# Patient Record
Sex: Female | Born: 1965 | Race: White | Hispanic: No | Smoking: Current every day smoker
Health system: Southern US, Community
[De-identification: ages and names within clinical notes are randomized; demographics above are authoritative.]

## PROBLEM LIST (undated history)

## (undated) DIAGNOSIS — F1020 Alcohol dependence, uncomplicated: Secondary | ICD-10-CM

## (undated) DIAGNOSIS — F329 Major depressive disorder, single episode, unspecified: Secondary | ICD-10-CM

## (undated) DIAGNOSIS — E079 Disorder of thyroid, unspecified: Secondary | ICD-10-CM

## (undated) DIAGNOSIS — K589 Irritable bowel syndrome without diarrhea: Secondary | ICD-10-CM

## (undated) DIAGNOSIS — F32A Depression, unspecified: Secondary | ICD-10-CM

## (undated) HISTORY — DX: Disorder of thyroid, unspecified: E07.9

---

## 2012-01-14 ENCOUNTER — Ambulatory Visit: Payer: Self-pay | Admitting: Emergency Medicine

## 2012-01-14 VITALS — BP 130/82 | HR 77 | Temp 97.4°F | Resp 16 | Ht 65.75 in | Wt 153.2 lb

## 2012-01-14 DIAGNOSIS — L259 Unspecified contact dermatitis, unspecified cause: Secondary | ICD-10-CM

## 2012-01-14 DIAGNOSIS — L0390 Cellulitis, unspecified: Secondary | ICD-10-CM

## 2012-01-14 DIAGNOSIS — R21 Rash and other nonspecific skin eruption: Secondary | ICD-10-CM

## 2012-01-14 MED ORDER — HYDROCORTISONE 1 % EX LOTN: TOPICAL_LOTION | Freq: Two times a day (BID) | CUTANEOUS | Status: AC

## 2012-01-14 MED ORDER — CEPHALEXIN 500 MG PO TABS: 500.0000 mg | ORAL_TABLET | Freq: Three times a day (TID) | ORAL | Status: AC

## 2012-01-14 MED ORDER — PREDNISONE 10 MG PO TABS: ORAL_TABLET | ORAL | Status: DC

## 2012-01-14 NOTE — Progress Notes (Signed)
  Subjective:    Patient ID: Victoria Travis, female    DOB: 1965/11/22, 46 y.o.   MRN: [id number]  HPI patient enters with a pruritic rash involving the nape of her neck she states it started about 10 days ago. She does not know of any new clothes or truly sure. She is put multiple different medications on this area without improvement. She went to [facility] for a few days and this seems to have made her feel better now her rash has recurred . She has extreme pruritus. She is worsening redness and irritation. She has some isolated areas on her arms    Review of Systems patient is on thyroid medications but not on other drugs     Objective:   Physical Exam  Skin:       Pertinent physical exam as related to the neck. There is redness scaling and irritation of the anterior portion of the neck. This extends up to the underside of the chin. There no areas involving the back, the trunk or other areas          Assessment & Plan:  Patient has a severe contact dermatitis involving her neck. She has made it worse by applying multiple different medications.

## 2012-01-18 LAB — WOUND CULTURE: Gram Stain: NONE SEEN

## 2012-02-28 ENCOUNTER — Ambulatory Visit: Payer: Self-pay | Admitting: Family Medicine

## 2012-02-28 VITALS — BP 114/74 | HR 79 | Temp 98.0°F | Resp 18 | Ht 65.75 in | Wt 153.0 lb

## 2012-02-28 DIAGNOSIS — R350 Frequency of micturition: Secondary | ICD-10-CM

## 2012-02-28 DIAGNOSIS — N390 Urinary tract infection, site not specified: Secondary | ICD-10-CM

## 2012-02-28 LAB — POCT URINALYSIS DIPSTICK
Bilirubin, UA: NEGATIVE
Leukocytes, UA: NEGATIVE
Nitrite, UA: NEGATIVE
Protein, UA: NEGATIVE
Urobilinogen, UA: 0.2
pH, UA: 7

## 2012-02-28 LAB — POCT UA - MICROSCOPIC ONLY: Yeast, UA: NEGATIVE

## 2012-02-28 MED ORDER — NITROFURANTOIN MONOHYD MACRO 100 MG PO CAPS: 100.0000 mg | ORAL_CAPSULE | Freq: Two times a day (BID) | ORAL | Status: AC

## 2012-02-28 MED ORDER — PHENAZOPYRIDINE HCL 200 MG PO TABS: 200.0000 mg | ORAL_TABLET | Freq: Three times a day (TID) | ORAL | Status: AC | PRN

## 2012-02-28 NOTE — Progress Notes (Signed)
  Subjective:    Patient ID: Victoria Travis, female    DOB: 1965-10-27, 46 y.o.   MRN: [id number]  HPI  Patient presents complaining of 3 week history of cloudy urine that has a strong ammonia odor.  Complains of urgency and frequency  No nausea or vomiting No fever or chills New body products No vaginal discharge No new sexual partners  Trying to get pregnant Review of Systems     Objective:   Physical Exam  Constitutional: She appears well-developed.  Neck: Neck supple.  Cardiovascular: Normal rate and regular rhythm.   Pulmonary/Chest: Effort normal and breath sounds normal.  Abdominal: There is Tenderness: suprapubic.Marland Kitchen  Neurological: She is alert.  Skin: Skin is warm.        Assessment & Plan:  Urinary urgency Hypothyroid   Culture urine Macrobid X 3 days Pyridium X 2 days Would like to establish care at 104; information booklet provided

## 2012-03-03 LAB — URINE CULTURE

## 2012-06-11 ENCOUNTER — Ambulatory Visit: Payer: Self-pay | Admitting: Family Medicine

## 2012-06-11 VITALS — BP 110/90 | HR 86 | Temp 98.0°F | Resp 16 | Ht 65.5 in | Wt 161.4 lb

## 2012-06-11 DIAGNOSIS — L03119 Cellulitis of unspecified part of limb: Secondary | ICD-10-CM | POA: Insufficient documentation

## 2012-06-11 DIAGNOSIS — E038 Other specified hypothyroidism: Secondary | ICD-10-CM

## 2012-06-11 DIAGNOSIS — E890 Postprocedural hypothyroidism: Secondary | ICD-10-CM

## 2012-06-11 DIAGNOSIS — E039 Hypothyroidism, unspecified: Secondary | ICD-10-CM

## 2012-06-11 DIAGNOSIS — L02619 Cutaneous abscess of unspecified foot: Secondary | ICD-10-CM

## 2012-06-11 HISTORY — DX: Hypothyroidism, unspecified: E03.9

## 2012-06-11 MED ORDER — DOXYCYCLINE HYCLATE 100 MG PO TABS: 100.0000 mg | ORAL_TABLET | Freq: Two times a day (BID) | ORAL | Status: AC

## 2012-06-11 NOTE — Progress Notes (Signed)
Victoria Travis is a 46 y.o. female who presents to [facility] today for possible infection of the foot. 9 days ago the patient was stung in the foot by a wasp or ant. She noted immediate pain and swelling. Over the past several days she has had redness of the foot that is worsening. She denies any fevers or chills numbness or weakness of the foot.  She feels well otherwise.  No history of prior foot infections.  Additionally she notes that she is overdue for her thyroid panel evaluation. She has hypothyroidism following ablation of hyperthyroidism. She feels well without any hot or cold intolerance.   PMH: Reviewed significant for hypothyroidism status post hyperthyroidism History  Substance Use Topics  . Smoking status: Never Smoker   . Smokeless tobacco: Not on file  . Alcohol Use: Yes     beers nightly   ROS as above  Medications reviewed. Current Outpatient Prescriptions  Medication Sig Dispense Refill  . levothyroxine (SYNTHROID, LEVOTHROID) 200 MCG tablet Take 200 mcg by mouth daily. Pt states she is taking 1 1/2 tablets daily      . doxycycline (VIBRA-TABS) 100 MG tablet Take 1 tablet (100 mg total) by mouth 2 (two) times daily.  20 tablet  0  . hydrocortisone 1 % lotion Apply topically 2 (two) times daily.  118 mL  0  . predniSONE (DELTASONE) 10 MG tablet 3 a day for 3 days 2 a day for 3 days one a day for 2 days  18 tablet  0    Exam:  BP 110/90  Pulse 86  Temp 98 F (36.7 C) (Oral)  Resp 16  Ht 5' 5.5" (1.664 m)  Wt 161 lb 6.4 oz (73.211 kg)  BMI 26.45 kg/m2  SpO2 98% Gen: Well NAD HEENT: EOMI,  MMM Lungs: CTABL Nl WOB Heart: RRR no MRG Abd: NABS, NT, ND Exts: Non edematous BL  LE, warm and well perfused.  Skin: Erythematous area on the medial aspect of the right foot.  Not significantly tender. No induration or fluctuance. Normal foot motion sensation and capillary refill and pulses.    No results found for this or any previous visit (from the past 72  hour(s)).  Assessment and Plan: 46 y.o. female with   1) possible cellulitis the foot secondary to insect bite or standing. Plan to treat with doxycycline for 10 days.  Discussed warning signs or symptoms. Please see discharge instructions. Patient expresses understanding.  2) hypothyroidism. Currently on Synthroid. Plan to check TSH and free T4 today as she is overdue this is convenient for her.

## 2012-06-11 NOTE — Patient Instructions (Addendum)
Thank you for coming in today. 1) foot: Take the doxycycline twice a day for 10 days. Be careful for sensitivity to sun. Come back if not improving or worsening or with fevers or chills.  2) thyroid: Were drawn labs today and will be following her thyroid level.  Come back as needed.

## 2012-06-17 ENCOUNTER — Encounter: Payer: Self-pay | Admitting: *Deleted

## 2012-08-11 ENCOUNTER — Ambulatory Visit: Payer: Self-pay | Admitting: Family Medicine

## 2012-08-11 VITALS — BP 130/83 | HR 71 | Temp 98.7°F | Resp 16 | Ht 66.0 in | Wt 163.2 lb

## 2012-08-11 DIAGNOSIS — E039 Hypothyroidism, unspecified: Secondary | ICD-10-CM

## 2012-08-11 MED ORDER — LEVOTHYROXINE SODIUM 125 MCG PO TABS: 125.0000 ug | ORAL_TABLET | Freq: Every day | ORAL | Status: DC

## 2012-08-11 NOTE — Progress Notes (Signed)
46 yo woman with hypothyroidism who is now taking 150 mcg Synthroid daily (she increased the dose on her own).  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

## 2013-08-26 ENCOUNTER — Other Ambulatory Visit: Payer: Self-pay | Admitting: Family Medicine

## 2013-09-29 ENCOUNTER — Other Ambulatory Visit: Payer: Self-pay | Admitting: Physician Assistant

## 2013-09-30 ENCOUNTER — Other Ambulatory Visit: Payer: Self-pay | Admitting: Physician Assistant

## 2013-09-30 ENCOUNTER — Ambulatory Visit: Payer: Self-pay | Admitting: Family Medicine

## 2013-09-30 VITALS — BP 126/74 | HR 70 | Temp 98.8°F | Resp 18 | Ht 66.0 in | Wt 170.0 lb

## 2013-09-30 DIAGNOSIS — E039 Hypothyroidism, unspecified: Secondary | ICD-10-CM

## 2013-09-30 MED ORDER — LEVOTHYROXINE SODIUM 125 MCG PO TABS: 125.0000 ug | ORAL_TABLET | Freq: Every day | ORAL | Status: DC

## 2013-09-30 NOTE — Progress Notes (Signed)
Chief Complaint:  Chief Complaint  Patient presents with  . rx refills    levothyroxine    HPI: Victoria Travis is a 47 y.o. female who is here for  Thyroid medication refill. She is compliant with meds She is not havng any SEs. Last dose was yesterday. No cp, palpitations, weight gain/loss. Denies emotional labileness.  She does not come in for checkups every 6 months since she does not have any insurance, if there were any changes then she would come in but she cannot afford to come in every 6 months.   Past Medical History  Diagnosis Date  . Thyroid disease    History reviewed. No pertinent past surgical history. History   Social History  . Marital Status: Married    Spouse Name: N/A    Number of Children: N/A  . Years of Education: N/A   Social History Main Topics  . Smoking status: Former Games developer  . Smokeless tobacco: None  . Alcohol Use: Yes     Comment: beers nightly  . Drug Use: No  . Sexual Activity: Yes   Other Topics Concern  . None   Social History Narrative  . None   History reviewed. No pertinent family history. No Known Allergies Prior to Admission medications   Medication Sig Start Date End Date Taking? Authorizing Provider  levothyroxine (SYNTHROID, LEVOTHROID) 125 MCG tablet Take 1 tablet (125 mcg total) by mouth daily before breakfast. PATIENT NEEDS OFFICE VISIT FOR ADDITIONAL REFILLS 08/26/13  Yes Ryan M Dunn, PA-C  predniSONE (DELTASONE) 10 MG tablet 3 a day for 3 days 2 a day for 3 days one a day for 2 days 01/14/12   Collene Gobble, MD     ROS: The patient denies fevers, chills, night sweats, unintentional weight loss, chest pain, palpitations, wheezing, dyspnea on exertion, nausea, vomiting, abdominal pain, dysuria, hematuria, melena, numbness, weakness, or tingling.   All other systems have been reviewed and were otherwise negative with the exception of those mentioned in the HPI and as above.    PHYSICAL EXAM: Filed Vitals:   09/30/13 1704  BP: 126/74  Pulse: 70  Temp: 98.8 F (37.1 C)  Resp: 18   Filed Vitals:   09/30/13 1704  Height: 5\' 6"  (1.676 m)  Weight: 170 lb (77.111 kg)   Body mass index is 27.45 kg/(m^2).  General: Alert, no acute distress HEENT:  Normocephalic, atraumatic, oropharynx patent. EOMI, PERRLA, no thyroidmegaly Cardiovascular:  Regular rate and rhythm, no rubs murmurs or gallops.  No Carotid bruits, radial pulse intact. No pedal edema.  Respiratory: Clear to auscultation bilaterally.  No wheezes, rales, or rhonchi.  No cyanosis, no use of accessory musculature GI: No organomegaly, abdomen is soft and non-tender, positive bowel sounds.  No masses. Skin: No rashes. Neurologic: Facial musculature symmetric. Psychiatric: Patient is appropriate throughout our interaction. Lymphatic: No cervical lymphadenopathy Musculoskeletal: Gait intact.   LABS: Results for orders placed in visit on 06/11/12  T4, FREE      Result Value Range   Free T4 1.66  0.80 - 1.80 ng/dL  TSH      Result Value Range   TSH 0.037 (*) 0.350 - 4.500 uIU/mL     EKG/XRAY:   Primary read interpreted by Dr. Conley Rolls at [facility].   ASSESSMENT/PLAN: Encounter Diagnosis  Name Primary?  Marland Kitchen Unspecified hypothyroidism Yes   Refilled for 1 year TSH pending F/u in 6 months  Gross sideeffects, risk and benefits, and alternatives of medications  d/w patient. Patient is aware that all medications have potential sideeffects and we are unable to predict every sideeffect or drug-drug interaction that may occur.  Hamilton Capri PHUONG, DO 09/30/2013 6:07 PM

## 2013-10-01 LAB — TSH: TSH: 0.201 u[IU]/mL — ABNORMAL LOW (ref 0.350–4.500)

## 2013-10-08 ENCOUNTER — Telehealth: Payer: Self-pay | Admitting: Family Medicine

## 2013-10-08 NOTE — Telephone Encounter (Signed)
LM about TSH results, need to call me. LM with my phone number.

## 2013-10-11 ENCOUNTER — Other Ambulatory Visit: Payer: Self-pay | Admitting: Family Medicine

## 2013-10-11 DIAGNOSIS — E039 Hypothyroidism, unspecified: Secondary | ICD-10-CM

## 2013-10-11 NOTE — Telephone Encounter (Addendum)
Spoke with patient, unable to add free t4 or total t3 since outside of 1 week window when she called me back to add on test from prior blood draw. She does not have money right now to get blood work doen so will come in 1 month to get rechecked. I will not need to see her, she will call me to inform me when she gets it done. This will be a lab visit only--no charge if possible. Currently asymptomatic. She is on 125 mcg of Levothyroxine.

## 2013-12-28 ENCOUNTER — Ambulatory Visit (INDEPENDENT_AMBULATORY_CARE_PROVIDER_SITE_OTHER): Payer: BC Managed Care – PPO | Admitting: Emergency Medicine

## 2013-12-28 VITALS — BP 140/92 | HR 87 | Temp 98.7°F | Resp 16 | Ht 66.0 in | Wt 170.0 lb

## 2013-12-28 DIAGNOSIS — J309 Allergic rhinitis, unspecified: Secondary | ICD-10-CM

## 2013-12-28 DIAGNOSIS — E039 Hypothyroidism, unspecified: Secondary | ICD-10-CM

## 2013-12-28 LAB — TSH: TSH: 0.198 u[IU]/mL — AB (ref 0.350–4.500)

## 2013-12-28 LAB — T4: T4 TOTAL: 8.4 ug/dL (ref 5.0–12.5)

## 2013-12-28 LAB — T3 UPTAKE: T3 UPTAKE: 35.7 % (ref 22.5–37.0)

## 2013-12-28 LAB — T4, FREE: Free T4: 1.34 ng/dL (ref 0.80–1.80)

## 2013-12-28 MED ORDER — PSEUDOEPHEDRINE-GUAIFENESIN ER 60-600 MG PO TB12: 1.0000 | ORAL_TABLET | Freq: Two times a day (BID) | ORAL | Status: DC

## 2013-12-28 MED ORDER — TRIAMCINOLONE ACETONIDE 55 MCG/ACT NA AERO: 2.0000 | INHALATION_SPRAY | Freq: Every day | NASAL | Status: DC

## 2013-12-28 NOTE — Progress Notes (Signed)
[facility] [address], [place] [id number] [phone]  Date:  12/28/2013   Name:  Victoria DecampKaren Olazabal   DOB:  January 31, 1966   MRN:  [id number]  PCP:  No primary provider on file.    Chief Complaint: Otalgia, Sore Throat, Cough and Lab work   History of Present Illness:  Victoria DecampKaren Diaz is a 48 y.o. very pleasant female patient who presents with the following:  Has week long duration of nasal congestion with a clear watery nasal drainage, sore throat, non productive cough and pressure in her ears.  No fever or chills.  No wheezing or shortness of breath.  No nausea or vomiting.  No history of seasonal allergic rhinitis.  No improvement with over the counter medications or other home remedies. Denies other complaint or health concern today.   Patient Active Problem List   Diagnosis Date Noted  . 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 Cellulitis of foot 06/11/2012    Past Medical History  Diagnosis Date  . Thyroid disease     History reviewed. No pertinent past surgical history.  History  Substance Use Topics  . Smoking status: Former Games developermoker  . Smokeless tobacco: Not on file  . Alcohol Use: Yes     Comment: beers nightly    History reviewed. No pertinent family history.  No Known Allergies  Medication list has been reviewed and updated.  Current Outpatient Prescriptions on File Prior to Visit  Medication Sig Dispense Refill  . levothyroxine (SYNTHROID, LEVOTHROID) 125 MCG tablet Take 1 tablet (125 mcg total) by mouth daily before breakfast.  90 tablet  3   No current facility-administered medications on file prior to visit.    Review of Systems:  As per HPI, otherwise negative.    Physical Examination: Filed Vitals:   12/28/13 1252  BP: 140/92  Pulse: 87  Temp: 98.7 F (37.1 C)  Resp: 16   Filed Vitals:   12/28/13 1252  Height: 5\' 6"  (1.676 m)  Weight: 170 lb (77.111 kg)   Body mass index is 27.45 kg/(m^2). Ideal Body Weight:  Weight in (lb) to have BMI = 25: 154.6  GEN: WDWN, NAD, Non-toxic, A & O x 3 HEENT: Atraumatic, Normocephalic. Neck supple. No masses, No LAD. Ears and Nose: No external deformity. CV: RRR, No M/G/R. No JVD. No thrill. No extra heart sounds. PULM: CTA B, no wheezes, crackles, rhonchi. No retractions. No resp. distress. No accessory muscle use. ABD: S, NT, ND, +BS. No rebound. No HSM. EXTR: No c/c/e NEURO Normal gait.  PSYCH: Normally interactive. Conversant. Not depressed or anxious appearing.  Calm demeanor.    Assessment and Plan: Seasonal allergic rhinitis Eustachian tube dysfunction mucinex d nasacort   Signed,  Phillips OdorJeffery Aalaysia Liggins, MD

## 2013-12-28 NOTE — Patient Instructions (Signed)

## 2014-04-04 ENCOUNTER — Ambulatory Visit (INDEPENDENT_AMBULATORY_CARE_PROVIDER_SITE_OTHER): Payer: BC Managed Care – PPO

## 2014-04-04 ENCOUNTER — Ambulatory Visit (INDEPENDENT_AMBULATORY_CARE_PROVIDER_SITE_OTHER): Payer: BC Managed Care – PPO | Admitting: Family Medicine

## 2014-04-04 VITALS — BP 120/90 | HR 86 | Temp 98.5°F | Resp 16 | Ht 65.63 in | Wt 173.0 lb

## 2014-04-04 DIAGNOSIS — G4700 Insomnia, unspecified: Secondary | ICD-10-CM

## 2014-04-04 DIAGNOSIS — M25579 Pain in unspecified ankle and joints of unspecified foot: Secondary | ICD-10-CM

## 2014-04-04 DIAGNOSIS — L03116 Cellulitis of left lower limb: Secondary | ICD-10-CM

## 2014-04-04 DIAGNOSIS — M25572 Pain in left ankle and joints of left foot: Secondary | ICD-10-CM

## 2014-04-04 DIAGNOSIS — L02419 Cutaneous abscess of limb, unspecified: Secondary | ICD-10-CM

## 2014-04-04 DIAGNOSIS — L03119 Cellulitis of unspecified part of limb: Secondary | ICD-10-CM

## 2014-04-04 DIAGNOSIS — Z23 Encounter for immunization: Secondary | ICD-10-CM

## 2014-04-04 MED ORDER — HYDROCODONE-ACETAMINOPHEN 5-325 MG PO TABS: 1.0000 | ORAL_TABLET | Freq: Four times a day (QID) | ORAL | Status: DC | PRN

## 2014-04-04 MED ORDER — AMOXICILLIN-POT CLAVULANATE 875-125 MG PO TABS: 1.0000 | ORAL_TABLET | Freq: Two times a day (BID) | ORAL | Status: DC

## 2014-04-04 MED ORDER — TRAZODONE HCL 50 MG PO TABS: 25.0000 mg | ORAL_TABLET | Freq: Every evening | ORAL | Status: DC | PRN

## 2014-04-04 NOTE — Progress Notes (Signed)
The chart was scribed for Elvina SidleKurt Lauenstein, MD, by Yevette EdwardsAngela Bracken, ED Scribe. This patient's care was started at 3:43 PM.    Patient ID: Victoria DecampKaren Travis MRN: [id number], DOB: January 18, 1966, 48 y.o. Date of Encounter: 04/04/2014, 3:43 PM  Primary Physician: No primary provider on file.  Chief Complaint:  Chief Complaint  Patient presents with   Laceration    left foot-hit by tree limb last Saturday afternoon     HPI: 48 y.o. year old female with history below presents with pain to the dorsum of her left foot which began last week after a tree limb fell upon it. She characterizes the pain as "burning," and she reports the pain is increased with ambulation and pressure.  She denies pain to the lateral aspect of her foot or to her calfThe pt has also experienced swelling, redness, and bruising to the site.. She cannot recall what shoes, if any, she was wearing. She has used ice and elevation without resolution. She cannot recall her last tetanus.  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    Past Medical History  Diagnosis Date   Thyroid disease      Home Meds: Prior to Admission medications   Medication Sig Start Date End Date Taking? Authorizing Provider  levothyroxine (SYNTHROID, LEVOTHROID) 125 MCG tablet Take 1 tablet (125 mcg total) by mouth daily before breakfast. 09/30/13  Yes Thao P Le, DO    Allergies: No Known Allergies  History   Social History   Marital Status: Married    Spouse Name: N/A    Number of Children: N/A   Years of Education: N/A   Occupational History   Not on file.   Social History Main Topics   Smoking status: Former Smoker   Smokeless tobacco: Never Used   Alcohol Use: Yes     Comment: beers nightly   Drug Use: No   Sexual Activity: Yes   Other Topics Concern   Not on file   Social History Narrative   No narrative on file     Review of Systems: Constitutional: negative for chills, fever, night sweats, weight  changes, or fatigue  HEENT: negative for vision changes, hearing loss, congestion, rhinorrhea, ST, epistaxis, or sinus pressure Cardiovascular: negative for chest pain or palpitations Respiratory: negative for hemoptysis, wheezing, shortness of breath, or cough Abdominal: negative for abdominal pain, nausea, vomiting, diarrhea, or constipation Msk: positive left foot pain Dermatological: negative for rash Neurologic: negative for headache, dizziness, or syncope All other systems reviewed and are otherwise negative with the exception to those above and in the HPI.   Physical Exam: Blood pressure 120/90, pulse 86, temperature 98.5 F (36.9 C), temperature source Oral, resp. rate 16, height 5' 5.63" (1.667 m), weight 173 lb (78.472 kg), last menstrual period 03/07/2014, SpO2 97.00%., Body mass index is 28.24 kg/(m^2). General: Well developed, well nourished, in no acute distress. Head: Normocephalic, atraumatic, eyes without discharge, sclera non-icteric, nares are without discharge. Bilateral auditory canals clear, TM's are without perforation, pearly grey and translucent with reflective cone of light bilaterally. Oral cavity moist, posterior pharynx without exudate, erythema, peritonsillar abscess, or post nasal drip.  Neck: Supple. No thyromegaly. Full ROM. No lymphadenopathy. Lungs: Clear bilaterally to auscultation without wheezes, rales, or rhonchi. Breathing is unlabored. Heart: RRR with S1 S2. No murmurs, rubs, or gallops appreciated. Abdomen: Soft, non-tender, non-distended with normoactive bowel sounds. No hepatomegaly. No rebound/guarding. No obvious abdominal masses. Msk:  Strength and tone normal for age. Extremities/Skin:  Warm and dry. No clubbing or cyanosis. No edema. No rashes or suspicious lesions. Neuro: Alert and oriented X 3. Moves all extremities spontaneously. Gait is normal. CNII-XII grossly in tact. Psych:  Responds to questions appropriately with a normal affect.    [facility] reading (PRIMARY) by Dr. Milus GlazierLauenstein: left foot; soft-tissue swelling only     ASSESSMENT AND PLAN:  48 y.o. year old female with Pain in joint, ankle and foot, left - Plan: DG Foot Complete Left, Tdap vaccine greater than or equal to 7yo IM, amoxicillin-clavulanate (AUGMENTIN) 875-125 MG per tablet, HYDROcodone-acetaminophen (NORCO) 5-325 MG per tablet  Cellulitis of left lower extremity - Plan: amoxicillin-clavulanate (AUGMENTIN) 875-125 MG per tablet, HYDROcodone-acetaminophen (NORCO) 5-325 MG per tablet  Insomnia - Plan: traZODone (DESYREL) 50 MG tablet     Signed, Elvina SidleKurt Lauenstein, MD 04/04/2014 3:43 PM

## 2014-04-04 NOTE — Patient Instructions (Addendum)
Results for orders placed in visit on 12/28/13  TSH      Result Value Ref Range   TSH 0.198 (*) 0.350 - 4.500 uIU/mL  T3 UPTAKE      Result Value Ref Range   T3 Uptake 35.7  22.5 - 37.0 %  T4      Result Value Ref Range   T4, Total 8.4  5.0 - 12.5 ug/dL  T4, FREE      Result Value Ref Range   Free T4 1.34  0.80 - 1.80 ng/dL       Insomnia Insomnia is frequent trouble falling and/or staying asleep. Insomnia can be a long term problem or a short term problem. Both are common. Insomnia can be a short term problem when the wakefulness is related to a certain stress or worry. Long term insomnia is often related to ongoing stress during waking hours and/or poor sleeping habits. Overtime, sleep deprivation itself can make the problem worse. Every little thing feels more severe because you are overtired and your ability to cope is decreased. CAUSES   Stress, anxiety, and depression.  Poor sleeping habits.  Distractions such as TV in the bedroom.  Naps close to bedtime.  Engaging in emotionally charged conversations before bed.  Technical reading before sleep.  Alcohol and other sedatives. They may make the problem worse. They can hurt normal sleep patterns and normal dream activity.  Stimulants such as caffeine for several hours prior to bedtime.  Pain syndromes and shortness of breath can cause insomnia.  Exercise late at night.  Changing time zones may cause sleeping problems (jet lag). It is sometimes helpful to have someone observe your sleeping patterns. They should look for periods of not breathing during the night (sleep apnea). They should also look to see how long those periods last. If you live alone or observers are uncertain, you can also be observed at a sleep clinic where your sleep patterns will be professionally monitored. Sleep apnea requires a checkup and treatment. Give your caregivers your medical history. Give your caregivers observations your family has made  about your sleep.  SYMPTOMS   Not feeling rested in the morning.  Anxiety and restlessness at bedtime.  Difficulty falling and staying asleep. TREATMENT   Your caregiver may prescribe treatment for an underlying medical disorders. Your caregiver can give advice or help if you are using alcohol or other drugs for self-medication. Treatment of underlying problems will usually eliminate insomnia problems.  Medications can be prescribed for short time use. They are generally not recommended for lengthy use.  Over-the-counter sleep medicines are not recommended for lengthy use. They can be habit forming.  You can promote easier sleeping by making lifestyle changes such as:  Using relaxation techniques that help with breathing and reduce muscle tension.  Exercising earlier in the day.  Changing your diet and the time of your last meal. No night time snacks.  Establish a regular time to go to bed.  Counseling can help with stressful problems and worry.  Soothing music and white noise may be helpful if there are background noises you cannot remove.  Stop tedious detailed work at least one hour before bedtime. HOME CARE INSTRUCTIONS   Keep a diary. Inform your caregiver about your progress. This includes any medication side effects. See your caregiver regularly. Take note of:  Times when you are asleep.  Times when you are awake during the night.  The quality of your sleep.  How you feel the next  day. This information will help your caregiver care for you.  Get out of bed if you are still awake after 15 minutes. Read or do some quiet activity. Keep the lights down. Wait until you feel sleepy and go back to bed.  Keep regular sleeping and waking hours. Avoid naps.  Exercise regularly.  Avoid distractions at bedtime. Distractions include watching television or engaging in any intense or detailed activity like attempting to balance the household checkbook.  Develop a bedtime  ritual. Keep a familiar routine of bathing, brushing your teeth, climbing into bed at the same time each night, listening to soothing music. Routines increase the success of falling to sleep faster.  Use relaxation techniques. This can be using breathing and muscle tension release routines. It can also include visualizing peaceful scenes. You can also help control troubling or intruding thoughts by keeping your mind occupied with boring or repetitive thoughts like the old concept of counting sheep. You can make it more creative like imagining planting one beautiful flower after another in your backyard garden.  During your day, work to eliminate stress. When this is not possible use some of the previous suggestions to help reduce the anxiety that accompanies stressful situations. MAKE SURE YOU:   Understand these instructions.  Will watch your condition.  Will get help right away if you are not doing well or get worse. Document Released: 09/22/2000 Document Revised: 12/18/2011 Document Reviewed: 10/23/2007 [facility] Patient Information 2015 [place], [place]. This information is not intended to replace advice given to you by your health care provider. Make sure you discuss any questions you have with your health care provider. Cellulitis Cellulitis is an infection of the skin and the tissue beneath it. The infected area is usually red and tender. Cellulitis occurs most often in the arms and lower legs.  CAUSES  Cellulitis is caused by bacteria that enter the skin through cracks or cuts in the skin. The most common types of bacteria that cause cellulitis are Staphylococcus and Streptococcus. SYMPTOMS   Redness and warmth.  Swelling.  Tenderness or pain.  Fever. DIAGNOSIS  Your caregiver can usually determine what is wrong based on a physical exam. Blood tests may also be done. TREATMENT  Treatment usually involves taking an antibiotic medicine. HOME CARE INSTRUCTIONS   Take your  antibiotics as directed. Finish them even if you start to feel better.  Keep the infected arm or leg elevated to reduce swelling.  Apply a warm cloth to the affected area up to 4 times per day to relieve pain.  Only take over-the-counter or prescription medicines for pain, discomfort, or fever as directed by your caregiver.  Keep all follow-up appointments as directed by your caregiver. SEEK MEDICAL CARE IF:   You notice red streaks coming from the infected area.  Your red area gets larger or turns dark in color.  Your bone or joint underneath the infected area becomes painful after the skin has healed.  Your infection returns in the same area or another area.  You notice a swollen bump in the infected area.  You develop new symptoms. SEEK IMMEDIATE MEDICAL CARE IF:   You have a fever.  You feel very sleepy.  You develop vomiting or diarrhea.  You have a general ill feeling (malaise) with muscle aches and pains. MAKE SURE YOU:   Understand these instructions.  Will watch your condition.  Will get help right away if you are not doing well or get worse. Document Released: 07/05/2005 Document Revised:  03/26/2012 Document Reviewed: 12/11/2011 ExitCare Patient Information 2015 [place], [place]. This information is not intended to replace advice given to you by your health care provider. Make sure you discuss any questions you have with your health care provider.

## 2014-11-24 ENCOUNTER — Other Ambulatory Visit: Payer: Self-pay | Admitting: Family Medicine

## 2014-11-26 ENCOUNTER — Other Ambulatory Visit: Payer: Self-pay | Admitting: Family Medicine

## 2014-12-23 ENCOUNTER — Ambulatory Visit (INDEPENDENT_AMBULATORY_CARE_PROVIDER_SITE_OTHER): Payer: BLUE CROSS/BLUE SHIELD | Admitting: Emergency Medicine

## 2014-12-23 VITALS — BP 136/78 | HR 74 | Temp 98.4°F | Resp 17 | Ht 60.75 in | Wt 171.0 lb

## 2014-12-23 DIAGNOSIS — B351 Tinea unguium: Secondary | ICD-10-CM | POA: Diagnosis not present

## 2014-12-23 DIAGNOSIS — E039 Hypothyroidism, unspecified: Secondary | ICD-10-CM

## 2014-12-23 DIAGNOSIS — G4733 Obstructive sleep apnea (adult) (pediatric): Secondary | ICD-10-CM | POA: Diagnosis not present

## 2014-12-23 MED ORDER — TERBINAFINE HCL 250 MG PO TABS: 250.0000 mg | ORAL_TABLET | Freq: Every day | ORAL | Status: DC

## 2014-12-23 NOTE — Patient Instructions (Signed)
Sleep Apnea  Sleep apnea is a sleep disorder characterized by abnormal pauses in breathing while you sleep. When your breathing pauses, the level of oxygen in your blood decreases. This causes you to move out of deep sleep and into light sleep. As a result, your quality of sleep is poor, and the system that carries your blood throughout your body (cardiovascular system) experiences stress. If sleep apnea remains untreated, the following conditions can develop:  High blood pressure (hypertension).  Coronary artery disease.  Inability to achieve or maintain an erection (impotence).  Impairment of your thought process (cognitive dysfunction). There are three types of sleep apnea: 1. Obstructive sleep apnea--Pauses in breathing during sleep because of a blocked airway. 2. Central sleep apnea--Pauses in breathing during sleep because the area of the brain that controls your breathing does not send the correct signals to the muscles that control breathing. 3. Mixed sleep apnea--A combination of both obstructive and central sleep apnea. RISK FACTORS The following risk factors can increase your risk of developing sleep apnea:  Being overweight.  Smoking.  Having narrow passages in your nose and throat.  Being of older age.  Being female.  Alcohol use.  Sedative and tranquilizer use.  Ethnicity. Among individuals younger than 35 years, African Americans are at increased risk of sleep apnea. SYMPTOMS   Difficulty staying asleep.  Daytime sleepiness and fatigue.  Loss of energy.  Irritability.  Loud, heavy snoring.  Morning headaches.  Trouble concentrating.  Forgetfulness.  Decreased interest in sex. DIAGNOSIS  In order to diagnose sleep apnea, your caregiver will perform a physical examination. Your caregiver may suggest that you take a home sleep test. Your caregiver may also recommend that you spend the night in a sleep lab. In the sleep lab, several monitors record  information about your heart, lungs, and brain while you sleep. Your leg and arm movements and blood oxygen level are also recorded. TREATMENT The following actions may help to resolve mild sleep apnea:  Sleeping on your side.   Using a decongestant if you have nasal congestion.   Avoiding the use of depressants, including alcohol, sedatives, and narcotics.   Losing weight and modifying your diet if you are overweight. There also are devices and treatments to help open your airway:  Oral appliances. These are custom-made mouthpieces that shift your lower jaw forward and slightly open your bite. This opens your airway.  Devices that create positive airway pressure. This positive pressure "splints" your airway open to help you breathe better during sleep. The following devices create positive airway pressure:  Continuous positive airway pressure (CPAP) device. The CPAP device creates a continuous level of air pressure with an air pump. The air is delivered to your airway through a mask while you sleep. This continuous pressure keeps your airway open.  Nasal expiratory positive airway pressure (EPAP) device. The EPAP device creates positive air pressure as you exhale. The device consists of single-use valves, which are inserted into each nostril and held in place by adhesive. The valves create very little resistance when you inhale but create much more resistance when you exhale. That increased resistance creates the positive airway pressure. This positive pressure while you exhale keeps your airway open, making it easier to breath when you inhale again.  Bilevel positive airway pressure (BPAP) device. The BPAP device is used mainly in patients with central sleep apnea. This device is similar to the CPAP device because it also uses an air pump to deliver continuous air pressure   through a mask. However, with the BPAP machine, the pressure is set at two different levels. The pressure when you  exhale is lower than the pressure when you inhale.  Surgery. Typically, surgery is only done if you cannot comply with less invasive treatments or if the less invasive treatments do not improve your condition. Surgery involves removing excess tissue in your airway to create a wider passage way. Document Released: 09/15/2002 Document Revised: 01/20/2013 Document Reviewed: 02/01/2012 [employer] Patient Information 2015 [employer]. This information is not intended to replace advice given to you by your health care provider. Make sure you discuss any questions you have with your health care provider.  

## 2014-12-23 NOTE — Progress Notes (Signed)
[facility] [address], [place] [id number] [phone]  Date:  12/23/2014   Name:  Victoria DecampKaren Pizzolato   DOB:  1966/04/12   MRN:  [id number]  PCP:  No primary care provider on file.    Chief Complaint: Medication Refill; Mass; Pruritis; and Nails   History of Present Illness:  Victoria Travis is a 49 y.o. very pleasant female patient who presents with the following:  Multiple complaints.  Has hypothyroidism (acquired) and is nearly out of her medications.  Tolerating current dose with stable weight and energy Has nail disease in feet.  Now nails are delaminating and shedding. Lump (painless) on left abdominal wall over past several months Snores and has daytime sleepiness No improvement with over the counter medications or other home remedies.  Denies other complaint or health concern today.   Patient Active Problem List   Diagnosis Date Noted  . 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 Cellulitis of foot 06/11/2012    Past Medical History  Diagnosis Date  . Thyroid disease     History reviewed. No pertinent past surgical history.  History  Substance Use Topics  . Smoking status: Former Games developermoker  . Smokeless tobacco: Never Used  . Alcohol Use: Yes     Comment: beers nightly    History reviewed. No pertinent family history.  No Known Allergies  Medication list has been reviewed and updated.  Current Outpatient Prescriptions on File Prior to Visit  Medication Sig Dispense Refill  . levothyroxine (SYNTHROID, LEVOTHROID) 125 MCG tablet TAKE ONE TABLET BY MOUTH ONCE DAILY BEFORE BREAKFAST "OV NEEDED FOR ADDITIONAL REFILLS" 30 tablet 0  . amoxicillin-clavulanate (AUGMENTIN) 875-125 MG per tablet Take 1 tablet by mouth 2 (two) times daily. (Patient not taking: Reported on 12/23/2014) 20 tablet 0  . HYDROcodone-acetaminophen (NORCO) 5-325 MG per tablet Take 1 tablet by mouth every 6 (six) hours as needed for moderate pain. (Patient not taking:  Reported on 12/23/2014) 15 tablet 0  . traZODone (DESYREL) 50 MG tablet Take 0.5-1 tablets (25-50 mg total) by mouth at bedtime as needed for sleep. (Patient not taking: Reported on 12/23/2014) 30 tablet 5   No current facility-administered medications on file prior to visit.    Review of Systems:  As per HPI, otherwise negative.    Physical Examination: Filed Vitals:   12/23/14 1605  BP: 136/78  Pulse: 74  Temp: 98.4 F (36.9 C)  Resp: 17   Filed Vitals:   12/23/14 1605  Height: 5' 0.75" (1.543 m)  Weight: 171 lb (77.565 kg)   Body mass index is 32.58 kg/(m^2). Ideal Body Weight: Weight in (lb) to have BMI = 25: 131   GEN: WDWN, NAD, Non-toxic, Alert & Oriented x 3 HEENT: Atraumatic, Normocephalic.  Ears and Nose: No external deformity. EXTR: No clubbing/cyanosis/edema NEURO: Normal gait.  PSYCH: Normally interactive. Conversant. Not depressed or anxious appearing.  Calm demeanor.  SKIN:  1 cm diameter mobile mass at costal margin left abdomen NAILS:  Onychomycosis   Assessment and Plan: Onychomycosis Sebaceous cyst Acquired hypothyroidism Sleep apnea   Signed,  Phillips OdorJeffery Talea Manges, MD

## 2014-12-23 NOTE — Addendum Note (Signed)
Addended by: Maryann AlarBURNS, Garron Eline M on: 12/23/2014 05:31 PM   Modules accepted: Orders

## 2014-12-24 ENCOUNTER — Other Ambulatory Visit: Payer: Self-pay | Admitting: Emergency Medicine

## 2014-12-24 LAB — HEPATIC FUNCTION PANEL
ALBUMIN: 4.3 g/dL (ref 3.5–5.2)
ALK PHOS: 57 U/L (ref 39–117)
ALT: 50 U/L — ABNORMAL HIGH (ref 0–35)
AST: 52 U/L — ABNORMAL HIGH (ref 0–37)
Bilirubin, Direct: 0.1 mg/dL (ref 0.0–0.3)
Indirect Bilirubin: 0.2 mg/dL (ref 0.2–1.2)
TOTAL PROTEIN: 7 g/dL (ref 6.0–8.3)
Total Bilirubin: 0.3 mg/dL (ref 0.2–1.2)

## 2014-12-24 LAB — TSH: TSH: 0.91 u[IU]/mL (ref 0.350–4.500)

## 2014-12-24 MED ORDER — LEVOTHYROXINE SODIUM 125 MCG PO TABS: ORAL_TABLET | ORAL | Status: DC

## 2015-01-23 ENCOUNTER — Other Ambulatory Visit (INDEPENDENT_AMBULATORY_CARE_PROVIDER_SITE_OTHER): Payer: BLUE CROSS/BLUE SHIELD | Admitting: *Deleted

## 2015-01-23 DIAGNOSIS — B351 Tinea unguium: Secondary | ICD-10-CM

## 2015-01-23 DIAGNOSIS — G4733 Obstructive sleep apnea (adult) (pediatric): Secondary | ICD-10-CM

## 2015-01-23 DIAGNOSIS — E039 Hypothyroidism, unspecified: Secondary | ICD-10-CM

## 2015-01-23 LAB — HEPATIC FUNCTION PANEL
ALK PHOS: 54 U/L (ref 39–117)
ALT: 37 U/L — ABNORMAL HIGH (ref 0–35)
AST: 41 U/L — ABNORMAL HIGH (ref 0–37)
Albumin: 3.8 g/dL (ref 3.5–5.2)
BILIRUBIN DIRECT: 0.1 mg/dL (ref 0.0–0.3)
BILIRUBIN INDIRECT: 0.2 mg/dL (ref 0.2–1.2)
TOTAL PROTEIN: 6.7 g/dL (ref 6.0–8.3)
Total Bilirubin: 0.3 mg/dL (ref 0.2–1.2)

## 2015-01-23 NOTE — Progress Notes (Signed)
Pt here for lab draw only  

## 2015-02-23 ENCOUNTER — Ambulatory Visit (INDEPENDENT_AMBULATORY_CARE_PROVIDER_SITE_OTHER): Payer: BLUE CROSS/BLUE SHIELD | Admitting: Emergency Medicine

## 2015-02-23 VITALS — BP 122/90 | HR 76 | Temp 98.4°F | Resp 14 | Ht 66.0 in | Wt 173.4 lb

## 2015-02-23 DIAGNOSIS — G5681 Other specified mononeuropathies of right upper limb: Secondary | ICD-10-CM | POA: Diagnosis not present

## 2015-02-23 DIAGNOSIS — Z79899 Other long term (current) drug therapy: Secondary | ICD-10-CM

## 2015-02-23 LAB — HEPATIC FUNCTION PANEL
ALK PHOS: 56 U/L (ref 39–117)
ALT: 35 U/L (ref 0–35)
AST: 38 U/L — ABNORMAL HIGH (ref 0–37)
Albumin: 3.8 g/dL (ref 3.5–5.2)
BILIRUBIN TOTAL: 0.3 mg/dL (ref 0.2–1.2)
Bilirubin, Direct: 0.1 mg/dL (ref 0.0–0.3)
Indirect Bilirubin: 0.2 mg/dL (ref 0.2–1.2)
Total Protein: 6.8 g/dL (ref 6.0–8.3)

## 2015-02-23 MED ORDER — TRIAMCINOLONE ACETONIDE 40 MG/ML IJ SUSP: 40.0000 mg | Freq: Once | INTRAMUSCULAR | Status: DC

## 2015-02-23 MED ORDER — CYCLOBENZAPRINE HCL 10 MG PO TABS: 10.0000 mg | ORAL_TABLET | Freq: Three times a day (TID) | ORAL | Status: DC | PRN

## 2015-02-23 MED ORDER — NAPROXEN SODIUM 550 MG PO TABS: 550.0000 mg | ORAL_TABLET | Freq: Two times a day (BID) | ORAL | Status: DC

## 2015-02-23 MED ORDER — HYDROCODONE-ACETAMINOPHEN 5-325 MG PO TABS: 1.0000 | ORAL_TABLET | ORAL | Status: DC | PRN

## 2015-02-23 NOTE — Progress Notes (Signed)
Subjective:  Patient ID: Victoria DecampKaren Travis, female    DOB: 1966-04-27  Age: 49 y.o. MRN: [id number]  CC: Shoulder Pain and Labs Only   HPI Victoria Travis presents for the patient has pain in her right posterior shoulder. She has experienced pain for the last several days. With no history of injury or overuse. The pain is nonradiating and is not associated with any neurologic symptoms. Aggravated by lifting or carrying objects or turning her head. She wants experienced similar pain in her right shoulder associated with sleeping on different mattress and pillow.  She has attempted to relieve her pain with use of over-the-counter ibuprofen with no success. She has no other complaint of injury or illness  Outpatient Prescriptions Prior to Visit  Medication Sig Dispense Refill  . levothyroxine (SYNTHROID, LEVOTHROID) 125 MCG tablet TAKE ONE TABLET BY MOUTH ONCE DAILY BEFORE BREAKFAST 30 tablet 12  . terbinafine (LAMISIL) 250 MG tablet Take 1 tablet (250 mg total) by mouth daily. 30 tablet 5  . amoxicillin-clavulanate (AUGMENTIN) 875-125 MG per tablet Take 1 tablet by mouth 2 (two) times daily. (Patient not taking: Reported on 12/23/2014) 20 tablet 0  . HYDROcodone-acetaminophen (NORCO) 5-325 MG per tablet Take 1 tablet by mouth every 6 (six) hours as needed for moderate pain. (Patient not taking: Reported on 12/23/2014) 15 tablet 0  . traZODone (DESYREL) 50 MG tablet Take 0.5-1 tablets (25-50 mg total) by mouth at bedtime as needed for sleep. (Patient not taking: Reported on 12/23/2014) 30 tablet 5   No facility-administered medications prior to visit.    ROS Review of Systems   Constitutional: Negative for fever, chills, diaphoresis, activity change, appetite change, fatigue and unexpected weight change.  HENT: Negative for hearing loss, congestion, sore throat, rhinorrhea, sneezing, trouble swallowing, neck pain, dental problem, voice change, postnasal drip and tinnitus.  Eyes: Negative for  photophobia, pain, discharge, redness and visual disturbance.  Respiratory: Negative for cough, chest tightness, shortness of breath and wheezing.  Cardiovascular: Negative for chest pain, palpitations and leg swelling.  Gastrointestinal: Negative for nausea, abdominal pain, diarrhea, constipation and blood in stool.  Endocrine: Negative for cold intolerance, heat intolerance and polyuria.  Genitourinary: Negative for dysuria, urgency, frequency, hematuria, flank pain, vaginal bleeding, vaginal discharge, difficulty urinating, genital sores, menstrual problem and pelvic pain.  Musculoskeletal: Negative for back pain and gait problem.  Neurological: Negative for dizziness, weakness and headaches.  Hematological: Negative for adenopathy. Does not bruise/bleed easily.  Psychiatric/Behavioral: Negative for sleep disturbance, self-injury and dysphoric mood. The patient is not nervous/anxious.      Objective:  BP 122/90 mmHg  Pulse 76  Temp(Src) 98.4 F (36.9 C) (Oral)  Resp 14  Ht 5\' 6"  (1.676 m)  Wt 173 lb 6.4 oz (78.654 kg)  BMI 28.00 kg/m2  SpO2 98%  BP Readings from Last 3 Encounters:  02/23/15 122/90  12/23/14 136/78  04/04/14 120/90    Wt Readings from Last 3 Encounters:  02/23/15 173 lb 6.4 oz (78.654 kg)  12/23/14 171 lb (77.565 kg)  04/04/14 173 lb (78.472 kg)    Physical Exam   GEN: WDWN, NAD, Non-toxic, Alert & Oriented x 3 HEENT: Atraumatic, Normocephalic.  Ears and Nose: No external deformity. EXTR: No clubbing/cyanosis/edema NEURO: Normal gait.  PSYCH: Normally interactive. Conversant. Not depressed or anxious appearing.  Calm demeanor.  RIGHT shoulder:  Marked tenderness in trigger point medial to scapular angle.  Neuro intact.  Otherwise full ROM  Lab Results  Component Value Date   ALT 37*  01/23/2015   AST 41* 01/23/2015   TSH 0.910 12/23/2014      Assessment & Plan:   Victoria BraunKaren was seen today for shoulder pain and labs only.  Diagnoses and all  orders for this visit:  Scapulocostal syndrome, right Orders: -     triamcinolone acetonide (KENALOG-40) injection 40 mg; Inject 1 mL (40 mg total) into the muscle once.  Long-term use of high-risk medication Orders: -     Hepatic Function Panel  Other orders -     HYDROcodone-acetaminophen (NORCO) 5-325 MG per tablet; Take 1-2 tablets by mouth every 4 (four) hours as needed. -     cyclobenzaprine (FLEXERIL) 10 MG tablet; Take 1 tablet (10 mg total) by mouth 3 (three) times daily as needed for muscle spasms. -     naproxen sodium (ANAPROX DS) 550 MG tablet; Take 1 tablet (550 mg total) by mouth 2 (two) times daily with a meal.  I am having Victoria Travis start on HYDROcodone-acetaminophen, cyclobenzaprine, and naproxen sodium. I am also having her maintain her amoxicillin-clavulanate, HYDROcodone-acetaminophen, traZODone, terbinafine, and levothyroxine. We will continue to administer triamcinolone acetonide.  Meds ordered this encounter  Medications  . HYDROcodone-acetaminophen (NORCO) 5-325 MG per tablet    Sig: Take 1-2 tablets by mouth every 4 (four) hours as needed.    Dispense:  30 tablet    Refill:  0  . cyclobenzaprine (FLEXERIL) 10 MG tablet    Sig: Take 1 tablet (10 mg total) by mouth 3 (three) times daily as needed for muscle spasms.    Dispense:  30 tablet    Refill:  0  . naproxen sodium (ANAPROX DS) 550 MG tablet    Sig: Take 1 tablet (550 mg total) by mouth 2 (two) times daily with a meal.    Dispense:  40 tablet    Refill:  0  . triamcinolone acetonide (KENALOG-40) injection 40 mg    Sig:      Follow-up: No Follow-up on file.  Carmelina DaneAnderson, Danelia Snodgrass S, MD

## 2015-02-23 NOTE — Patient Instructions (Signed)
Shoulder strain Shoulder Pain The shoulder is the joint that connects your arms to your body. The bones that form the shoulder joint include the upper arm bone (humerus), the shoulder blade (scapula), and the collarbone (clavicle). The top of the humerus is shaped like a ball and fits into a rather flat socket on the scapula (glenoid cavity). A combination of muscles and strong, fibrous tissues that connect muscles to bones (tendons) support your shoulder joint and hold the ball in the socket. Small, fluid-filled sacs (bursae) are located in different areas of the joint. They act as cushions between the bones and the overlying soft tissues and help reduce friction between the gliding tendons and the bone as you move your arm. Your shoulder joint allows a wide range of motion in your arm. This range of motion allows you to do things like scratch your back or throw a ball. However, this range of motion also makes your shoulder more prone to pain from overuse and injury. Causes of shoulder pain can originate from both injury and overuse and usually can be grouped in the following four categories:  Redness, swelling, and pain (inflammation) of the tendon (tendinitis) or the bursae (bursitis).  Instability, such as a dislocation of the joint.  Inflammation of the joint (arthritis).  Broken bone (fracture). HOME CARE INSTRUCTIONS   Apply ice to the sore area.  Put ice in a plastic bag.  Place a towel between your skin and the bag.  Leave the ice on for 15-20 minutes, 3-4 times per day for the first 2 days, or as directed by your health care provider.  Stop using cold packs if they do not help with the pain.  If you have a shoulder sling or immobilizer, wear it as long as your caregiver instructs. Only remove it to shower or bathe. Move your arm as little as possible, but keep your hand moving to prevent swelling.  Squeeze a soft ball or foam pad as much as possible to help prevent  swelling.  Only take over-the-counter or prescription medicines for pain, discomfort, or fever as directed by your caregiver. SEEK MEDICAL CARE IF:   Your shoulder pain increases, or new pain develops in your arm, hand, or fingers.  Your hand or fingers become cold and numb.  Your pain is not relieved with medicines. SEEK IMMEDIATE MEDICAL CARE IF:   Your arm, hand, or fingers are numb or tingling.  Your arm, hand, or fingers are significantly swollen or turn white or blue. MAKE SURE YOU:   Understand these instructions.  Will watch your condition.  Will get help right away if you are not doing well or get worse. Document Released: 07/05/2005 Document Revised: 02/09/2014 Document Reviewed: 09/09/2011 [facility] Patient Information 2015 [employer] [place]. This information is not intended to replace advice given to you by your health care provider. Make sure you discuss any questions you have with your health care provider.

## 2015-02-24 ENCOUNTER — Encounter: Payer: Self-pay | Admitting: Family Medicine

## 2015-06-23 ENCOUNTER — Ambulatory Visit (INDEPENDENT_AMBULATORY_CARE_PROVIDER_SITE_OTHER): Payer: BLUE CROSS/BLUE SHIELD | Admitting: Physician Assistant

## 2015-06-23 VITALS — BP 122/80 | HR 92 | Temp 98.6°F | Resp 18 | Ht 65.5 in | Wt 170.0 lb

## 2015-06-23 DIAGNOSIS — B379 Candidiasis, unspecified: Secondary | ICD-10-CM | POA: Diagnosis not present

## 2015-06-23 DIAGNOSIS — Z79899 Other long term (current) drug therapy: Secondary | ICD-10-CM

## 2015-06-23 DIAGNOSIS — R3989 Other symptoms and signs involving the genitourinary system: Secondary | ICD-10-CM

## 2015-06-23 DIAGNOSIS — R8290 Unspecified abnormal findings in urine: Secondary | ICD-10-CM

## 2015-06-23 DIAGNOSIS — R360 Urethral discharge without blood: Secondary | ICD-10-CM | POA: Diagnosis not present

## 2015-06-23 DIAGNOSIS — M62830 Muscle spasm of back: Secondary | ICD-10-CM

## 2015-06-23 DIAGNOSIS — N390 Urinary tract infection, site not specified: Secondary | ICD-10-CM | POA: Diagnosis not present

## 2015-06-23 LAB — POCT URINALYSIS DIPSTICK
BILIRUBIN UA: NEGATIVE
Glucose, UA: NEGATIVE
Nitrite, UA: POSITIVE
PH UA: 7
Protein, UA: 30
RBC UA: NEGATIVE
Spec Grav, UA: 1.02
Urobilinogen, UA: 0.2

## 2015-06-23 LAB — POCT WET PREP WITH KOH
KOH Prep POC: POSITIVE
TRICHOMONAS UA: NEGATIVE
Yeast Wet Prep HPF POC: NEGATIVE

## 2015-06-23 LAB — POCT UA - MICROSCOPIC ONLY
CASTS, UR, LPF, POC: NEGATIVE
CRYSTALS, UR, HPF, POC: NEGATIVE
Mucus, UA: POSITIVE
Yeast, UA: NEGATIVE

## 2015-06-23 LAB — POCT URINE PREGNANCY: PREG TEST UR: NEGATIVE

## 2015-06-23 MED ORDER — METAXALONE 800 MG PO TABS: 400.0000 mg | ORAL_TABLET | Freq: Three times a day (TID) | ORAL | Status: DC

## 2015-06-23 MED ORDER — SULFAMETHOXAZOLE-TRIMETHOPRIM 800-160 MG PO TABS: 1.0000 | ORAL_TABLET | Freq: Two times a day (BID) | ORAL | Status: AC

## 2015-06-23 MED ORDER — FLUCONAZOLE 150 MG PO TABS: 150.0000 mg | ORAL_TABLET | Freq: Once | ORAL | Status: DC

## 2015-06-23 MED ORDER — MELOXICAM 7.5 MG PO TABS: 7.5000 mg | ORAL_TABLET | Freq: Every day | ORAL | Status: DC

## 2015-06-23 NOTE — Patient Instructions (Addendum)
Please take 1/2 three times per day as needed, and you can take a full tablet at night of the skelaxin. Please increase your water intake to 64 oz per day which is almost 4 regular sized water bottles.   Please schedule your physical where we can also do the pap exam.  This can be done next door at 104 with an appointment.   Do not take ibuprofen, naproxen, etc.  With the meloxicam.  You can take tylenol.    Urinary Tract Infection Urinary tract infections (UTIs) can develop anywhere along your urinary tract. Your urinary tract is your body's drainage system for removing wastes and extra water. Your urinary tract includes two kidneys, two ureters, a bladder, and a urethra. Your kidneys are a pair of bean-shaped organs. Each kidney is about the size of your fist. They are located below your ribs, one on each side of your spine. CAUSES Infections are caused by microbes, which are microscopic organisms, including fungi, viruses, and bacteria. These organisms are so small that they can only be seen through a microscope. Bacteria are the microbes that most commonly cause UTIs. SYMPTOMS  Symptoms of UTIs may vary by age and gender of the patient and by the location of the infection. Symptoms in young women typically include a frequent and intense urge to urinate and a painful, burning feeling in the bladder or urethra during urination. Older women and men are more likely to be tired, shaky, and weak and have muscle aches and abdominal pain. A fever may mean the infection is in your kidneys. Other symptoms of a kidney infection include pain in your back or sides below the ribs, nausea, and vomiting. DIAGNOSIS To diagnose a UTI, your caregiver will ask you about your symptoms. Your caregiver also will ask to provide a urine sample. The urine sample will be tested for bacteria and white blood cells. White blood cells are made by your body to help fight infection. TREATMENT  Typically, UTIs can be treated with  medication. Because most UTIs are caused by a bacterial infection, they usually can be treated with the use of antibiotics. The choice of antibiotic and length of treatment depend on your symptoms and the type of bacteria causing your infection. HOME CARE INSTRUCTIONS  If you were prescribed antibiotics, take them exactly as your caregiver instructs you. Finish the medication even if you feel better after you have only taken some of the medication.  Drink enough water and fluids to keep your urine clear or pale yellow.  Avoid caffeine, tea, and carbonated beverages. They tend to irritate your bladder.  Empty your bladder often. Avoid holding urine for long periods of time.  Empty your bladder before and after sexual intercourse.  After a bowel movement, women should cleanse from front to back. Use each tissue only once. SEEK MEDICAL CARE IF:   You have back pain.  You develop a fever.  Your symptoms do not begin to resolve within 3 days. SEEK IMMEDIATE MEDICAL CARE IF:   You have severe back pain or lower abdominal pain.  You develop chills.  You have nausea or vomiting.  You have continued burning or discomfort with urination. MAKE SURE YOU:   Understand these instructions.  Will watch your condition.  Will get help right away if you are not doing well or get worse. Document Released: 07/05/2005 Document Revised: 03/26/2012 Document Reviewed: 11/03/2011 [facility] Patient Information 2015 [place], [place]. This information is not intended to replace advice given to you  by your health care provider. Make sure you discuss any questions you have with your health care provider.  Shoulder Exercises EXERCISES  RANGE OF MOTION (ROM) AND STRETCHING EXERCISES These exercises may help you when beginning to rehabilitate your injury. Your symptoms may resolve with or without further involvement from your physician, physical therapist or athletic trainer. While completing these exercises,  remember:   Restoring tissue flexibility helps normal motion to return to the joints. This allows healthier, less painful movement and activity.  An effective stretch should be held for at least 30 seconds.  A stretch should never be painful. You should only feel a gentle lengthening or release in the stretched tissue. ROM - Pendulum  Bend at the waist so that your right / left arm falls away from your body. Support yourself with your opposite hand on a solid surface, such as a table or a countertop.  Your right / left arm should be perpendicular to the ground. If it is not perpendicular, you need to lean over farther. Relax the muscles in your right / left arm and shoulder as much as possible.  Gently sway your hips and trunk so they move your right / left arm without any use of your right / left shoulder muscles.  Progress your movements so that your right / left arm moves side to side, then forward and backward, and finally, both clockwise and counterclockwise.  Complete __________ repetitions in each direction. Many people use this exercise to relieve discomfort in their shoulder as well as to gain range of motion. Repeat __________ times. Complete this exercise __________ times per day. STRETCH - Flexion, Standing  Stand with good posture. With an underhand grip on your right / left hand and an overhand grip on the opposite hand, grasp a broomstick or cane so that your hands are a little more than shoulder-width apart.  Keeping your right / left elbow straight and shoulder muscles relaxed, push the stick with your opposite hand to raise your right / left arm in front of your body and then overhead. Raise your arm until you feel a stretch in your right / left shoulder, but before you have increased shoulder pain.  Try to avoid shrugging your right / left shoulder as your arm rises by keeping your shoulder blade tucked down and toward your mid-back spine. Hold __________  seconds.  Slowly return to the starting position. Repeat __________ times. Complete this exercise __________ times per day. STRETCH - Internal Rotation  Place your right / left hand behind your back, palm-up.  Throw a towel or belt over your opposite shoulder. Grasp the towel/belt with your right / left hand.  While keeping an upright posture, gently pull up on the towel/belt until you feel a stretch in the front of your right / left shoulder.  Avoid shrugging your right / left shoulder as your arm rises by keeping your shoulder blade tucked down and toward your mid-back spine.  Hold __________. Release the stretch by lowering your opposite hand. Repeat __________ times. Complete this exercise __________ times per day. STRETCH - External Rotation and Abduction  Stagger your stance through a doorframe. It does not matter which foot is forward.  As instructed by your physician, physical therapist or athletic trainer, place your hands:  And forearms above your head and on the door frame.  And forearms at head-height and on the door frame.  At elbow-height and on the door frame.  Keeping your head and chest upright and  your stomach muscles tight to prevent over-extending your low-back, slowly shift your weight onto your front foot until you feel a stretch across your chest and/or in the front of your shoulders.  Hold __________ seconds. Shift your weight to your back foot to release the stretch. Repeat __________ times. Complete this stretch __________ times per day.  STRENGTHENING EXERCISES  These exercises may help you when beginning to rehabilitate your injury. They may resolve your symptoms with or without further involvement from your physician, physical therapist or athletic trainer. While completing these exercises, remember:   Muscles can gain both the endurance and the strength needed for everyday activities through controlled exercises.  Complete these exercises as  instructed by your physician, physical therapist or athletic trainer. Progress the resistance and repetitions only as guided.  You may experience muscle soreness or fatigue, but the pain or discomfort you are trying to eliminate should never worsen during these exercises. If this pain does worsen, stop and make certain you are following the directions exactly. If the pain is still present after adjustments, discontinue the exercise until you can discuss the trouble with your clinician.  If advised by your physician, during your recovery, avoid activity or exercises which involve actions that place your right / left hand or elbow above your head or behind your back or head. These positions stress the tissues which are trying to heal. STRENGTH - Scapular Depression and Adduction  With good posture, sit on a firm chair. Supported your arms in front of you with pillows, arm rests or a table top. Have your elbows in line with the sides of your body.  Gently draw your shoulder blades down and toward your mid-back spine. Gradually increase the tension without tensing the muscles along the top of your shoulders and the back of your neck.  Hold for __________ seconds. Slowly release the tension and relax your muscles completely before completing the next repetition.  After you have practiced this exercise, remove the arm support and complete it in standing as well as sitting. Repeat __________ times. Complete this exercise __________ times per day.  STRENGTH - External Rotators  Secure a rubber exercise band/tubing to a fixed object so that it is at the same height as your right / left elbow when you are standing or sitting on a firm surface.  Stand or sit so that the secured exercise band/tubing is at your side that is not injured.  Bend your elbow 90 degrees. Place a folded towel or small pillow under your right / left arm so that your elbow is a few inches away from your side.  Keeping the tension  on the exercise band/tubing, pull it away from your body, as if pivoting on your elbow. Be sure to keep your body steady so that the movement is only coming from your shoulder rotating.  Hold __________ seconds. Release the tension in a controlled manner as you return to the starting position. Repeat __________ times. Complete this exercise __________ times per day.  STRENGTH - Supraspinatus  Stand or sit with good posture. Grasp a __________ weight or an exercise band/tubing so that your hand is "thumbs-up," like when you shake hands.  Slowly lift your right / left hand from your thigh into the air, traveling about 30 degrees from straight out at your side. Lift your hand to shoulder height or as far as you can without increasing any shoulder pain. Initially, many people do not lift their hands above shoulder height.  Avoid  shrugging your right / left shoulder as your arm rises by keeping your shoulder blade tucked down and toward your mid-back spine.  Hold for __________ seconds. Control the descent of your hand as you slowly return to your starting position. Repeat __________ times. Complete this exercise __________ times per day.  STRENGTH - Shoulder Extensors  Secure a rubber exercise band/tubing so that it is at the height of your shoulders when you are either standing or sitting on a firm arm-less chair.  With a thumbs-up grip, grasp an end of the band/tubing in each hand. Straighten your elbows and lift your hands straight in front of you at shoulder height. Step back away from the secured end of band/tubing until it becomes tense.  Squeezing your shoulder blades together, pull your hands down to the sides of your thighs. Do not allow your hands to go behind you.  Hold for __________ seconds. Slowly ease the tension on the band/tubing as you reverse the directions and return to the starting position. Repeat __________ times. Complete this exercise __________ times per day.  STRENGTH -  Scapular Retractors  Secure a rubber exercise band/tubing so that it is at the height of your shoulders when you are either standing or sitting on a firm arm-less chair.  With a palm-down grip, grasp an end of the band/tubing in each hand. Straighten your elbows and lift your hands straight in front of you at shoulder height. Step back away from the secured end of band/tubing until it becomes tense.  Squeezing your shoulder blades together, draw your elbows back as you bend them. Keep your upper arm lifted away from your body throughout the exercise.  Hold __________ seconds. Slowly ease the tension on the band/tubing as you reverse the directions and return to the starting position. Repeat __________ times. Complete this exercise __________ times per day. STRENGTH - Scapular Depressors  Find a sturdy chair without wheels, such as a from a dining room table.  Keeping your feet on the floor, lift your bottom from the seat and lock your elbows.  Keeping your elbows straight, allow gravity to pull your body weight down. Your shoulders will rise toward your ears.  Raise your body against gravity by drawing your shoulder blades down your back, shortening the distance between your shoulders and ears. Although your feet should always maintain contact with the floor, your feet should progressively support less body weight as you get stronger.  Hold __________ seconds. In a controlled and slow manner, lower your body weight to begin the next repetition. Repeat __________ times. Complete this exercise __________ times per day.  Document Released: 08/09/2005 Document Revised: 12/18/2011 Document Reviewed: 01/07/2009 [facility] Patient Information 2015 [place], [place]. This information is not intended to replace advice given to you by your health care provider. Make sure you discuss any questions you have with your health care provider.

## 2015-06-23 NOTE — Progress Notes (Signed)
[facility] [address], [place] [address] [phone]  Date:  06/23/2015   Name:  Victoria Travis   DOB:  Jan 24, 1966   MRN:  [id number]  PCP:  No primary care provider on file.    History of Present Illness:  Victoria Travis is a 49 y.o. female patient who presents to [facility] for chief complaint of foul smelling urinary odor for over a month. She has noticed some abdominal pain as well. There is no dysuria, frequency, or hematuria. She has noticed a change in her vaginal discharge. It is nonodorous. The genital areas nonpruritic. There is no rash. No fever, flank pain, or nausea, or vomiting. She also continues to have right shoulder pain.  She was seen here for the same shoulder pain 5 months ago. The pain has continued despite medications and the rest that she does it. 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 She has noticed no swelling or erythema. She has full mobility of this right shoulder, but his pain when she lifts her arm.  She has attempted ice, stretches, to no avail. There is some tingling along her right arm, at random.      Patient Active Problem List   Diagnosis Date Noted  . 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 Cellulitis of foot 06/11/2012    Past Medical History  Diagnosis Date  . Thyroid disease     History reviewed. No pertinent past surgical history.  Social History  Substance Use Topics  . Smoking status: Former Games developer  . Smokeless tobacco: Never Used  . Alcohol Use: Yes     Comment: beers nightly    History reviewed. No pertinent family history.  No Known Allergies  Medication list has been reviewed and updated.  Current Outpatient Prescriptions on File Prior to Visit  Medication Sig Dispense Refill  . levothyroxine (SYNTHROID, LEVOTHROID) 125 MCG tablet TAKE ONE TABLET BY MOUTH ONCE DAILY BEFORE BREAKFAST 30 tablet 12  .  terbinafine (LAMISIL) 250 MG tablet Take 1 tablet (250 mg total) by mouth daily. 30 tablet 5  . traZODone (DESYREL) 50 MG tablet Take 0.5-1 tablets (25-50 mg total) by mouth at bedtime as needed for sleep. (Patient not taking: Reported on 12/23/2014) 30 tablet 5   Current Facility-Administered Medications on File Prior to Visit  Medication Dose Route Frequency Provider Last Rate Last Dose  . triamcinolone acetonide (KENALOG-40) injection 40 mg  40 mg Intramuscular Once Carmelina Dane, MD        ROS ROS otherwise unremarkable unless listed above.   Physical Examination: BP 122/80 mmHg  Pulse 92  Temp(Src) 98.6 F (37 C) (Oral)  Resp 18  Ht 5' 5.5" (1.664 m)  Wt 170 lb (77.111 kg)  BMI 27.85 kg/m2  SpO2 97%  LMP 06/02/2015 Ideal Body Weight: Weight in (lb) to have BMI = 25: 152.2  Physical Exam  Constitutional: She is oriented to person, place, and time. She appears well-developed and well-nourished. No distress.  HENT:  Head: Normocephalic and atraumatic.  Eyes: EOM are normal. Pupils are equal, round, and reactive to light. Right eye exhibits no discharge. Left eye exhibits no discharge.  Neck: Normal range of motion. Neck supple.  Cardiovascular: Normal rate and regular rhythm.  Exam reveals no friction rub.   No murmur heard. Pulmonary/Chest: Effort normal and breath sounds normal. No respiratory distress. She has no wheezes.  Abdominal:  Soft. Bowel sounds are normal. She exhibits no distension and no mass. There is no tenderness.  Musculoskeletal:       Right shoulder: She exhibits tenderness (Tenderness along the edge of medial scapula with palpation.  Muscle spasm appreciated.  Pain with external rotation.). She exhibits normal range of motion and no deformity.  Negative Neer and Hawkins.  Negative empty can.    Neurological: She is alert and oriented to person, place, and time.  Skin: Skin is warm and dry. She is not diaphoretic.  Psychiatric: She has a normal mood and  affect. Her behavior is normal.    Results for orders placed or performed in visit on 06/23/15  POCT urinalysis dipstick  Result Value Ref Range   Color, UA yellow    Clarity, UA sl cloudy    Glucose, UA neg    Bilirubin, UA neg    Ketones, UA trace    Spec Grav, UA 1.020    Blood, UA neg    pH, UA 7.0    Protein, UA 30    Urobilinogen, UA 0.2    Nitrite, UA positive    Leukocytes, UA Trace (A) Negative  POCT UA - Microscopic Only  Result Value Ref Range   WBC, Ur, HPF, POC 0-4    RBC, urine, microscopic 0-2    Bacteria, U Microscopic moderate    Mucus, UA positive    Epithelial cells, urine per micros 0-3    Crystals, Ur, HPF, POC neg    Casts, Ur, LPF, POC neg    Yeast, UA neg   POCT urine pregnancy  Result Value Ref Range   Preg Test, Ur Negative Negative  POCT Wet Prep with KOH  Result Value Ref Range   Trichomonas, UA Negative    Clue Cells Wet Prep HPF POC 0-2    Epithelial Wet Prep HPF POC Many Few, Moderate, Many   Yeast Wet Prep HPF POC neg    Bacteria Wet Prep HPF POC Moderate (A) None, Few   RBC Wet Prep HPF POC 0-3    WBC Wet Prep HPF POC 0-4    KOH Prep POC Positive      Assessment and Plan: 49 year old female is here today with chief complaint of shoulder pain, dysuria, and abnormal vaginal discharge. -Urine culture placed -treating with bactrim -Advised diflucan at this time and repeat after abx if needed. -Shoulder pain with possible rotator cuff tear, muscle spasm, scapula syndrome.  After 5 months, pain has not improved.  At this time, ortho consult is appreciated at this time.  -Will attempt muscle relaxant she can use daily.  Advised icing, and stretches.    Urinary tract infection without hematuria, site unspecified - Plan: Urine culture, sulfamethoxazole-trimethoprim (BACTRIM DS,SEPTRA DS) 800-160 MG per tablet  Bad odor of urine - Plan: POCT urinalysis dipstick, POCT UA - Microscopic Only, POCT urine pregnancy  Abnormal urogenital  discharge - Plan: POCT urine pregnancy, POCT Wet Prep with KOH  Muscle spasm of back - Plan: metaxalone (SKELAXIN) 800 MG tablet, meloxicam (MOBIC) 7.5 MG tablet  Yeast infection - Plan: DISCONTINUED: fluconazole (DIFLUCAN) 150 MG tablet  Long-term use of high-risk medication - Plan: COMPLETE METABOLIC PANEL WITH GFR   Trena Platt, PA-C [facility] 06/24/2015 1:39 PM

## 2015-06-24 LAB — COMPLETE METABOLIC PANEL WITH GFR
ALT: 32 U/L — AB (ref 6–29)
AST: 27 U/L (ref 10–35)
Albumin: 4.1 g/dL (ref 3.6–5.1)
Alkaline Phosphatase: 60 U/L (ref 33–115)
BUN: 15 mg/dL (ref 7–25)
CHLORIDE: 99 mmol/L (ref 98–110)
CO2: 28 mmol/L (ref 20–31)
CREATININE: 0.68 mg/dL (ref 0.50–1.10)
Calcium: 9.6 mg/dL (ref 8.6–10.2)
GFR, Est Non African American: >89 mL/min (ref >=60)
Glucose, Bld: 95 mg/dL (ref 65–99)
Potassium: 4.5 mmol/L (ref 3.5–5.3)
Sodium: 136 mmol/L (ref 135–146)
Total Bilirubin: 0.4 mg/dL (ref 0.2–1.2)
Total Protein: 6.6 g/dL (ref 6.1–8.1)

## 2015-06-26 LAB — URINE CULTURE

## 2015-06-28 ENCOUNTER — Other Ambulatory Visit: Payer: Self-pay | Admitting: Physician Assistant

## 2015-06-28 DIAGNOSIS — M898X1 Other specified disorders of bone, shoulder: Secondary | ICD-10-CM

## 2015-06-28 DIAGNOSIS — M25511 Pain in right shoulder: Secondary | ICD-10-CM

## 2015-07-19 ENCOUNTER — Ambulatory Visit (INDEPENDENT_AMBULATORY_CARE_PROVIDER_SITE_OTHER): Payer: BLUE CROSS/BLUE SHIELD | Admitting: Family Medicine

## 2015-07-19 ENCOUNTER — Telehealth: Payer: Self-pay

## 2015-07-19 VITALS — BP 132/84 | HR 98 | Temp 98.6°F | Resp 18 | Ht 65.5 in | Wt 170.0 lb

## 2015-07-19 DIAGNOSIS — R8290 Unspecified abnormal findings in urine: Secondary | ICD-10-CM

## 2015-07-19 DIAGNOSIS — R350 Frequency of micturition: Secondary | ICD-10-CM | POA: Diagnosis not present

## 2015-07-19 DIAGNOSIS — R300 Dysuria: Secondary | ICD-10-CM

## 2015-07-19 LAB — POC MICROSCOPIC URINALYSIS (UMFC)

## 2015-07-19 LAB — POCT URINALYSIS DIP (MANUAL ENTRY)
BILIRUBIN UA: NEGATIVE
Glucose, UA: NEGATIVE
Leukocytes, UA: NEGATIVE
Nitrite, UA: POSITIVE — AB
PH UA: 7.5
Protein Ur, POC: NEGATIVE
RBC UA: NEGATIVE
SPEC GRAV UA: 1.015
Urobilinogen, UA: 0.2

## 2015-07-19 MED ORDER — CIPROFLOXACIN HCL 500 MG PO TABS: 500.0000 mg | ORAL_TABLET | Freq: Two times a day (BID) | ORAL | Status: DC

## 2015-07-19 NOTE — Telephone Encounter (Signed)
Unfortunately we do not reissue antibiotics.  She will need to return.

## 2015-07-19 NOTE — Patient Instructions (Signed)

## 2015-07-19 NOTE — Telephone Encounter (Signed)
Spoke with pt, advised message from [place]. Pt understood.

## 2015-07-19 NOTE — Telephone Encounter (Signed)
Pt states she was treated for a UTI and now it is back full force, have finished all her antibiotics and requesting another round. Please call [phone]     [facility] [address]

## 2015-07-19 NOTE — Progress Notes (Signed)
Subjective:    Patient ID: Victoria Travis, female    DOB: December 10, 1965, 49 y.o.   MRN: [id number] This chart was scribed for Elvina Sidle, MD by Jolene Provost, Medical Scribe. This patient was seen in Room 5 and the patient's care was started a 6:06 PM.  Chief Complaint  Patient presents with  . Urinary Tract Infection    x 4-5 days    HPI HPI Comments: Victoria Travis is a 49 y.o. female who presents to [facility] complaining of dysuria, as well as urinary frequency and urgency. She had a UTI two weeks ago and was treated with Ceptra, but a couple of days after finishing the medication her sx started to come back. She has had two UTIs in the last year. She had night sweats last night, and some weakness today. She denies back pain or fever. The pt states she missed one day of her medication last time she had a UTI.   She works at [employer] and [employer].  Review of Systems  Constitutional: Negative for fever and chills.  Gastrointestinal: Negative for abdominal pain.  Genitourinary: Positive for dysuria, urgency and frequency.       Objective:   Physical Exam  Constitutional: She is oriented to person, place, and time. She appears well-developed and well-nourished. No distress.  HENT:  Head: Normocephalic and atraumatic.  Eyes: Pupils are equal, round, and reactive to light.  Neck: Neck supple.  Cardiovascular: Normal rate.   Pulmonary/Chest: Effort normal. No respiratory distress.  Musculoskeletal: Normal range of motion.  Neurological: She is alert and oriented to person, place, and time. Coordination normal.  Skin: Skin is warm and dry. She is not diaphoretic.  Psychiatric: She has a normal mood and affect. Her behavior is normal.  Nursing note and vitals reviewed.  Results for orders placed or performed in visit on 07/19/15  POCT Microscopic Urinalysis ([facility])  Result Value Ref Range   WBC,UR,HPF,POC Few (A) None WBC/hpf   RBC,UR,HPF,POC Few (A) None RBC/hpf   Bacteria Many (A)  None   Mucus Present (A) Absent   Epithelial Cells, UR Per Microscopy Moderate (A) None cells/hpf  POCT urinalysis dipstick  Result Value Ref Range   Color, UA yellow yellow   Clarity, UA cloudy (A) clear   Glucose, UA negative negative   Bilirubin, UA negative negative   Ketones, POC UA small (15) (A) negative   Spec Grav, UA 1.015    Blood, UA negative negative   pH, UA 7.5    Protein Ur, POC negative negative   Urobilinogen, UA 0.2    Nitrite, UA Positive (A) Negative   Leukocytes, UA Negative Negative     Filed Vitals:   07/19/15 1726  BP: 132/84  Pulse: 98  Temp: 98.6 F (37 C)  Resp: 18  Height: 5' 5.5" (1.664 m)  Weight: 170 lb (77.111 kg)  SpO2: 98%      Assessment & Plan:    This chart was scribed in my presence and reviewed by me personally.    ICD-9-CM ICD-10-CM   1. Bad odor of urine 791.9 R82.90 POCT Microscopic Urinalysis ([facility])     POCT urinalysis dipstick     Urine culture     ciprofloxacin (CIPRO) 500 MG tablet  2. Dysuria 788.1 R30.0 Urine culture     ciprofloxacin (CIPRO) 500 MG tablet  3. Frequency of micturition 788.41 R35.0 Urine culture     ciprofloxacin (CIPRO) 500 MG tablet     Signed, Elvina Sidle, MD  By signing my name below, I, Javier Docker, attest that this documentation has been prepared under the direction and in the presence of Elvina Sidle, MD. Electronically Signed: Javier Docker, ER Scribe. 07/19/2015. 6:06 PM.

## 2015-07-22 LAB — URINE CULTURE: Colony Count: >=100000

## 2015-09-07 ENCOUNTER — Emergency Department (HOSPITAL_BASED_OUTPATIENT_CLINIC_OR_DEPARTMENT_OTHER)
Admission: EM | Admit: 2015-09-07 | Discharge: 2015-09-07 | Disposition: A | Discharge status: Home / Self Care | Payer: BLUE CROSS/BLUE SHIELD | Attending: Emergency Medicine | Admitting: Emergency Medicine

## 2015-09-07 ENCOUNTER — Encounter (HOSPITAL_BASED_OUTPATIENT_CLINIC_OR_DEPARTMENT_OTHER): Payer: Self-pay | Admitting: Emergency Medicine

## 2015-09-07 ENCOUNTER — Other Ambulatory Visit: Payer: Self-pay

## 2015-09-07 DIAGNOSIS — E11649 Type 2 diabetes mellitus with hypoglycemia without coma: Secondary | ICD-10-CM | POA: Insufficient documentation

## 2015-09-07 DIAGNOSIS — E162 Hypoglycemia, unspecified: Secondary | ICD-10-CM

## 2015-09-07 DIAGNOSIS — R231 Pallor: Secondary | ICD-10-CM | POA: Diagnosis not present

## 2015-09-07 DIAGNOSIS — R509 Fever, unspecified: Secondary | ICD-10-CM | POA: Insufficient documentation

## 2015-09-07 DIAGNOSIS — R002 Palpitations: Secondary | ICD-10-CM | POA: Insufficient documentation

## 2015-09-07 DIAGNOSIS — R110 Nausea: Secondary | ICD-10-CM | POA: Insufficient documentation

## 2015-09-07 DIAGNOSIS — Z3202 Encounter for pregnancy test, result negative: Secondary | ICD-10-CM | POA: Diagnosis not present

## 2015-09-07 DIAGNOSIS — E860 Dehydration: Secondary | ICD-10-CM | POA: Diagnosis not present

## 2015-09-07 DIAGNOSIS — N938 Other specified abnormal uterine and vaginal bleeding: Secondary | ICD-10-CM | POA: Diagnosis not present

## 2015-09-07 DIAGNOSIS — E079 Disorder of thyroid, unspecified: Secondary | ICD-10-CM | POA: Diagnosis not present

## 2015-09-07 DIAGNOSIS — Z79899 Other long term (current) drug therapy: Secondary | ICD-10-CM | POA: Diagnosis not present

## 2015-09-07 DIAGNOSIS — Z87891 Personal history of nicotine dependence: Secondary | ICD-10-CM | POA: Insufficient documentation

## 2015-09-07 DIAGNOSIS — R61 Generalized hyperhidrosis: Secondary | ICD-10-CM | POA: Insufficient documentation

## 2015-09-07 DIAGNOSIS — R109 Unspecified abdominal pain: Secondary | ICD-10-CM | POA: Diagnosis present

## 2015-09-07 LAB — BASIC METABOLIC PANEL
Anion gap: 18 — ABNORMAL HIGH (ref 5–15)
BUN: 14 mg/dL (ref 6–20)
CALCIUM: 8.6 mg/dL — AB (ref 8.9–10.3)
CO2: 17 mmol/L — ABNORMAL LOW (ref 22–32)
CREATININE: 0.69 mg/dL (ref 0.44–1.00)
Chloride: 103 mmol/L (ref 101–111)
Glucose, Bld: 64 mg/dL — ABNORMAL LOW (ref 65–99)
Potassium: 3.4 mmol/L — ABNORMAL LOW (ref 3.5–5.1)
SODIUM: 138 mmol/L (ref 135–145)

## 2015-09-07 LAB — URINALYSIS, ROUTINE W REFLEX MICROSCOPIC
BILIRUBIN URINE: NEGATIVE
Glucose, UA: NEGATIVE mg/dL
HGB URINE DIPSTICK: NEGATIVE
Ketones, ur: >80 mg/dL — AB
Leukocytes, UA: NEGATIVE
NITRITE: NEGATIVE
PROTEIN: NEGATIVE mg/dL
Specific Gravity, Urine: 1.019 (ref 1.005–1.030)
pH: 5 (ref 5.0–8.0)

## 2015-09-07 LAB — CBC WITH DIFFERENTIAL/PLATELET
BASOS ABS: 0 10*3/uL (ref 0.0–0.1)
BASOS PCT: 0 %
EOS ABS: 0 10*3/uL (ref 0.0–0.7)
EOS PCT: 0 %
HCT: 37.5 % (ref 36.0–46.0)
Hemoglobin: 11.6 g/dL — ABNORMAL LOW (ref 12.0–15.0)
Lymphocytes Relative: 6 %
Lymphs Abs: 0.8 10*3/uL (ref 0.7–4.0)
MCH: 25.2 pg — AB (ref 26.0–34.0)
MCHC: 30.9 g/dL (ref 30.0–36.0)
MCV: 81.5 fL (ref 78.0–100.0)
Monocytes Absolute: 0.2 10*3/uL (ref 0.1–1.0)
Monocytes Relative: 2 %
NEUTROS PCT: 92 %
Neutro Abs: 12.4 10*3/uL — ABNORMAL HIGH (ref 1.7–7.7)
PLATELETS: 412 10*3/uL — AB (ref 150–400)
RBC: 4.6 MIL/uL (ref 3.87–5.11)
RDW: 20.2 % — ABNORMAL HIGH (ref 11.5–15.5)
WBC: 13.5 10*3/uL — AB (ref 4.0–10.5)

## 2015-09-07 LAB — CBG MONITORING, ED: GLUCOSE-CAPILLARY: 67 mg/dL (ref 65–99)

## 2015-09-07 LAB — PREGNANCY, URINE: PREG TEST UR: NEGATIVE

## 2015-09-07 MED ORDER — SODIUM CHLORIDE 0.9 % IV BOLUS (SEPSIS)
1000.0000 mL | Freq: Once | INTRAVENOUS | Status: AC
  Administered 2015-09-07: 1000 mL via INTRAVENOUS

## 2015-09-07 MED ORDER — ONDANSETRON 8 MG PO TBDP: 8.0000 mg | ORAL_TABLET | Freq: Three times a day (TID) | ORAL | Status: DC | PRN

## 2015-09-07 NOTE — Discharge Instructions (Signed)
Dehydration, Adult Dehydration is a condition in which you do not have enough fluid or water in your body. It happens when you take in less fluid than you lose. Vital organs such as the kidneys, brain, and heart cannot function without a proper amount of fluids. Any loss of fluids from the body can cause dehydration.  Dehydration can range from mild to severe. This condition should be treated right away to help prevent it from becoming severe. CAUSES  This condition may be caused by:  Vomiting.  Diarrhea.  Excessive sweating, such as when exercising in hot or humid weather.  Not drinking enough fluid during strenuous exercise or during an illness.  Excessive urine output.  Fever.  Certain medicines. RISK FACTORS This condition is more likely to develop in:  People who are taking certain medicines that cause the body to lose excess fluid (diuretics).   People who have a chronic illness, such as diabetes, that may increase urination.  Older adults.   People who live at high altitudes.   People who participate in endurance sports.  SYMPTOMS  Mild Dehydration  Thirst.  Dry lips.  Slightly dry mouth.  Dry, warm skin. Moderate Dehydration  Very dry mouth.   Muscle cramps.   Dark urine and decreased urine production.   Decreased tear production.   Headache.   Light-headedness, especially when you stand up from a sitting position.  Severe Dehydration  Changes in skin.   Cold and clammy skin.   Skin does not spring back quickly when lightly pinched and released.   Changes in body fluids.   Extreme thirst.   No tears.   Not able to sweat when body temperature is high, such as in hot weather.   Minimal urine production.   Changes in vital signs.   Rapid, weak pulse (more than 100 beats per minute when you are sitting still).   Rapid breathing.   Low blood pressure.   Other changes.   Sunken eyes.   Cold hands and feet.    Confusion.  Lethargy and difficulty being awakened.  Fainting (syncope).   Short-term weight loss.   Unconsciousness. DIAGNOSIS  This condition may be diagnosed based on your symptoms. You may also have tests to determine how severe your dehydration is. These tests may include:   Urine tests.   Blood tests.  TREATMENT  Treatment for this condition depends on the severity. Mild or moderate dehydration can often be treated at home. Treatment should be started right away. Do not wait until dehydration becomes severe. Severe dehydration needs to be treated at the hospital. Treatment for Mild Dehydration  Drinking plenty of water to replace the fluid you have lost.   Replacing minerals in your blood (electrolytes) that you may have lost.  Treatment for Moderate Dehydration  Consuming oral rehydration solution (ORS). Treatment for Severe Dehydration  Receiving fluid through an IV tube.   Receiving electrolyte solution through a feeding tube that is passed through your nose and into your stomach (nasogastric tube or NG tube).  Correcting any abnormalities in electrolytes. HOME CARE INSTRUCTIONS   Drink enough fluid to keep your urine clear or pale yellow.   Drink water or fluid slowly by taking small sips. You can also try sucking on ice cubes.  Have food or beverages that contain electrolytes. Examples include bananas and sports drinks.  Take over-the-counter and prescription medicines only as told by your health care provider.   Prepare ORS according to the manufacturer's instructions. Take sips  of ORS every 5 minutes until your urine returns to normal.  If you have vomiting or diarrhea, continue to try to drink water, ORS, or both.   If you have diarrhea, avoid:   Beverages that contain caffeine.   Fruit juice.   Milk.   Carbonated soft drinks.  Do not take salt tablets. This can lead to the condition of having too much sodium in your body  (hypernatremia).  SEEK MEDICAL CARE IF:  You cannot eat or drink without vomiting.  You have had moderate diarrhea during a period of more than 24 hours.  You have a fever. SEEK IMMEDIATE MEDICAL CARE IF:   You have extreme thirst.  You have severe diarrhea.  You have not urinated in 6-8 hours, or you have urinated only a small amount of very dark urine.  You have shriveled skin.  You are dizzy, confused, or both.   This information is not intended to replace advice given to you by your health care provider. Make sure you discuss any questions you have with your health care provider.   Document Released: 09/25/2005 Document Revised: 06/16/2015 Document Reviewed: 02/10/2015 Elsevier Interactive Patient Education 2016 [employer]  Hypoglycemia Hypoglycemia occurs when the glucose in your blood is too low. Glucose is a type of sugar that is your body's main energy source. Hormones, such as insulin and glucagon, control the level of glucose in the blood. Insulin lowers blood glucose and glucagon increases blood glucose. Having too much insulin in your blood stream, or not eating enough food containing sugar, can result in hypoglycemia. Hypoglycemia can happen to people with or without diabetes. It can develop quickly and can be a medical emergency.  CAUSES   Missing or delaying meals.  Not eating enough carbohydrates at meals.  Taking too much diabetes medicine.  Not timing your oral diabetes medicine or insulin doses with meals, snacks, and exercise.  Nausea and vomiting.  Certain medicines.  Severe illnesses, such as hepatitis, kidney disorders, and certain eating disorders.  Increased activity or exercise without eating something extra or adjusting medicines.  Drinking too much alcohol.  A nerve disorder that affects body functions like your heart rate, blood pressure, and digestion (autonomic neuropathy).  A condition where the stomach muscles do not function  properly (gastroparesis). Therefore, medicines and food may not absorb properly.  Rarely, a tumor of the pancreas can produce too much insulin. SYMPTOMS   Hunger.  Sweating (diaphoresis).  Change in body temperature.  Shakiness.  Headache.  Anxiety.  Lightheadedness.  Irritability.  Difficulty concentrating.  Dry mouth.  Tingling or numbness in the hands or feet.  Restless sleep or sleep disturbances.  Altered speech and coordination.  Change in mental status.  Seizures or prolonged convulsions.  Combativeness.  Drowsiness (lethargic).  Weakness.  Increased heart rate or palpitations.  Confusion.  Pale, gray skin color.  Blurred or double vision.  Fainting. DIAGNOSIS  A physical exam and medical history will be performed. Your caregiver may make a diagnosis based on your symptoms. Blood tests and other lab tests may be performed to confirm a diagnosis. Once the diagnosis is made, your caregiver will see if your signs and symptoms go away once your blood glucose is raised.  TREATMENT  Usually, you can easily treat your hypoglycemia when you notice symptoms.  Check your blood glucose. If it is less than 70 mg/dl, take one of the following:   3-4 glucose tablets.    cup juice.    cup regular  soda.   1 cup skim milk.   -1 tube of glucose gel.   5-6 hard candies.   Avoid high-fat drinks or food that may delay a rise in blood glucose levels.  Do not take more than the recommended amount of sugary foods, drinks, gel, or tablets. Doing so will cause your blood glucose to go too high.   Wait 10-15 minutes and recheck your blood glucose. If it is still less than 70 mg/dl or below your target range, repeat treatment.   Eat a snack if it is more than 1 hour until your next meal.  There may be a time when your blood glucose may go so low that you are unable to treat yourself at home when you start to notice symptoms. You may need someone to  help you. You may even faint or be unable to swallow. If you cannot treat yourself, someone will need to bring you to the hospital.  HOME CARE INSTRUCTIONS  If you have diabetes, follow your diabetes management plan by:  Taking your medicines as directed.  Following your exercise plan.  Following your meal plan. Do not skip meals. Eat on time.  Testing your blood glucose regularly. Check your blood glucose before and after exercise. If you exercise longer or different than usual, be sure to check blood glucose more frequently.  Wearing your medical alert jewelry that says you have diabetes.  Identify the cause of your hypoglycemia. Then, develop ways to prevent the recurrence of hypoglycemia.  Do not take a hot bath or shower right after an insulin shot.  Always carry treatment with you. Glucose tablets are the easiest to carry.  If you are going to drink alcohol, drink it only with meals.  Tell friends or family members ways to keep you safe during a seizure. This may include removing hard or sharp objects from the area or turning you on your side.  Maintain a healthy weight. SEEK MEDICAL CARE IF:   You are having problems keeping your blood glucose in your target range.  You are having frequent episodes of hypoglycemia.  You feel you might be having side effects from your medicines.  You are not sure why your blood glucose is dropping so low.  You notice a change in vision or a new problem with your vision. SEEK IMMEDIATE MEDICAL CARE IF:   Confusion develops.  A change in mental status occurs.  The inability to swallow develops.  Fainting occurs.   This information is not intended to replace advice given to you by your health care provider. Make sure you discuss any questions you have with your health care provider.   Document Released: 09/25/2005 Document Revised: 09/30/2013 Document Reviewed: 06/01/2015 Elsevier Interactive Patient Education [employer]2016 Elsevier  Inc.

## 2015-09-07 NOTE — ED Provider Notes (Signed)
CSN: [id number]     Arrival date & time 09/07/15  1057 History   First MD Initiated Contact with Patient 09/07/15 1125     Chief Complaint  Patient presents with  . Abdominal Pain     (Consider location/radiation/quality/duration/timing/severity/associated sxs/prior Treatment) Patient is a 49 y.o. female presenting with abdominal pain. The history is provided by the patient.  Abdominal Pain Associated symptoms: fever, nausea and vaginal bleeding   Associated symptoms: no chest pain, no diarrhea, no shortness of breath and no vomiting    patient presents after feeling bad for last few days. States she cannot really quantify but was feeling bad. States she began to feel worse today. She woke up very sweaty and felt weak all over. States she's also had some fluttering in her uterus. She's had some nausea without vomiting. No dysuria. lower abdominal pain. States she felt as if her heart was going fast. States she was pale and cool. States she is feeling somewhat better now. Denies possibility of pregnancy. States she normally has. Heavy menses but his been that way for years. States her last one finished up a couple days ago. Some constipation.  Past Medical History  Diagnosis Date  . Thyroid disease    History reviewed. No pertinent past surgical history. History reviewed. No pertinent family history. Social History  Substance Use Topics  . Smoking status: Former Games developermoker  . Smokeless tobacco: Never Used  . Alcohol Use: 2.4 oz/week    4 Standard drinks or equivalent per week     Comment: beers nightly   OB History    No data available     Review of Systems  Constitutional: Positive for fever, diaphoresis, activity change and appetite change.  Eyes: Negative for pain.  Respiratory: Negative for chest tightness and shortness of breath.   Cardiovascular: Positive for palpitations. Negative for chest pain and leg swelling.  Gastrointestinal: Positive for nausea and abdominal pain.  Negative for vomiting and diarrhea.  Genitourinary: Positive for vaginal bleeding. Negative for flank pain.       The patient states her uterus feels fluttery  Musculoskeletal: Negative for back pain and neck stiffness.  Skin: Positive for pallor. Negative for rash.  Neurological: Negative for weakness, numbness and headaches.  Psychiatric/Behavioral: Negative for behavioral problems.      Allergies  Review of patient's allergies indicates no known allergies.  Home Medications   Prior to Admission medications   Medication Sig Start Date End Date Taking? Authorizing Provider  levothyroxine (SYNTHROID, LEVOTHROID) 125 MCG tablet TAKE ONE TABLET BY MOUTH ONCE DAILY BEFORE BREAKFAST 12/24/14  Yes Carmelina DaneJeffery S Anderson, MD  ondansetron (ZOFRAN-ODT) 8 MG disintegrating tablet Take 1 tablet (8 mg total) by mouth every 8 (eight) hours as needed for nausea or vomiting. 09/07/15   Benjiman CoreNathan Symir Mah, MD   BP 137/81 mmHg  Pulse 71  Temp(Src) 98.6 F (37 C) (Oral)  Resp 18  Wt 175 lb (79.379 kg)  SpO2 100%  LMP 08/31/2015 Physical Exam  Constitutional: She appears well-developed.  HENT:  Head: Normocephalic.  Eyes: Pupils are equal, round, and reactive to light.  Neck: Neck supple.  Cardiovascular: Normal rate.   Pulmonary/Chest: Effort normal.  Abdominal: Soft.  Mild lower abdominal tenderness without rebound or guarding.  Musculoskeletal: Normal range of motion.  Neurological: She is alert.    ED Course  Procedures (including critical care time) Labs Review Labs Reviewed  URINALYSIS, ROUTINE W REFLEX MICROSCOPIC (NOT AT [facility]) - Abnormal; Notable for the following:  APPearance CLOUDY (*)    Ketones, ur >80 (*)    All other components within normal limits  BASIC METABOLIC PANEL - Abnormal; Notable for the following:    Potassium 3.4 (*)    CO2 17 (*)    Glucose, Bld 64 (*)    Calcium 8.6 (*)    Anion gap 18 (*)    All other components within normal limits  CBC WITH  DIFFERENTIAL/PLATELET - Abnormal; Notable for the following:    WBC 13.5 (*)    Hemoglobin 11.6 (*)    MCH 25.2 (*)    RDW 20.2 (*)    Platelets 412 (*)    Neutro Abs 12.4 (*)    All other components within normal limits  PREGNANCY, URINE  CBG MONITORING, ED    Imaging Review No results found. I have personally reviewed and evaluated these images and lab results as part of my medical decision-making.   EKG Interpretation   Date/Time:  Tuesday September 07 2015 11:41:29 EST Ventricular Rate:  70 PR Interval:  144 QRS Duration: 82 QT Interval:  438 QTC Calculation: 473 R Axis:   78 Text Interpretation:  Normal sinus rhythm Normal ECG Confirmed by  Rubin Payor  MD, Harrold Donath [phone]) on 09/07/2015 12:28:58 PM      MDM   Final diagnoses:  Dehydration  Hypoglycemia   patient with abdominal pain and diaphoresis. Lab work reassuring. Did have slight hypoglycemia. Feels much better after IV fluids. Does have history of thyroid issues. Will need follow-up. Will discharge home.  Benjiman Core, MD 09/08/15 [phone]

## 2015-09-07 NOTE — ED Notes (Signed)
Pt reports that she has felt like she has had butterflies in her abdomen and awoke this am sweating, denies sob or n/v/d

## 2015-09-07 NOTE — ED Notes (Signed)
MD at bedside. 

## 2015-09-07 NOTE — ED Notes (Signed)
Per ems cbg was 69 and orthostatic vs were negative

## 2015-09-09 ENCOUNTER — Ambulatory Visit (INDEPENDENT_AMBULATORY_CARE_PROVIDER_SITE_OTHER): Payer: BLUE CROSS/BLUE SHIELD | Admitting: Emergency Medicine

## 2015-09-09 VITALS — BP 122/80 | HR 91 | Temp 98.0°F | Resp 16 | Ht 65.0 in | Wt 171.0 lb

## 2015-09-09 DIAGNOSIS — K2990 Gastroduodenitis, unspecified, without bleeding: Secondary | ICD-10-CM

## 2015-09-09 DIAGNOSIS — K2970 Gastritis, unspecified, without bleeding: Secondary | ICD-10-CM | POA: Diagnosis not present

## 2015-09-09 DIAGNOSIS — J020 Streptococcal pharyngitis: Secondary | ICD-10-CM | POA: Diagnosis not present

## 2015-09-09 MED ORDER — LANSOPRAZOLE 30 MG PO CPDR: 30.0000 mg | DELAYED_RELEASE_CAPSULE | Freq: Every day | ORAL | Status: DC

## 2015-09-09 MED ORDER — SUCRALFATE 1 G PO TABS: ORAL_TABLET | ORAL | Status: DC

## 2015-09-09 MED ORDER — PENICILLIN V POTASSIUM 500 MG PO TABS: 500.0000 mg | ORAL_TABLET | Freq: Four times a day (QID) | ORAL | Status: DC

## 2015-09-09 NOTE — Patient Instructions (Signed)

## 2015-09-09 NOTE — Progress Notes (Signed)
Subjective:  Patient ID: Victoria Travis, female    DOB: Jun 17, 1966  Age: 49 y.o. MRN: [id number]  CC: Sore Throat; Referral; and inside mouth swollen   HPI Victoria Travis presents   Patient has 2 complaints. She has sore throat and dysphagia. She has a low-grade fever. She has no nausea vomiting or stool change. She has no rash. No nasal congestion or postnasal drainage. No known cough wheezing or shortness of breath. She has a history of recurrent  Heartburn with radiation of pain into her chest. She has no nausea or vomiting or blood from her GI tract. She's been unable to control her symptoms with over-the-counter medication. She doesn't abuse alcohol or caffeine. She doesn't use excessive  Aspirin or nonsteroidal anti-inflammatories. She has food intolerance of greasy and fried food.  History Victoria Travis has a past medical history of Thyroid disease.   She has no past surgical history on file.   Her  family history is not on file.  She   reports that she has quit smoking. She has never used smokeless tobacco. She reports that she drinks about 2.4 oz of alcohol per week. She reports that she does not use illicit drugs.  Outpatient Prescriptions Prior to Visit  Medication Sig Dispense Refill  . levothyroxine (SYNTHROID, LEVOTHROID) 125 MCG tablet TAKE ONE TABLET BY MOUTH ONCE DAILY BEFORE BREAKFAST 30 tablet 12  . ondansetron (ZOFRAN-ODT) 8 MG disintegrating tablet Take 1 tablet (8 mg total) by mouth every 8 (eight) hours as needed for nausea or vomiting. 8 tablet 0   Facility-Administered Medications Prior to Visit  Medication Dose Route Frequency Provider Last Rate Last Dose  . triamcinolone acetonide (KENALOG-40) injection 40 mg  40 mg Intramuscular Once Carmelina Dane, MD        Social History   Social History  . Marital Status: Married    Spouse Name: N/A  . Number of Children: N/A  . Years of Education: N/A   Social History Main Topics  . Smoking status: Former Games developer    . Smokeless tobacco: Never Used  . Alcohol Use: 2.4 oz/week    4 Standard drinks or equivalent per week     Comment: beers nightly  . Drug Use: No  . Sexual Activity: Yes   Other Topics Concern  . None   Social History Narrative     Review of Systems  Constitutional: Negative for fever, chills and appetite change.  HENT: Positive for sore throat. Negative for congestion, ear pain, postnasal drip and sinus pressure.   Eyes: Negative for pain and redness.  Respiratory: Negative for cough, shortness of breath and wheezing.   Cardiovascular: Negative for leg swelling.  Gastrointestinal: Positive for nausea and abdominal pain. Negative for vomiting, diarrhea, constipation and blood in stool.  Endocrine: Negative for polyuria.  Genitourinary: Negative for dysuria, urgency, frequency and flank pain.  Musculoskeletal: Negative for gait problem.  Skin: Negative for rash.  Neurological: Negative for weakness and headaches.  Psychiatric/Behavioral: Negative for confusion and decreased concentration. The patient is not nervous/anxious.     Objective:  BP 122/80 mmHg  Pulse 91  Temp(Src) 98 F (36.7 C) (Oral)  Resp 16  Ht  (1.651 m)  Wt 171 lb (77.565 kg)  BMI 28.46 kg/m2  SpO2 97%  LMP 08/31/2015  Physical Exam  Constitutional: She is oriented to person, place, and time. She appears well-developed and well-nourished. No distress.  HENT:  Head: Normocephalic and atraumatic.  Right Ear: External ear normal.  Left Ear: External ear normal.  Nose: Nose normal.  Mouth/Throat: Posterior oropharyngeal edema and posterior oropharyngeal erythema present.  Eyes: Conjunctivae and EOM are normal. Pupils are equal, round, and reactive to light. No scleral icterus.  Neck: Normal range of motion. Neck supple. No tracheal deviation present.  Cardiovascular: Normal rate, regular rhythm and normal heart sounds.   Pulmonary/Chest: Effort normal. No respiratory distress. She has no  wheezes. She has no rales.  Abdominal: She exhibits no mass. There is no tenderness. There is no rebound and no guarding.  Musculoskeletal: She exhibits no edema.  Lymphadenopathy:    She has no cervical adenopathy.  Neurological: She is alert and oriented to person, place, and time. Coordination normal.  Skin: Skin is warm and dry. No rash noted.  Psychiatric: She has a normal mood and affect. Her behavior is normal.      Assessment & Plan:   Victoria Travis was seen today for sore throat, referral and inside mouth swollen.  Diagnoses and all orders for this visit:  Acute streptococcal pharyngitis -     Comprehensive metabolic panel -     CBC -     H. pylori breath test  Gastritis and gastroduodenitis -     Comprehensive metabolic panel -     CBC -     H. pylori breath test  Other orders -     penicillin v potassium (VEETID) 500 MG tablet; Take 1 tablet (500 mg total) by mouth 4 (four) times daily. -     lansoprazole (PREVACID) 30 MG capsule; Take 1 capsule (30 mg total) by mouth daily at 12 noon. -     sucralfate (CARAFATE) 1 G tablet; 1 tablet 1 hr ac and hs  I am having Victoria Travis start on penicillin v potassium, lansoprazole, and sucralfate. I am also having her maintain her levothyroxine and ondansetron. We will continue to administer triamcinolone acetonide.  Meds ordered this encounter  Medications  . penicillin v potassium (VEETID) 500 MG tablet    Sig: Take 1 tablet (500 mg total) by mouth 4 (four) times daily.    Dispense:  40 tablet    Refill:  0  . lansoprazole (PREVACID) 30 MG capsule    Sig: Take 1 capsule (30 mg total) by mouth daily at 12 noon.    Dispense:  30 capsule    Refill:  5  . sucralfate (CARAFATE) 1 G tablet    Sig: 1 tablet 1 hr ac and hs    Dispense:  120 tablet    Refill:  0    Appropriate red flag conditions were discussed with the patient as well as actions that should be taken.  Patient expressed his understanding.  Follow-up: Return if  symptoms worsen or fail to improve.  Carmelina DaneAnderson, Tria Noguera S, MD

## 2015-09-10 LAB — COMPREHENSIVE METABOLIC PANEL
ALBUMIN: 4.3 g/dL (ref 3.6–5.1)
ALT: 114 U/L — ABNORMAL HIGH (ref 6–29)
AST: 130 U/L — AB (ref 10–35)
Alkaline Phosphatase: 69 U/L (ref 33–115)
BILIRUBIN TOTAL: 0.5 mg/dL (ref 0.2–1.2)
BUN: 11 mg/dL (ref 7–25)
CHLORIDE: 98 mmol/L (ref 98–110)
CO2: 27 mmol/L (ref 20–31)
CREATININE: 0.72 mg/dL (ref 0.50–1.10)
Calcium: 9.2 mg/dL (ref 8.6–10.2)
Glucose, Bld: 99 mg/dL (ref 65–99)
Potassium: 4.3 mmol/L (ref 3.5–5.3)
SODIUM: 136 mmol/L (ref 135–146)
TOTAL PROTEIN: 7.6 g/dL (ref 6.1–8.1)

## 2015-09-10 LAB — CBC
HEMATOCRIT: 35.5 % — AB (ref 36.0–46.0)
Hemoglobin: 11.5 g/dL — ABNORMAL LOW (ref 12.0–15.0)
MCH: 25.9 pg — ABNORMAL LOW (ref 26.0–34.0)
MCHC: 32.4 g/dL (ref 30.0–36.0)
MCV: 80 fL (ref 78.0–100.0)
MPV: 9.5 fL (ref 8.6–12.4)
PLATELETS: 392 10*3/uL (ref 150–400)
RBC: 4.44 MIL/uL (ref 3.87–5.11)
RDW: 19.4 % — AB (ref 11.5–15.5)
WBC: 6.7 10*3/uL (ref 4.0–10.5)

## 2015-09-10 LAB — H. PYLORI BREATH TEST: H. pylori Breath Test: NOT DETECTED

## 2015-09-11 NOTE — Addendum Note (Signed)
Addended by: Carmelina DaneANDERSON, Yocelin Vanlue S on: 09/11/2015 08:35 AM   Modules accepted: Orders, SmartSet

## 2015-09-13 ENCOUNTER — Encounter: Payer: Self-pay | Admitting: Physician Assistant

## 2015-09-13 ENCOUNTER — Ambulatory Visit (INDEPENDENT_AMBULATORY_CARE_PROVIDER_SITE_OTHER): Payer: BLUE CROSS/BLUE SHIELD | Admitting: Physician Assistant

## 2015-09-13 VITALS — BP 143/85 | HR 90 | Temp 99.4°F | Resp 16 | Ht 65.75 in | Wt 171.0 lb

## 2015-09-13 DIAGNOSIS — Z0000 Encounter for general adult medical examination without abnormal findings: Secondary | ICD-10-CM | POA: Diagnosis not present

## 2015-09-13 DIAGNOSIS — Z113 Encounter for screening for infections with a predominantly sexual mode of transmission: Secondary | ICD-10-CM | POA: Diagnosis not present

## 2015-09-13 DIAGNOSIS — Z130 Encounter for screening for diseases of the blood and blood-forming organs and certain disorders involving the immune mechanism: Secondary | ICD-10-CM

## 2015-09-13 DIAGNOSIS — Z13220 Encounter for screening for lipoid disorders: Secondary | ICD-10-CM

## 2015-09-13 DIAGNOSIS — Z124 Encounter for screening for malignant neoplasm of cervix: Secondary | ICD-10-CM

## 2015-09-13 DIAGNOSIS — E039 Hypothyroidism, unspecified: Secondary | ICD-10-CM

## 2015-09-13 DIAGNOSIS — Z1231 Encounter for screening mammogram for malignant neoplasm of breast: Secondary | ICD-10-CM

## 2015-09-13 DIAGNOSIS — Z13228 Encounter for screening for other metabolic disorders: Secondary | ICD-10-CM | POA: Diagnosis not present

## 2015-09-13 LAB — COMPLETE METABOLIC PANEL WITH GFR
ALT: 71 U/L — ABNORMAL HIGH (ref 6–29)
AST: 56 U/L — ABNORMAL HIGH (ref 10–35)
Albumin: 4.2 g/dL (ref 3.6–5.1)
Alkaline Phosphatase: 66 U/L (ref 33–115)
BUN: 7 mg/dL (ref 7–25)
CO2: 26 mmol/L (ref 20–31)
Calcium: 8.4 mg/dL — ABNORMAL LOW (ref 8.6–10.2)
Chloride: 101 mmol/L (ref 98–110)
Creat: 0.65 mg/dL (ref 0.50–1.10)
GLUCOSE: 63 mg/dL — AB (ref 65–99)
POTASSIUM: 4.3 mmol/L (ref 3.5–5.3)
SODIUM: 138 mmol/L (ref 135–146)
Total Bilirubin: 0.4 mg/dL (ref 0.2–1.2)
Total Protein: 6.7 g/dL (ref 6.1–8.1)

## 2015-09-13 LAB — LIPID PANEL
CHOL/HDL RATIO: 2 ratio (ref <=5.0)
CHOLESTEROL: 217 mg/dL — AB (ref 125–200)
HDL: 108 mg/dL (ref >=46)
LDL Cholesterol: 94 mg/dL (ref <130)
Triglycerides: 73 mg/dL (ref <150)
VLDL: 15 mg/dL (ref <30)

## 2015-09-13 LAB — TSH: TSH: 4.408 u[IU]/mL (ref 0.350–4.500)

## 2015-09-13 LAB — CBC
HCT: 34.7 % — ABNORMAL LOW (ref 36.0–46.0)
Hemoglobin: 10.6 g/dL — ABNORMAL LOW (ref 12.0–15.0)
MCH: 25 pg — AB (ref 26.0–34.0)
MCHC: 30.5 g/dL (ref 30.0–36.0)
MCV: 81.8 fL (ref 78.0–100.0)
MPV: 10.1 fL (ref 8.6–12.4)
Platelets: 406 10*3/uL — ABNORMAL HIGH (ref 150–400)
RBC: 4.24 MIL/uL (ref 3.87–5.11)
RDW: 20.1 % — ABNORMAL HIGH (ref 11.5–15.5)
WBC: 4.6 10*3/uL (ref 4.0–10.5)

## 2015-09-13 NOTE — Progress Notes (Signed)
[facility] [address], [place] [id number] [phone]  Date:  09/13/2015   Name:  Victoria DecampKaren Travis   DOB:  [birth year]   MRN:  [id number]  PCP:  No PCP Per Patient    History of Present Illness:  Victoria Travis is a 49 y.o. female patient who [facility]la [facility].   She has no concerns or complaints at this time.    Diet: soups, vegetables, chicken.  Low on pastas and sugary stuff.    Bm: regular, no blood in the stool, constipation, diarrhea.    Urination: normal, no dysuria, hematuria, or frequency.  Sleep: goes to sleep fine, wakes at 2:30am and can not sleep, sleep exhaustion, but is up at 5:30am.  She will take sleeping pills every now and then.     --hospitality [facility].   --exercise: once every other week.    Etoh: 3 cocktails in the evening at home Illicit drug use: none Tobacco use: none    Menses has worsened over the last 6 years.  Described as "horrific", she has 5 days of heavy bleeding, and 3 days of full cramping.  Bright red clots.  Uses tampons only.  She has not used ocp since 2008.  Sexual activity with husband with family planning.  She was not able to get pregant.  She was followed by a fertility clinic, who found cyst on ovaries.  She does not recall ever having a  Pelvic pain: not painful, low cramping, mild little pings of pain.  Every day.  Walking     Patient Active Problem List   Diagnosis Date Noted  . 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 Cellulitis of foot 06/11/2012    Past Medical History  Diagnosis Date  . Thyroid disease     History reviewed. No pertinent past surgical history.  Social History  Substance Use Topics  . Smoking status: Former Games developermoker  . Smokeless tobacco: Never Used  . Alcohol Use: 2.4 oz/week    4 Standard drinks or equivalent per week     Comment: beers nightly    History reviewed. No pertinent family history.  No Known Allergies  Medication  list has been reviewed and updated.  Current Outpatient Prescriptions on File Prior to Visit  Medication Sig Dispense Refill  . lansoprazole (PREVACID) 30 MG capsule Take 1 capsule (30 mg total) by mouth daily at 12 noon. 30 capsule 5  . levothyroxine (SYNTHROID, LEVOTHROID) 125 MCG tablet TAKE ONE TABLET BY MOUTH ONCE DAILY BEFORE BREAKFAST 30 tablet 12  . penicillin v potassium (VEETID) 500 MG tablet Take 1 tablet (500 mg total) by mouth 4 (four) times daily. 40 tablet 0  . sucralfate (CARAFATE) 1 G tablet 1 tablet 1 hr ac and hs 120 tablet 0  . ondansetron (ZOFRAN-ODT) 8 MG disintegrating tablet Take 1 tablet (8 mg total) by mouth every 8 (eight) hours as needed for nausea or vomiting. (Patient not taking: Reported on 09/13/2015) 8 tablet 0   Current Facility-Administered Medications on File Prior to Visit  Medication Dose Route Frequency Provider Last Rate Last Dose  . triamcinolone acetonide (KENALOG-40) injection 40 mg  40 mg Intramuscular Once Carmelina DaneJeffery S Anderson, MD        Review of Systems  Constitutional: Negative for fever and chills.  HENT: Negative for ear discharge, ear pain and sore throat.   Eyes: Negative for blurred vision and double vision.  Respiratory: Negative for cough, shortness  of breath and wheezing.   Cardiovascular: Negative for chest pain, palpitations and leg swelling.  Gastrointestinal: Negative for nausea, vomiting and diarrhea.  Genitourinary: Negative for dysuria, frequency and hematuria.  Skin: Negative for itching and rash.  Neurological: Negative for dizziness and headaches.     Physical Examination: BP 143/85 mmHg  Pulse 90  Temp(Src) 99.4 F (37.4 C) (Oral)  Resp 16  Ht 5' 5.75" (1.67 m)  Wt 171 lb (77.565 kg)  BMI 27.81 kg/m2  SpO2 97%  LMP 08/31/2015 Ideal Body Weight: Weight in (lb) to have BMI = 25: 153.4  Physical Exam  Constitutional: She is oriented to person, place, and time. She appears well-developed and well-nourished. No  distress.  HENT:  Head: Normocephalic and atraumatic.  Right Ear: External ear normal.  Left Ear: External ear normal.  Eyes: Conjunctivae and EOM are normal. Pupils are equal, round, and reactive to light.  Cardiovascular: Normal rate and regular rhythm.  Exam reveals no gallop and no friction rub.   No murmur heard. Pulmonary/Chest: Effort normal and breath sounds normal. No respiratory distress. She has no wheezes. She has no rales.  Abdominal: Soft. Bowel sounds are normal. She exhibits no distension and no mass. There is no tenderness.  Genitourinary: Vagina normal. Pelvic exam was performed with patient supine. There is no rash on the right labia. There is no rash on the left labia. Cervix exhibits no motion tenderness, no discharge and no friability. Right adnexum displays no mass, no tenderness and no fullness. Left adnexum displays no mass, no tenderness and no fullness. No vaginal discharge found.  Musculoskeletal: Normal range of motion. She exhibits no edema or tenderness.  Neurological: She is alert and oriented to person, place, and time. She has normal reflexes. No cranial nerve deficit.  Skin: Skin is warm and dry. She is not diaphoretic. No erythema.  Psychiatric: She has a normal mood and affect. Her behavior is normal.     Assessment and Plan: Victoria Travis is a 49 y.o. female who is here today for annual physical exam. I am referring her to gynecology at this times.  Diff dx includes endometriosis, adenomyosis, uterine fibroid, malignancy (endometrium, ovarian).  -advised wean down of alcohol intake as this may be factor to insomnia.  Annual physical exam - Plan: COMPLETE METABOLIC PANEL WITH GFR, CBC, Lipid panel, HIV antibody, TSH, Pap IG w/ reflex to HPV when ASC-U, MM Digital Screening  Screening for metabolic disorder - Plan: COMPLETE METABOLIC PANEL WITH GFR  Hypothyroidism, unspecified hypothyroidism type - Plan: TSH  Screening for STD (sexually transmitted  disease) - Plan: HIV antibody  Screening for lipid disorders - Plan: Lipid panel  Screening for deficiency anemia - Plan: CBC  Screening for cervical cancer - Plan: Pap IG w/ reflex to HPV when ASC-U  Visit for screening mammogram - Plan: MM Digital Screening    Trena Platt, PA-C [facility] 09/13/2015 3:50 PM

## 2015-09-13 NOTE — Patient Instructions (Signed)
I will have your lab results within the next 10 days. I would like you decrease your alcohol intake at night.  This may be the source of your inability to stay asleep.  I would like you to wean down to 1 drink at night over the next 2 weeks.  Then try without for a few nights.  If you need assistance in this process, let us know. Please await contact for your mammogram as well as appointment with gynecologist. Keeping You Healthy  Get These Tests 1. Blood Pressure- Have your blood pressure checked once a year by your health care provider.  Normal blood pressure is 120/80. 2. Weight- Have your body mass index (BMI) calculated to screen for obesity.  BMI is measure of body fat based on height and weight.  You can also calculate your own BMI at https://www.west-esparza.com/. 3. Cholesterol- Have your cholesterol checked every 5 years starting at age 73 then yearly starting at age 32. 4. Chlamydia, HIV, and other sexually transmitted diseases- Get screened every year until age 66, then within three months of each new sexual provider. 5. Pap Test - Every 1-5 years; discuss with your health care provider. 6. Mammogram- Every 1-2 years starting at age 39--50  Take these medicines  Calcium with Vitamin D-Your body needs 1200 mg of Calcium each day and [phone] IU of Vitamin D daily.  Your body can only absorb 500 mg of Calcium at a time so Calcium must be taken in 2 or 3 divided doses throughout the day.  Multivitamin with folic acid- Once daily if it is possible for you to become pregnant.  Get these Immunizations  Gardasil-Series of three doses; prevents HPV related illness such as genital warts and cervical cancer.  Menactra-Single dose; prevents meningitis.  Tetanus shot- Every 10 years.  Flu shot-Every year.  Take these steps 1. Do not smoke-Your healthcare provider can help you quit.  For tips on how to quit go to www.smokefree.gov or call 1-800 QUITNOW. 2. Be physically active- Exercise 5  days a week for at least 30 minutes.  If you are not already physically active, start slow and gradually work up to 30 minutes of moderate physical activity.  Examples of moderate activity include walking briskly, dancing, swimming, bicycling, etc. 3. Breast Cancer- A self breast exam every month is important for early detection of breast cancer.  For more information and instruction on self breast exams, ask your healthcare provider or [email or web address]. 4. Eat a healthy diet- Eat a variety of healthy foods such as fruits, vegetables, whole grains, low fat milk, low fat cheeses, yogurt, lean meats, poultry and fish, beans, nuts, tofu, etc.  For more information go to www. Thenutritionsource.org 5. Drink alcohol in moderation- Limit alcohol intake to one drink or less per day. Never drink and drive. 6. Depression- Your emotional health is as important as your physical health.  If you're feeling down or losing interest in things you normally enjoy please talk to your healthcare provider about being screened for depression. 7. Dental visit- Brush and floss your teeth twice daily; visit your dentist twice a year. 8. Eye doctor- Get an eye exam at least every 2 years. 9. Helmet use- Always wear a helmet when riding a bicycle, motorcycle, rollerblading or skateboarding. 10. Safe sex- If you may be exposed to sexually transmitted infections, use a condom. 11. Seat belts- Seat belts can save your live; always wear one. 12. Smoke/Carbon Monoxide detectors- These detectors need to be installed on the  appropriate level of your home. Replace batteries at least once a year. 13. Skin cancer- When out in the sun please cover up and use sunscreen 15 SPF or higher. 14. Violence- If anyone is threatening or hurting you, please tell your healthcare provider.

## 2015-09-14 ENCOUNTER — Other Ambulatory Visit: Payer: Self-pay

## 2015-09-14 DIAGNOSIS — Z1231 Encounter for screening mammogram for malignant neoplasm of breast: Secondary | ICD-10-CM

## 2015-09-14 LAB — HIV ANTIBODY (ROUTINE TESTING W REFLEX): HIV: NONREACTIVE

## 2015-09-14 LAB — PAP IG W/ RFLX HPV ASCU

## 2015-10-05 ENCOUNTER — Ambulatory Visit: Payer: BLUE CROSS/BLUE SHIELD

## 2015-10-20 ENCOUNTER — Ambulatory Visit: Payer: BLUE CROSS/BLUE SHIELD

## 2015-11-01 ENCOUNTER — Ambulatory Visit
Admission: RE | Admit: 2015-11-01 | Discharge: 2015-11-01 | Disposition: A | Discharge status: Home / Self Care | Payer: BLUE CROSS/BLUE SHIELD | Source: Ambulatory Visit | Attending: Physician Assistant | Admitting: Physician Assistant

## 2015-11-01 DIAGNOSIS — Z1231 Encounter for screening mammogram for malignant neoplasm of breast: Secondary | ICD-10-CM

## 2016-01-20 ENCOUNTER — Telehealth: Payer: Self-pay

## 2016-01-20 ENCOUNTER — Other Ambulatory Visit: Payer: Self-pay

## 2016-01-20 MED ORDER — LEVOTHYROXINE SODIUM 125 MCG PO TABS: ORAL_TABLET | ORAL | Status: DC

## 2016-01-20 NOTE — Telephone Encounter (Signed)
Rx sent in already

## 2016-01-20 NOTE — Telephone Encounter (Signed)
Victoria Travis  Refill of levothyroxine (SYNTHROID, LEVOTHROID) 125 MCG tablet   [phone]

## 2016-02-13 ENCOUNTER — Emergency Department (HOSPITAL_BASED_OUTPATIENT_CLINIC_OR_DEPARTMENT_OTHER)
Admission: EM | Admit: 2016-02-13 | Discharge: 2016-02-13 | Disposition: A | Discharge status: Home / Self Care | Payer: BLUE CROSS/BLUE SHIELD | Attending: Emergency Medicine | Admitting: Emergency Medicine

## 2016-02-13 ENCOUNTER — Encounter (HOSPITAL_BASED_OUTPATIENT_CLINIC_OR_DEPARTMENT_OTHER): Payer: Self-pay

## 2016-02-13 DIAGNOSIS — S61512A Laceration without foreign body of left wrist, initial encounter: Secondary | ICD-10-CM | POA: Insufficient documentation

## 2016-02-13 DIAGNOSIS — Z87891 Personal history of nicotine dependence: Secondary | ICD-10-CM | POA: Insufficient documentation

## 2016-02-13 DIAGNOSIS — Y929 Unspecified place or not applicable: Secondary | ICD-10-CM | POA: Insufficient documentation

## 2016-02-13 DIAGNOSIS — W268XXA Contact with other sharp object(s), not elsewhere classified, initial encounter: Secondary | ICD-10-CM | POA: Insufficient documentation

## 2016-02-13 DIAGNOSIS — Y999 Unspecified external cause status: Secondary | ICD-10-CM | POA: Insufficient documentation

## 2016-02-13 DIAGNOSIS — IMO0002 Reserved for concepts with insufficient information to code with codable children: Secondary | ICD-10-CM

## 2016-02-13 DIAGNOSIS — S66922A Laceration of unspecified muscle, fascia and tendon at wrist and hand level, left hand, initial encounter: Secondary | ICD-10-CM

## 2016-02-13 DIAGNOSIS — Y9389 Activity, other specified: Secondary | ICD-10-CM | POA: Insufficient documentation

## 2016-02-13 DIAGNOSIS — S61502A Unspecified open wound of left wrist, initial encounter: Secondary | ICD-10-CM

## 2016-02-13 MED ORDER — LIDOCAINE HCL 2 % IJ SOLN
20.0000 mL | Freq: Once | INTRAMUSCULAR | Status: AC
  Administered 2016-02-13: 400 mg via INTRADERMAL
  Filled 2016-02-13: qty 20

## 2016-02-13 MED ORDER — CEPHALEXIN 500 MG PO CAPS: 500.0000 mg | ORAL_CAPSULE | Freq: Four times a day (QID) | ORAL | Status: DC

## 2016-02-13 MED ORDER — IBUPROFEN 800 MG PO TABS: 800.0000 mg | ORAL_TABLET | Freq: Three times a day (TID) | ORAL | Status: DC | PRN

## 2016-02-13 NOTE — ED Notes (Signed)
Received laceration approx 1 hour ago, cut on glass bowl, arrived with bandage and "butterfly" strips x 2 on laceration at left wrist, bleeding controlled at this time.

## 2016-02-13 NOTE — ED Notes (Signed)
Comfort measures provided, pt reassured

## 2016-02-13 NOTE — Discharge Instructions (Signed)
Return here as needed.  Go to Dr. Carlos LeveringGramig's office tomorrow at 1145 for a 12 PM appointment

## 2016-02-13 NOTE — ED Notes (Signed)
Good capillary refill noted in all of the left digits, denies any tingling or numbness of any digit, able to touch each finger to thumb w/o difficulty, able to grip nurses fingers w/o difficulty as well.

## 2016-02-13 NOTE — ED Notes (Signed)
PA at bedside.

## 2016-02-13 NOTE — ED Notes (Signed)
2 cm laceration to left wrist. Bleeding controlled. Unknown last tetanus

## 2016-02-13 NOTE — ED Provider Notes (Signed)
CSN: [id number]     Arrival date & time 02/13/16  1148 History   First MD Initiated Contact with Patient 02/13/16 1221     Chief Complaint  Patient presents with  . Extremity Laceration     (Consider location/radiation/quality/duration/timing/severity/associated sxs/prior Treatment) HPI Patient presents to the emergency department with a laceration to the left wrist.  The patient states she was attempting to get a bowl off of a shelf when it slipped and broke cutting her left wrist.  Patient states that she applied pressure to control the bleeding.  Patient does not have any numbness or weakness in her hand Past Medical History  Diagnosis Date  . Thyroid disease    History reviewed. No pertinent past surgical history. No family history on file. Social History  Substance Use Topics  . Smoking status: Former Games developer  . Smokeless tobacco: Never Used  . Alcohol Use: 2.4 oz/week    4 Standard drinks or equivalent per week     Comment: beers nightly   OB History    No data available     Review of Systems  All other systems negative except as documented in the HPI. All pertinent positives and negatives as reviewed in the HPI.  Allergies  Review of patient's allergies indicates no known allergies.  Home Medications   Prior to Admission medications   Medication Sig Start Date End Date Taking? Authorizing Provider  lansoprazole (PREVACID) 30 MG capsule Take 1 capsule (30 mg total) by mouth daily at 12 noon. 09/09/15   Carmelina Dane, MD  levothyroxine (SYNTHROID, LEVOTHROID) 125 MCG tablet TAKE ONE TABLET BY MOUTH ONCE DAILY BEFORE BREAKFAST 01/20/16   Collie Siad English, PA  ondansetron (ZOFRAN-ODT) 8 MG disintegrating tablet Take 1 tablet (8 mg total) by mouth every 8 (eight) hours as needed for nausea or vomiting. Patient not taking: Reported on 09/13/2015 09/07/15   Benjiman Core, MD  penicillin v potassium (VEETID) 500 MG tablet Take 1 tablet (500 mg total) by mouth 4  (four) times daily. 09/09/15   Carmelina Dane, MD  sucralfate (CARAFATE) 1 G tablet 1 tablet 1 hr ac and hs 09/09/15   Carmelina Dane, MD   BP 142/92 mmHg  Pulse 68  Temp(Src) 98.7 F (37.1 C) (Oral)  Resp 18  Ht  (1.676 m)  Wt 72.576 kg  BMI 25.84 kg/m2  SpO2 98%  LMP 01/28/2016 Physical Exam  Constitutional: She appears well-developed and well-nourished. No distress.  HENT:  Head: Normocephalic and atraumatic.  Musculoskeletal:       Left hand: She exhibits tenderness and laceration. She exhibits normal range of motion, normal two-point discrimination, normal capillary refill, no deformity and no swelling. Normal sensation noted. Normal strength noted.       Hands: Nursing note and vitals reviewed.   ED Course  Procedures (including critical care time) Labs Review Labs Reviewed - No data to display   LACERATION REPAIR Performed by: Carlyle Dolly Authorized by: Carlyle Dolly Consent: Verbal consent obtained. Risks and benefits: risks, benefits and alternatives were discussed Consent given by: patient Patient identity confirmed: provided demographic data Prepped and Draped in normal sterile fashion Wound explored  Laceration Location: Dorsum of left medial wrist  Laceration Length: 3 cm  No Foreign Bodies seen or palpated  Anesthesia: local infiltration  Local anesthetic: lidocaine 2 % without epinephrine  Anesthetic total: 5 ml  Irrigation method: syringe Amount of cleaning: standard  Skin closure: 4-0 Prolene   Number of sutures: 3  Technique: Simple interrupted   Patient tolerance: Patient tolerated the procedure well with no immediate complications.  I spoke with Dr. Amanda PeaGramig, who is on for hand surgery.  He advised to loosely approximate the wound place her in a Velcro wrist splint.  Place her on Keflex and have her follow-up tomorrow at 12 noon in his office  Appears that she lacerated the flexor carpi radialis  tendon  Charlestine Nighthristopher Dejon Jungman, PA-C 02/13/16 1431  Charlestine Nighthristopher Ecko Beasley, PA-C 02/13/16 1434  Leta BaptistEmily Roe Nguyen, MD 02/17/16 [phone]

## 2016-02-13 NOTE — ED Notes (Addendum)
States unknown about Tetanus Status

## 2016-03-07 ENCOUNTER — Ambulatory Visit (INDEPENDENT_AMBULATORY_CARE_PROVIDER_SITE_OTHER): Payer: BLUE CROSS/BLUE SHIELD | Admitting: Physician Assistant

## 2016-03-07 VITALS — BP 134/76 | HR 93 | Temp 98.0°F | Resp 16 | Ht 66.0 in | Wt 165.0 lb

## 2016-03-07 DIAGNOSIS — R109 Unspecified abdominal pain: Secondary | ICD-10-CM

## 2016-03-07 LAB — POCT URINALYSIS DIP (MANUAL ENTRY)
BILIRUBIN UA: NEGATIVE
GLUCOSE UA: NEGATIVE
Ketones, POC UA: NEGATIVE
Leukocytes, UA: NEGATIVE
Nitrite, UA: NEGATIVE
PH UA: 5.5
Protein Ur, POC: NEGATIVE
UROBILINOGEN UA: 0.2

## 2016-03-07 LAB — POCT URINE PREGNANCY: Preg Test, Ur: NEGATIVE

## 2016-03-07 LAB — POCT WET + KOH PREP
Trich by wet prep: ABSENT
YEAST BY KOH: ABSENT
YEAST BY WET PREP: ABSENT

## 2016-03-07 LAB — CBC
HEMATOCRIT: 36.3 % (ref 35.0–45.0)
Hemoglobin: 11.4 g/dL — ABNORMAL LOW (ref 11.7–15.5)
MCH: 25.1 pg — ABNORMAL LOW (ref 27.0–33.0)
MCHC: 31.4 g/dL — AB (ref 32.0–36.0)
MCV: 79.8 fL — AB (ref 80.0–100.0)
MPV: 9.5 fL (ref 7.5–12.5)
Platelets: 460 10*3/uL — ABNORMAL HIGH (ref 140–400)
RBC: 4.55 MIL/uL (ref 3.80–5.10)
RDW: 19.8 % — AB (ref 11.0–15.0)
WBC: 6.7 10*3/uL (ref 3.8–10.8)

## 2016-03-07 LAB — POC MICROSCOPIC URINALYSIS (UMFC): MUCUS RE: ABSENT

## 2016-03-07 LAB — POCT CBC
Granulocyte percent: 64.2 %G (ref 37–80)
HEMATOCRIT: 35.1 % — AB (ref 37.7–47.9)
HEMOGLOBIN: 11.5 g/dL — AB (ref 12.2–16.2)
LYMPH, POC: 2.3 (ref 0.6–3.4)
MCH: 25.9 pg — AB (ref 27–31.2)
MCHC: 32.6 g/dL (ref 31.8–35.4)
MCV: 79.4 fL — AB (ref 80–97)
MID (cbc): 0.2 (ref 0–0.9)
MPV: 7.7 fL (ref 0–99.8)
POC GRANULOCYTE: 4.5 (ref 2–6.9)
POC LYMPH PERCENT: 32.7 %L (ref 10–50)
POC MID %: 3.1 % (ref 0–12)
Platelet Count, POC: 401 10*3/uL (ref 142–424)
RBC: 4.43 M/uL (ref 4.04–5.48)
RDW, POC: 21.5 %
WBC: 7 10*3/uL (ref 4.6–10.2)

## 2016-03-07 LAB — COMPLETE METABOLIC PANEL WITH GFR
ALT: 124 U/L — ABNORMAL HIGH (ref 6–29)
AST: 144 U/L — ABNORMAL HIGH (ref 10–35)
Albumin: 4.1 g/dL (ref 3.6–5.1)
Alkaline Phosphatase: 67 U/L (ref 33–115)
BUN: 8 mg/dL (ref 7–25)
CALCIUM: 9.4 mg/dL (ref 8.6–10.2)
CHLORIDE: 96 mmol/L — AB (ref 98–110)
CO2: 24 mmol/L (ref 20–31)
Creat: 0.64 mg/dL (ref 0.50–1.10)
Glucose, Bld: 75 mg/dL (ref 65–99)
POTASSIUM: 4.2 mmol/L (ref 3.5–5.3)
Sodium: 137 mmol/L (ref 135–146)
Total Bilirubin: 0.6 mg/dL (ref 0.2–1.2)
Total Protein: 6.8 g/dL (ref 6.1–8.1)

## 2016-03-07 LAB — HCG, QUANTITATIVE, PREGNANCY: hCG, Beta Chain, Quant, S: <2 m[IU]/mL

## 2016-03-07 LAB — LIPASE: LIPASE: 25 U/L (ref 7–60)

## 2016-03-07 MED ORDER — HYDROCODONE-ACETAMINOPHEN 7.5-325 MG PO TABS: 1.0000 | ORAL_TABLET | Freq: Four times a day (QID) | ORAL | Status: DC | PRN

## 2016-03-07 MED ORDER — KETOROLAC TROMETHAMINE 60 MG/2ML IM SOLN
60.0000 mg | Freq: Once | INTRAMUSCULAR | Status: AC
  Administered 2016-03-07: 60 mg via INTRAMUSCULAR

## 2016-03-07 NOTE — Patient Instructions (Signed)
     IF you received an x-ray today, you will receive an invoice from [facility]. Please contact [facility] at [phone] with questions or concerns regarding your invoice.   IF you received labwork today, you will receive an invoice from [facility]. Please contact [facility] at [phone] with questions or concerns regarding your invoice.   Our billing staff will not be able to assist you with questions regarding bills from these companies.  You will be contacted with the lab results as soon as they are available. The fastest way to get your results is to activate your My Chart account. Instructions are located on the last page of this paperwork. If you have not heard from us regarding the results in 2 weeks, please contact this office.      

## 2016-03-07 NOTE — Progress Notes (Signed)
03/07/2016 3:28 PM   DOB: 07/24/1966 / MRN: [id number]  SUBJECTIVE:  Victoria Travis is a 50 y.o. female presenting for abdominal pain. This began 6 days ago and started with her perioid, which she reports to me was somewhat late this time, and she has historically been very regular. She says the pain is similar to her periods however more severe and the duration is longer.  She complains of cramp like pain, nausea, emesis, poor appetite, and feeling emotionally charged.  Denies constipation, SOB, chest pain.      She has No Known Allergies.   She  has a past medical history of Thyroid disease.    She  reports that she has quit smoking. She has never used smokeless tobacco. She reports that she drinks about 2.4 oz of alcohol per week. She reports that she does not use illicit drugs. She  reports that she currently engages in sexual activity. The patient  has no past surgical history on file.  Her family history is not on file.  Review of Systems  Constitutional: Negative for fever and chills.  Respiratory: Negative for cough.   Gastrointestinal: Negative for nausea.  Genitourinary: Negative for urgency.  Musculoskeletal: Negative for myalgias and neck pain.  Skin: Negative for rash.  Neurological: Negative for dizziness and headaches.    Problem list and medications reviewed and updated by myself where necessary, and exist elsewhere in the encounter.   OBJECTIVE:  BP 134/76 mmHg  Pulse 93  Temp(Src) 98 F (36.7 C)  Resp 16  Ht 5\' 6"  (1.676 m)  Wt 165 lb (74.844 kg)  BMI 26.64 kg/m2  SpO2 98%  LMP 03/07/2016  Physical Exam  Constitutional: She is oriented to person, place, and time. She appears well-nourished. No distress.  Eyes: EOM are normal. Pupils are equal, round, and reactive to light.  Cardiovascular: Normal rate.   Pulmonary/Chest: Effort normal.  Abdominal: Soft. Bowel sounds are normal. She exhibits no distension and no mass. There is no tenderness. There is no  rebound and no guarding.  Genitourinary:    Neurological: She is alert and oriented to person, place, and time. No cranial nerve deficit. Gait normal.  Skin: Skin is dry. She is not diaphoretic.  Psychiatric: She has a normal mood and affect.  Vitals reviewed.   Results for orders placed or performed in visit on 03/07/16 (from the past 72 hour(s))  POCT urinalysis dipstick     Status: Abnormal   Collection Time: 03/07/16  1:28 PM  Result Value Ref Range   Color, UA yellow yellow   Clarity, UA clear clear   Glucose, UA negative negative   Bilirubin, UA negative negative   Ketones, POC UA negative negative   Spec Grav, UA <=1.005    Blood, UA moderate (A) negative   pH, UA 5.5    Protein Ur, POC negative negative   Urobilinogen, UA 0.2    Nitrite, UA Negative Negative   Leukocytes, UA Negative Negative  POCT Microscopic Urinalysis ([facility])     Status: Abnormal   Collection Time: 03/07/16  1:30 PM  Result Value Ref Range   WBC,UR,HPF,POC None None WBC/hpf   RBC,UR,HPF,POC Few (A) None RBC/hpf   Bacteria Few (A) None, Too numerous to count   Mucus Absent Absent   Epithelial Cells, UR Per Microscopy Few (A) None, Too numerous to count cells/hpf  POCT Wet + KOH Prep     Status: Abnormal   Collection Time: 03/07/16  3:05 PM  Result  Value Ref Range   Yeast by KOH Absent Present, Absent   Yeast by wet prep Absent Present, Absent   WBC by wet prep Few None, Few, Too numerous to count   Clue Cells Wet Prep HPF POC None None, Too numerous to count   Trich by wet prep Absent Present, Absent   Bacteria Wet Prep HPF POC Many (A) None, Few, Too numerous to count   Epithelial Cells By [employer] ([facility]) Many (A) None, Few, Too numerous to count   RBC,UR,HPF,POC Too numerous to count  (A) None RBC/hpf  POCT urine pregnancy     Status: None   Collection Time: 03/07/16  3:06 PM  Result Value Ref Range   Preg Test, Ur Negative Negative  POCT CBC     Status: Abnormal   Collection Time:  03/07/16  3:23 PM  Result Value Ref Range   WBC 7.0 4.6 - 10.2 K/uL   Lymph, poc 2.3 0.6 - 3.4   POC LYMPH PERCENT 32.7 10 - 50 %L   MID (cbc) 0.2 0 - 0.9   POC MID % 3.1 0 - 12 %M   POC Granulocyte 4.5 2 - 6.9   Granulocyte percent 64.2 37 - 80 %G   RBC 4.43 4.04 - 5.48 M/uL   Hemoglobin 11.5 (A) 12.2 - 16.2 g/dL   HCT, POC 16.1 (A) 09.6 - 47.9 %   MCV 79.4 (A) 80 - 97 fL   MCH, POC 25.9 (A) 27 - 31.2 pg   MCHC 32.6 31.8 - 35.4 g/dL   RDW, POC 04.5 %   Platelet Count, POC 401 142 - 424 K/uL   MPV 7.7 0 - 99.8 fL    No results found.  ASSESSMENT AND PLAN  Victoria Travis was seen today for abdominal pain.  Diagnoses and all orders for this visit:  Abdominal pain, unspecified abdominal location: She has a stable H&H. Most likely menorrhagia 2/2 perimenopause.  Treatment options discussed.  Advised that she begins to develop a pattern of menorrhagia then we may want to consider other medication or referral options.  -     POCT Microscopic Urinalysis ([facility]) -     POCT urinalysis dipstick -     CBC -     COMPLETE METABOLIC PANEL WITH GFR -     Lipase -     POCT Wet + KOH Prep -     POCT urine pregnancy -     hCG, quantitative, pregnancy    The patient was advised to call or return to clinic if she does not see an improvement in symptoms or to seek the care of the closest emergency department if she worsens with the above plan.   Deliah Boston, MHS, PA-C [facility] 03/07/2016 3:28 PM

## 2016-03-10 ENCOUNTER — Other Ambulatory Visit: Payer: Self-pay | Admitting: Physician Assistant

## 2016-03-10 DIAGNOSIS — R740 Nonspecific elevation of levels of transaminase and lactic acid dehydrogenase [LDH]: Principal | ICD-10-CM

## 2016-03-10 DIAGNOSIS — R7401 Elevation of levels of liver transaminase levels: Secondary | ICD-10-CM

## 2016-05-12 ENCOUNTER — Other Ambulatory Visit: Payer: Self-pay | Admitting: Physician Assistant

## 2016-07-21 ENCOUNTER — Ambulatory Visit (INDEPENDENT_AMBULATORY_CARE_PROVIDER_SITE_OTHER): Payer: Self-pay | Admitting: Family Medicine

## 2016-07-21 ENCOUNTER — Encounter: Payer: Self-pay | Admitting: Family Medicine

## 2016-07-21 VITALS — BP 124/80 | HR 94 | Temp 98.1°F | Resp 18 | Ht 66.0 in | Wt 165.0 lb

## 2016-07-21 DIAGNOSIS — R300 Dysuria: Secondary | ICD-10-CM

## 2016-07-21 DIAGNOSIS — N390 Urinary tract infection, site not specified: Secondary | ICD-10-CM

## 2016-07-21 LAB — POCT URINALYSIS DIP (MANUAL ENTRY)
Glucose, UA: NEGATIVE
Nitrite, UA: POSITIVE — AB
SPEC GRAV UA: 1.025
Urobilinogen, UA: 0.2
pH, UA: 6

## 2016-07-21 LAB — POC MICROSCOPIC URINALYSIS (UMFC): Mucus: ABSENT

## 2016-07-21 MED ORDER — SULFAMETHOXAZOLE-TRIMETHOPRIM 800-160 MG PO TABS: 1.0000 | ORAL_TABLET | Freq: Two times a day (BID) | ORAL | 0 refills | Status: AC

## 2016-07-21 NOTE — Patient Instructions (Addendum)
   IF you received an x-ray today, you will receive an invoice from [facility]. Please contact [facility] at [phone] with questions or concerns regarding your invoice.   IF you received labwork today, you will receive an invoice from [facility]. Please contact [facility] at [phone] with questions or concerns regarding your invoice.   Our billing staff will not be able to assist you with questions regarding bills from these companies.  You will be contacted with the lab results as soon as they are available. The fastest way to get your results is to activate your My Chart account. Instructions are located on the last page of this paperwork. If you have not heard from us regarding the results in 2 weeks, please contact this office.     Urinary Tract Infection Urinary tract infections (UTIs) can develop anywhere along your urinary tract. Your urinary tract is your body's drainage system for removing wastes and extra water. Your urinary tract includes two kidneys, two ureters, a bladder, and a urethra. Your kidneys are a pair of bean-shaped organs. Each kidney is about the size of your fist. They are located below your ribs, one on each side of your spine. CAUSES Infections are caused by microbes, which are microscopic organisms, including fungi, viruses, and bacteria. These organisms are so small that they can only be seen through a microscope. Bacteria are the microbes that most commonly cause UTIs. SYMPTOMS  Symptoms of UTIs may vary by age and gender of the patient and by the location of the infection. Symptoms in young women typically include a frequent and intense urge to urinate and a painful, burning feeling in the bladder or urethra during urination. Older women and men are more likely to be tired, shaky, and weak and have muscle aches and abdominal pain. A fever may mean the infection is in your kidneys. Other symptoms of a kidney  infection include pain in your back or sides below the ribs, nausea, and vomiting. DIAGNOSIS To diagnose a UTI, your caregiver will ask you about your symptoms. Your caregiver will also ask you to provide a urine sample. The urine sample will be tested for bacteria and white blood cells. White blood cells are made by your body to help fight infection. TREATMENT  Typically, UTIs can be treated with medication. Because most UTIs are caused by a bacterial infection, they usually can be treated with the use of antibiotics. The choice of antibiotic and length of treatment depend on your symptoms and the type of bacteria causing your infection. HOME CARE INSTRUCTIONS  If you were prescribed antibiotics, take them exactly as your caregiver instructs you. Finish the medication even if you feel better after you have only taken some of the medication.  Drink enough water and fluids to keep your urine clear or pale yellow.  Avoid caffeine, tea, and carbonated beverages. They tend to irritate your bladder.  Empty your bladder often. Avoid holding urine for long periods of time.  Empty your bladder before and after sexual intercourse.  After a bowel movement, women should cleanse from front to back. Use each tissue only once. SEEK MEDICAL CARE IF:   You have back pain.  You develop a fever.  Your symptoms do not begin to resolve within 3 days. SEEK IMMEDIATE MEDICAL CARE IF:   You have severe back pain or lower abdominal pain.  You develop chills.  You have nausea or vomiting.  You have continued burning or discomfort with urination.   MAKE SURE YOU:   Understand these instructions.  Will watch your condition.  Will get help right away if you are not doing well or get worse.   This information is not intended to replace advice given to you by your health care provider. Make sure you discuss any questions you have with your health care provider.   Document Released: 07/05/2005 Document  Revised: 06/16/2015 Document Reviewed: 11/03/2011 Elsevier Interactive Patient Education 2016 Elsevier Inc.  

## 2016-07-21 NOTE — Progress Notes (Signed)
  Chief Complaint  Patient presents with  . Urinary Frequency    bladder leaking  . Dysuria    HPI  Urinary Tract Infection: Patient complains of dysuria, erythema of vaginal area, foul smelling urine, frequency and incomplete bladder emptying She has had symptoms for 5 days. Patient has been taking Azo, echinacea and [facility] without [facility]. Patient denies fever and vaginal discharge. Patient does not have a history of recurrent UTI.  Patient does not have a history of pyelonephritis.   Past Medical History:  Diagnosis Date  . Thyroid disease     Current Outpatient Prescriptions  Medication Sig Dispense Refill  . levothyroxine (SYNTHROID, LEVOTHROID) 125 MCG tablet TAKE ONE TABLET BY MOUTH ONCE DAILY BEFORE BREAKFAST 90 tablet 0  . HYDROcodone-acetaminophen (NORCO) 7.5-325 MG tablet Take 1 tablet by mouth every 6 (six) hours as needed for severe pain. No refills (Patient not taking: Reported on 07/21/2016) 12 tablet 0  . sucralfate (CARAFATE) 1 G tablet 1 tablet 1 hr ac and hs (Patient not taking: Reported on 07/21/2016) 120 tablet 0  . sulfamethoxazole-trimethoprim (BACTRIM DS,SEPTRA DS) 800-160 MG tablet Take 1 tablet by mouth 2 (two) times daily. 14 tablet 0   Current Facility-Administered Medications  Medication Dose Route Frequency Provider Last Rate Last Dose  . triamcinolone acetonide (KENALOG-40) injection 40 mg  40 mg Intramuscular Once Carmelina DaneJeffery S Anderson, MD        Allergies: No Known Allergies  History reviewed. No pertinent surgical history.  Social History   Social History  . Marital status: Married    Spouse name: N/A  . Number of children: N/A  . Years of education: N/A   Social History Main Topics  . Smoking status: Former Games developermoker  . Smokeless tobacco: Never Used  . Alcohol use 2.4 oz/week    4 Standard drinks or equivalent per week     Comment: beers nightly  . Drug use: No  . Sexual activity: Yes   Other Topics Concern  . None    Social History Narrative  . None    ROS See hpi  Objective: Vitals:   07/21/16 1349  BP: 124/80  Pulse: 94  Resp: 18  Temp: 98.1 F (36.7 C)  TempSrc: Oral  SpO2: 99%  Weight: 165 lb (74.8 kg)  Height: 5\' 6"  (1.676 m)    Physical Exam  Constitutional: She is oriented to person, place, and time. She appears well-developed and well-nourished.  HENT:  Head: Normocephalic and atraumatic.  Eyes: Conjunctivae and EOM are normal.  Cardiovascular: Normal rate, regular rhythm and normal heart sounds.   Pulmonary/Chest: Effort normal and breath sounds normal. No respiratory distress.  Abdominal: Soft. Bowel sounds are normal. She exhibits no distension.  No flank pain or suprapubic tenderness  Neurological: She is alert and oriented to person, place, and time.    Assessment and Plan Clydie BraunKaren was seen today for urinary frequency and dysuria.  Diagnoses and all orders for this visit:  Dysuria -     POCT Microscopic Urinalysis ([facility]) -     POCT urinalysis dipstick  Acute UTI Will treat with Bactrim for UTI Advised probiotics -     sulfamethoxazole-trimethoprim (BACTRIM DS,SEPTRA DS) 800-160 MG tablet; Take 1 tablet by mouth 2 (two) times daily.     Keenan Trefry A Thresia Ramanathan

## 2016-09-18 ENCOUNTER — Other Ambulatory Visit: Payer: Self-pay | Admitting: Physician Assistant

## 2016-09-22 ENCOUNTER — Other Ambulatory Visit: Payer: Self-pay | Admitting: Physician Assistant

## 2016-11-02 ENCOUNTER — Ambulatory Visit (INDEPENDENT_AMBULATORY_CARE_PROVIDER_SITE_OTHER): Payer: Self-pay | Admitting: Physician Assistant

## 2016-11-02 VITALS — BP 142/102 | HR 80 | Temp 98.2°F | Resp 16 | Ht 66.0 in | Wt 166.0 lb

## 2016-11-02 DIAGNOSIS — R300 Dysuria: Secondary | ICD-10-CM

## 2016-11-02 DIAGNOSIS — E039 Hypothyroidism, unspecified: Secondary | ICD-10-CM

## 2016-11-02 LAB — POCT URINALYSIS DIP (MANUAL ENTRY)
BILIRUBIN UA: NEGATIVE
Glucose, UA: NEGATIVE
Nitrite, UA: POSITIVE — AB
PH UA: 5.5
Protein Ur, POC: 30 — AB
Urobilinogen, UA: 1

## 2016-11-02 LAB — POC MICROSCOPIC URINALYSIS (UMFC): MUCUS RE: ABSENT

## 2016-11-02 MED ORDER — LEVOTHYROXINE SODIUM 125 MCG PO TABS: 125.0000 ug | ORAL_TABLET | Freq: Every day | ORAL | 5 refills | Status: DC

## 2016-11-02 MED ORDER — SULFAMETHOXAZOLE-TRIMETHOPRIM 800-160 MG PO TABS: 1.0000 | ORAL_TABLET | Freq: Two times a day (BID) | ORAL | 0 refills | Status: DC

## 2016-11-02 NOTE — Progress Notes (Signed)
[facility] [address], [place] [address] [phone]  Date:  11/02/2016   Name:  Victoria DecampKaren Jay   DOB:  December 08, 1965   MRN:  [id number]  PCP:  No PCP Per Patient    History of Present Illness:  Victoria Travis is a 51 y.o. female patient who [facility].  7 days of tingling with urination, and odd odor.  No abdominal pain.  No back pain.  No abnormal vaginal discharge.  No nausea.  She has taken echinacea.    Compliant with thyroid medication.  Denies cp, palpitations, diarrhea, constipation, or change in skin.    Patient Active Problem List   Diagnosis Date Noted  . 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 Cellulitis of foot 06/11/2012    Past Medical History:  Diagnosis Date  . Thyroid disease     No past surgical history on file.  Social History  Substance Use Topics  . Smoking status: Former Games developermoker  . Smokeless tobacco: Never Used  . Alcohol use 2.4 oz/week    4 Standard drinks or equivalent per week     Comment: beers nightly    No family history on file.  No Known Allergies  Medication list has been reviewed and updated.  Current Outpatient Prescriptions on File Prior to Visit  Medication Sig Dispense Refill  . levothyroxine (SYNTHROID, LEVOTHROID) 125 MCG tablet TAKE ONE TABLET BY MOUTH ONCE DAILY BEFORE BREAKFAST 30 tablet 0  . HYDROcodone-acetaminophen (NORCO) 7.5-325 MG tablet Take 1 tablet by mouth every 6 (six) hours as needed for severe pain. No refills (Patient not taking: Reported on 07/21/2016) 12 tablet 0   Current Facility-Administered Medications on File Prior to Visit  Medication Dose Route Frequency Provider Last Rate Last Dose  . triamcinolone acetonide (KENALOG-40) injection 40 mg  40 mg Intramuscular Once Carmelina DaneJeffery S Anderson, MD        ROS ROS otherwise unremarkable unless listed above.   Physical Examination: BP (!) 142/102   Pulse 80   Temp 98.2 F (36.8 C) (Oral)   Resp  16   Ht 5\' 6"  (1.676 m)   Wt 166 lb (75.3 kg)   LMP 10/19/2016   SpO2 98%   BMI 26.79 kg/m  Ideal Body Weight: Weight in (lb) to have BMI = 25: 154.6  Physical Exam  Constitutional: She is oriented to person, place, and time. She appears well-developed and well-nourished. No distress.  HENT:  Head: Normocephalic and atraumatic.  Right Ear: External ear normal.  Left Ear: External ear normal.  Eyes: Conjunctivae and EOM are normal. Pupils are equal, round, and reactive to light.  Cardiovascular: Normal rate.   Pulmonary/Chest: Effort normal. No respiratory distress.  Abdominal: Soft. Normal appearance and bowel sounds are normal. There is tenderness in the suprapubic area. There is no CVA tenderness.  Neurological: She is alert and oriented to person, place, and time.  Skin: She is not diaphoretic.  Psychiatric: She has a normal mood and affect. Her behavior is normal.   Results for orders placed or performed in visit on 11/02/16  TSH  Result Value Ref Range   TSH 2.960 0.450 - 4.500 uIU/mL  POCT urinalysis dipstick  Result Value Ref Range   Color, UA yellow yellow   Clarity, UA clear clear   Glucose, UA negative negative   Bilirubin, UA negative negative   Ketones, POC UA small (15) (A) negative   Spec Grav, UA >=1.030    Blood,  UA small (A) negative   pH, UA 5.5    Protein Ur, POC =30 (A) negative   Urobilinogen, UA 1.0    Nitrite, UA Positive (A) Negative   Leukocytes, UA small (1+) (A) Negative  POCT Microscopic Urinalysis ([facility])  Result Value Ref Range   WBC,UR,HPF,POC Moderate (A) None WBC/hpf   RBC,UR,HPF,POC Few (A) None RBC/hpf   Bacteria Many (A) None, Too numerous to count   Mucus Absent Absent   Epithelial Cells, UR Per Microscopy Moderate (A) None, Too numerous to count cells/hpf     Assessment and Plan: Zarriah Starkel is a 51 y.o. female who is here today for cc of thyroid medication refill and dysuria. --declines urine culture.  Given bactrim bid for  5 days. --thyroid medication refilled for 6 months.  Follow up with lab results.  Hypothyroidism, unspecified type - Plan: TSH, levothyroxine (SYNTHROID, LEVOTHROID) 125 MCG tablet  Dysuria - Plan: POCT urinalysis dipstick, POCT Microscopic Urinalysis ([facility]), sulfamethoxazole-trimethoprim (BACTRIM DS,SEPTRA DS) 800-160 MG tablet  Trena Platt, PA-C [facility] 1/28/20186:36 PM

## 2016-11-02 NOTE — Patient Instructions (Addendum)
     IF you received an x-ray today, you will receive an invoice from [facility]. Please contact [facility] at [phone] with questions or concerns regarding your invoice.   IF you received labwork today, you will receive an invoice from [facility]. Please contact [facility] at [phone] with questions or concerns regarding your invoice.   Our billing staff will not be able to assist you with questions regarding bills from these companies.  You will be contacted with the lab results as soon as they are available. The fastest way to get your results is to activate your My Chart account. Instructions are located on the last page of this paperwork. If you have not heard from us regarding the results in 2 weeks, please contact this office.     Urinary Tract Infection, Adult Introduction A urinary tract infection (UTI) is an infection of any part of the urinary tract. The urinary tract includes the:  Kidneys.  Ureters.  Bladder.  Urethra. These organs make, store, and get rid of pee (urine) in the body. Follow these instructions at home:  Take over-the-counter and prescription medicines only as told by your doctor.  If you were prescribed an antibiotic medicine, take it as told by your doctor. Do not stop taking the antibiotic even if you start to feel better.  Avoid the following drinks:  Alcohol.  Caffeine.  Tea.  Carbonated drinks.  Drink enough fluid to keep your pee clear or pale yellow.  Keep all follow-up visits as told by your doctor. This is important.  Make sure to:  Empty your bladder often and completely. Do not to hold pee for long periods of time.  Empty your bladder before and after sex.  Wipe from front to back after a bowel movement if you are female. Use each tissue one time when you wipe. Contact a doctor if:  You have back pain.  You have a fever.  You feel sick to your stomach (nauseous).  You throw up (vomit).  Your  symptoms do not get better after 3 days.  Your symptoms go away and then come back. Get help right away if:  You have very bad back pain.  You have very bad lower belly (abdominal) pain.  You are throwing up and cannot keep down any medicines or water. This information is not intended to replace advice given to you by your health care provider. Make sure you discuss any questions you have with your health care provider. Document Released: 03/13/2008 Document Revised: 03/02/2016 Document Reviewed: 08/16/2015  2017 Elsevier  

## 2016-11-03 LAB — TSH: TSH: 2.96 u[IU]/mL (ref 0.450–4.500)

## 2017-02-08 ENCOUNTER — Encounter (HOSPITAL_COMMUNITY): Payer: Self-pay

## 2017-02-08 ENCOUNTER — Emergency Department (HOSPITAL_COMMUNITY)
Admission: EM | Admit: 2017-02-08 | Discharge: 2017-02-08 | Disposition: A | Discharge status: Home / Self Care | Payer: Self-pay | Attending: Emergency Medicine | Admitting: Emergency Medicine

## 2017-02-08 ENCOUNTER — Encounter: Payer: Self-pay | Admitting: Urgent Care

## 2017-02-08 ENCOUNTER — Emergency Department (HOSPITAL_COMMUNITY): Payer: Self-pay

## 2017-02-08 ENCOUNTER — Ambulatory Visit (INDEPENDENT_AMBULATORY_CARE_PROVIDER_SITE_OTHER): Payer: Self-pay | Admitting: Urgent Care

## 2017-02-08 VITALS — BP 151/115 | HR 100 | Temp 98.9°F | Resp 18 | Ht 65.75 in | Wt 158.0 lb

## 2017-02-08 DIAGNOSIS — R112 Nausea with vomiting, unspecified: Secondary | ICD-10-CM

## 2017-02-08 DIAGNOSIS — R457 State of emotional shock and stress, unspecified: Secondary | ICD-10-CM

## 2017-02-08 DIAGNOSIS — R102 Pelvic and perineal pain: Secondary | ICD-10-CM

## 2017-02-08 DIAGNOSIS — F1010 Alcohol abuse, uncomplicated: Secondary | ICD-10-CM

## 2017-02-08 DIAGNOSIS — E039 Hypothyroidism, unspecified: Secondary | ICD-10-CM | POA: Insufficient documentation

## 2017-02-08 DIAGNOSIS — Z87891 Personal history of nicotine dependence: Secondary | ICD-10-CM | POA: Insufficient documentation

## 2017-02-08 DIAGNOSIS — R1013 Epigastric pain: Secondary | ICD-10-CM

## 2017-02-08 DIAGNOSIS — R1030 Lower abdominal pain, unspecified: Secondary | ICD-10-CM | POA: Insufficient documentation

## 2017-02-08 LAB — COMPREHENSIVE METABOLIC PANEL
ALK PHOS: 67 U/L (ref 38–126)
ALT: 76 U/L — ABNORMAL HIGH (ref 14–54)
ANION GAP: 15 (ref 5–15)
AST: 93 U/L — ABNORMAL HIGH (ref 15–41)
Albumin: 3.9 g/dL (ref 3.5–5.0)
BILIRUBIN TOTAL: 1.4 mg/dL — AB (ref 0.3–1.2)
BUN: 10 mg/dL (ref 6–20)
CALCIUM: 9.3 mg/dL (ref 8.9–10.3)
CO2: 22 mmol/L (ref 22–32)
Chloride: 97 mmol/L — ABNORMAL LOW (ref 101–111)
Creatinine, Ser: 0.68 mg/dL (ref 0.44–1.00)
GFR calc non Af Amer: >60 mL/min (ref >60)
Glucose, Bld: 103 mg/dL — ABNORMAL HIGH (ref 65–99)
Potassium: 3.6 mmol/L (ref 3.5–5.1)
Sodium: 134 mmol/L — ABNORMAL LOW (ref 135–145)
TOTAL PROTEIN: 7.7 g/dL (ref 6.5–8.1)

## 2017-02-08 LAB — POCT CBC
Granulocyte percent: 85.3 %G — AB (ref 37–80)
HCT, POC: 35.1 % — AB (ref 37.7–47.9)
HEMOGLOBIN: 11.5 g/dL — AB (ref 12.2–16.2)
LYMPH, POC: 0.8 (ref 0.6–3.4)
MCH, POC: 26.3 pg — AB (ref 27–31.2)
MCHC: 32.7 g/dL (ref 31.8–35.4)
MCV: 80.3 fL (ref 80–97)
MID (cbc): 0.5 (ref 0–0.9)
MPV: 7.5 fL (ref 0–99.8)
PLATELET COUNT, POC: 452 10*3/uL — AB (ref 142–424)
POC Granulocyte: 7.5 — AB (ref 2–6.9)
POC LYMPH PERCENT: 9.3 %L — AB (ref 10–50)
POC MID %: 5.4 % (ref 0–12)
RBC: 4.37 M/uL (ref 4.04–5.48)
RDW, POC: 21.2 %
WBC: 8.8 10*3/uL (ref 4.6–10.2)

## 2017-02-08 LAB — POC URINE PREG, ED: PREG TEST UR: NEGATIVE

## 2017-02-08 LAB — CBC
HCT: 35.8 % — ABNORMAL LOW (ref 36.0–46.0)
HEMOGLOBIN: 11.2 g/dL — AB (ref 12.0–15.0)
MCH: 25.3 pg — ABNORMAL LOW (ref 26.0–34.0)
MCHC: 31.3 g/dL (ref 30.0–36.0)
MCV: 81 fL (ref 78.0–100.0)
Platelets: 433 10*3/uL — ABNORMAL HIGH (ref 150–400)
RBC: 4.42 MIL/uL (ref 3.87–5.11)
RDW: 18.9 % — ABNORMAL HIGH (ref 11.5–15.5)
WBC: 8.6 10*3/uL (ref 4.0–10.5)

## 2017-02-08 LAB — POCT URINALYSIS DIP (MANUAL ENTRY)
GLUCOSE UA: NEGATIVE mg/dL
Leukocytes, UA: NEGATIVE
Nitrite, UA: NEGATIVE
RBC UA: NEGATIVE
UROBILINOGEN UA: 0.2 U/dL
pH, UA: 6.5 (ref 5.0–8.0)

## 2017-02-08 LAB — WET PREP, GENITAL
Clue Cells Wet Prep HPF POC: NONE SEEN
Sperm: NONE SEEN
TRICH WET PREP: NONE SEEN
YEAST WET PREP: NONE SEEN

## 2017-02-08 LAB — LIPASE, BLOOD: Lipase: 29 U/L (ref 11–51)

## 2017-02-08 LAB — CBG MONITORING, ED: Glucose-Capillary: 107 mg/dL — ABNORMAL HIGH (ref 65–99)

## 2017-02-08 LAB — I-STAT BETA HCG BLOOD, ED (MC, WL, AP ONLY): HCG, QUANTITATIVE: 7.7 m[IU]/mL — AB (ref <5)

## 2017-02-08 MED ORDER — LORAZEPAM 2 MG/ML IJ SOLN
0.0000 mg | Freq: Four times a day (QID) | INTRAMUSCULAR | Status: DC
  Administered 2017-02-08: 1 mg via INTRAVENOUS
  Filled 2017-02-08: qty 1

## 2017-02-08 MED ORDER — ONDANSETRON HCL 4 MG/2ML IJ SOLN
4.0000 mg | Freq: Once | INTRAMUSCULAR | Status: AC
  Administered 2017-02-08: 4 mg via INTRAVENOUS
  Filled 2017-02-08: qty 2

## 2017-02-08 MED ORDER — LORAZEPAM 2 MG/ML IJ SOLN: 0.0000 mg | Freq: Two times a day (BID) | INTRAMUSCULAR | Status: DC

## 2017-02-08 MED ORDER — LORAZEPAM 1 MG PO TABS: 0.0000 mg | ORAL_TABLET | Freq: Four times a day (QID) | ORAL | Status: DC

## 2017-02-08 MED ORDER — TRAMADOL HCL 50 MG PO TABS: 50.0000 mg | ORAL_TABLET | Freq: Three times a day (TID) | ORAL | 0 refills | Status: DC | PRN

## 2017-02-08 MED ORDER — CHLORDIAZEPOXIDE HCL 25 MG PO CAPS: ORAL_CAPSULE | ORAL | 0 refills | Status: DC

## 2017-02-08 MED ORDER — ONDANSETRON 4 MG PO TBDP: 4.0000 mg | ORAL_TABLET | Freq: Once | ORAL | Status: DC | PRN

## 2017-02-08 MED ORDER — LORAZEPAM 1 MG PO TABS: 0.0000 mg | ORAL_TABLET | Freq: Two times a day (BID) | ORAL | Status: DC

## 2017-02-08 MED ORDER — IOPAMIDOL (ISOVUE-300) INJECTION 61%
INTRAVENOUS | Status: AC
  Administered 2017-02-08: 100 mL
  Filled 2017-02-08: qty 100

## 2017-02-08 MED ORDER — ONDANSETRON 4 MG PO TBDP: 4.0000 mg | ORAL_TABLET | Freq: Three times a day (TID) | ORAL | 0 refills | Status: DC | PRN

## 2017-02-08 MED ORDER — SODIUM CHLORIDE 0.9 % IV BOLUS (SEPSIS)
1000.0000 mL | Freq: Once | INTRAVENOUS | Status: AC
  Administered 2017-02-08: 1000 mL via INTRAVENOUS

## 2017-02-08 MED ORDER — MORPHINE SULFATE (PF) 4 MG/ML IV SOLN: 2.0000 mg | Freq: Once | INTRAVENOUS | Status: DC

## 2017-02-08 NOTE — Discharge Instructions (Signed)
Librium can sometimes assist with symptoms of alcohol withdrawal, but sometimes alcohol is necessary to help with more severe signs of withdrawal. Refer to the attached literature for further information.  Follow up with your PCP and OBGYN.

## 2017-02-08 NOTE — Progress Notes (Signed)
MRN: [id number] DOB: December 16, 1965  Subjective:   Victoria Travis is a 51 y.o. female presenting for chief complaint of Abdominal Pain (Vomiting, hotflashes, diarrhea.)  Reports 5 day history of lower belly pain, nausea with vomiting, diarrhea, 3-4 bowel movements, subjective fever. Belly pain has felt like gassiness, menstrual cramping. Has not tried medications for relief. LMP was 01/18/2017. Cycles have been irregular for the past 2 years, occur every 3 weeks, has significant cramping managed with ibuprofen. Takes 800mg  every 4 hours when her cycles come on. Denies chest pain, shob, bloody stools, hematemesis. Drinks 2 glasses of vodka per day. Patient is under a lot of stress with her work, is a Art therapistgeneral manager for a [employer]. Hydrates with ~48 ounces of water.   Clydie BraunKaren has a current medication list which includes the following prescription(s): levothyroxine, and the following Facility-Administered Medications: triamcinolone acetonide. Also has No Known Allergies. Clydie BraunKaren  has a past medical history of Thyroid disease. Also denies past surgical history.   Objective:   Vitals: BP (!) 151/115   Pulse 100   Temp 98.9 F (37.2 C) (Oral)   Resp 18   Ht 5' 5.75" (1.67 m)   Wt 158 lb (71.7 kg)   LMP 01/18/2017 (Approximate)   SpO2 99%   BMI 25.70 kg/m   Physical Exam  Constitutional: She is oriented to person, place, and time. She appears well-developed and well-nourished.  HENT:  Mouth/Throat: Oropharynx is clear and moist.  Eyes: No scleral icterus.  Neck: Normal range of motion. Neck supple.  Cardiovascular: Normal rate, regular rhythm and intact distal pulses.  Exam reveals no gallop and no friction rub.   No murmur heard. Pulmonary/Chest: No respiratory distress. She has no wheezes. She has no rales.  Abdominal: Soft. Bowel sounds are normal. She exhibits no distension and no mass. There is tenderness (epigastric). There is no guarding.  Lymphadenopathy:    She has no cervical  adenopathy.  Neurological: She is alert and oriented to person, place, and time.  Skin: Skin is warm and dry.   Results for orders placed or performed in visit on 02/08/17 (from the past 24 hour(s))  POCT CBC     Status: Abnormal   Collection Time: 02/08/17 11:37 AM  Result Value Ref Range   WBC 8.8 4.6 - 10.2 K/uL   Lymph, poc 0.8 0.6 - 3.4   POC LYMPH PERCENT 9.3 (A) 10 - 50 %L   MID (cbc) 0.5 0 - 0.9   POC MID % 5.4 0 - 12 %M   POC Granulocyte 7.5 (A) 2 - 6.9   Granulocyte percent 85.3 (A) 37 - 80 %G   RBC 4.37 4.04 - 5.48 M/uL   Hemoglobin 11.5 (A) 12.2 - 16.2 g/dL   HCT, POC 56.235.1 (A) 13.037.7 - 47.9 %   MCV 80.3 80 - 97 fL   MCH, POC 26.3 (A) 27 - 31.2 pg   MCHC 32.7 31.8 - 35.4 g/dL   RDW, POC 86.521.2 %   Platelet Count, POC 452 (A) 142 - 424 K/uL   MPV 7.5 0 - 99.8 fL  POCT urinalysis dipstick     Status: Abnormal   Collection Time: 02/08/17 11:45 AM  Result Value Ref Range   Color, UA yellow yellow   Clarity, UA cloudy (A) clear   Glucose, UA negative negative mg/dL   Bilirubin, UA small (A) negative   Ketones, POC UA >= (160) (A) negative mg/dL   Spec Grav, UA >=7.846>=1.030 (A) 1.010 - 1.025  Blood, UA negative negative   pH, UA 6.5 5.0 - 8.0   Protein Ur, POC =30 (A) negative mg/dL   Urobilinogen, UA 0.2 0.2 or 1.0 E.U./dL   Nitrite, UA Negative Negative   Leukocytes, UA Negative Negative   Assessment and Plan :   This case was precepted with Dr. Clelia Croft.   1. Nausea and vomiting, intractability of vomiting not specified, unspecified vomiting type 2. Epigastric pain 3. Alcohol abuse - Patient needs emergent evaluation to r/o pancreatitis, AKI, ulcer due to her alcohol use, ibuprofen use. Patient agreed to go to [facility] ER immediately. Case reported to triage nurse.   4. Emotional stress - Follow up after ER work up.  Wallis Bamberg, PA-C [facility] [phone] 02/08/2017  11:42 AM

## 2017-02-08 NOTE — ED Provider Notes (Signed)
Victoria Travis is a 51 y.o. female, with a history of thyroid disease and alcohol abuse, presenting to the ED with lower abdominal pain for the past 5 days.   HPI from Victoria SafeElizabeth Hammond, PA-C: "Victoria Travis is a 51 y.o. female. She presents today from urgent care for evaluation of abdominal pain.  She reports that over the past 5 days she has been experiencing worsening lower midline abdominal/pelvic pain that is 7/10, cramping in nature.  She says the pain is constant, has not found anything to reduce the pain.  Her pain woke her up this morning, she attempted to "power through it" but could not.  Her pain has been associated with nausea, diarrhea, and headache.  She reports that her LMP  Was a few weeks ago, that she is normally not regular but that she averages a period about every three weeks.  She said that she drinks about three-four "stiff vodkas" a day and has for many years.  She reports that she doesn't eat much, last solid food was approx 24 hours ago when she had three cheese sticks, otherwise her diet is "water and vodka.""  Past Medical History:  Diagnosis Date  . Thyroid disease     Physical Exam  BP 138/88   Pulse 77   Temp 98.3 F (36.8 C) (Oral)   Resp 16   Ht 5\' 6"  (1.676 m)   Wt 71.7 kg   LMP 01/18/2017 (Approximate)   SpO2 98%   BMI 25.50 kg/m   Physical Exam  Constitutional: She appears well-developed and well-nourished. No distress.  HENT:  Head: Normocephalic and atraumatic.  Eyes: Conjunctivae are normal.  Neck: Neck supple.  Cardiovascular: Normal rate, regular rhythm, normal heart sounds and intact distal pulses.   Pulmonary/Chest: Effort normal and breath sounds normal. No respiratory distress.  Abdominal: Soft. There is no tenderness. There is no guarding.  Musculoskeletal: She exhibits no edema.  Lymphadenopathy:    She has no cervical adenopathy.  Neurological: She is alert.  Skin: Skin is warm and dry. She is not diaphoretic.  Psychiatric: She has  a normal mood and affect. Her behavior is normal.  Nursing note and vitals reviewed.   ED Course  Procedures   Results for orders placed or performed during the hospital encounter of 02/08/17  Wet prep, genital  Result Value Ref Range   Yeast Wet Prep HPF POC NONE SEEN NONE SEEN   Trich, Wet Prep NONE SEEN NONE SEEN   Clue Cells Wet Prep HPF POC NONE SEEN NONE SEEN   WBC, Wet Prep HPF POC MODERATE (A) NONE SEEN   Sperm NONE SEEN   Lipase, blood  Result Value Ref Range   Lipase 29 11 - 51 U/L  Comprehensive metabolic panel  Result Value Ref Range   Sodium 134 (L) 135 - 145 mmol/L   Potassium 3.6 3.5 - 5.1 mmol/L   Chloride 97 (L) 101 - 111 mmol/L   CO2 22 22 - 32 mmol/L   Glucose, Bld 103 (H) 65 - 99 mg/dL   BUN 10 6 - 20 mg/dL   Creatinine, Ser 5.620.68 0.44 - 1.00 mg/dL   Calcium 9.3 8.9 - 13.010.3 mg/dL   Total Protein 7.7 6.5 - 8.1 g/dL   Albumin 3.9 3.5 - 5.0 g/dL   AST 93 (H) 15 - 41 U/L   ALT 76 (H) 14 - 54 U/L   Alkaline Phosphatase 67 38 - 126 U/L   Total Bilirubin 1.4 (H) 0.3 - 1.2 mg/dL  GFR calc non Af Amer >60 >60 mL/min   GFR calc Af Amer >60 >60 mL/min   Anion gap 15 5 - 15  CBC  Result Value Ref Range   WBC 8.6 4.0 - 10.5 K/uL   RBC 4.42 3.87 - 5.11 MIL/uL   Hemoglobin 11.2 (L) 12.0 - 15.0 g/dL   HCT 40.9 (L) 81.1 - 91.4 %   MCV 81.0 78.0 - 100.0 fL   MCH 25.3 (L) 26.0 - 34.0 pg   MCHC 31.3 30.0 - 36.0 g/dL   RDW 78.2 (H) 95.6 - 21.3 %   Platelets 433 (H) 150 - 400 K/uL  I-Stat Beta hCG blood, ED ([facility], [facility], [facility] only)  Result Value Ref Range   I-stat hCG, quantitative 7.7 (H) <5 mIU/mL   Comment 3          CBG monitoring, ED  Result Value Ref Range   Glucose-Capillary 107 (H) 65 - 99 mg/dL  POC Urine Pregnancy, ED (do NOT order at [facility])  Result Value Ref Range   Preg Test, Ur NEGATIVE NEGATIVE   US Transvaginal Non-ob  Result Date: 02/08/2017 CLINICAL DATA:  Pelvic pain. EXAM: TRANSABDOMINAL AND TRANSVAGINAL ULTRASOUND OF PELVIS TECHNIQUE: Both  transabdominal and transvaginal ultrasound examinations of the pelvis were performed. Transabdominal technique was performed for global imaging of the pelvis including uterus, ovaries, adnexal regions, and pelvic cul-de-sac. It was necessary to proceed with endovaginal exam following the transabdominal exam to visualize the uterus and ovaries. COMPARISON:  No recent prior. FINDINGS: Uterus Measurements: 7.5 x 4.0 x 4.9 cm. No fibroids or other mass visualized. Endometrium Thickness: 5.1 mm.  No focal abnormality visualized. Right ovary Measurements: 2.8 x 1.6 x 2.4 cm. 1.5 cm simple cyst consistent with physiologic cyst. Left ovary Measurements: 2.8 x 1.6 x 1.3 cm. Limited visualization. Other findings No abnormal free fluid. IMPRESSION: No significant abnormality identified. 1.5 cm simple cyst right ovary consistent physiologic cyst. Electronically Signed   By: Victoria Fus  Register   On: 02/08/2017 14:47   US Pelvis Complete  Result Date: 02/08/2017 CLINICAL DATA:  Pelvic pain. EXAM: TRANSABDOMINAL AND TRANSVAGINAL ULTRASOUND OF PELVIS TECHNIQUE: Both transabdominal and transvaginal ultrasound examinations of the pelvis were performed. Transabdominal technique was performed for global imaging of the pelvis including uterus, ovaries, adnexal regions, and pelvic cul-de-sac. It was necessary to proceed with endovaginal exam following the transabdominal exam to visualize the uterus and ovaries. COMPARISON:  No recent prior. FINDINGS: Uterus Measurements: 7.5 x 4.0 x 4.9 cm. No fibroids or other mass visualized. Endometrium Thickness: 5.1 mm.  No focal abnormality visualized. Right ovary Measurements: 2.8 x 1.6 x 2.4 cm. 1.5 cm simple cyst consistent with physiologic cyst. Left ovary Measurements: 2.8 x 1.6 x 1.3 cm. Limited visualization. Other findings No abnormal free fluid. IMPRESSION: No significant abnormality identified. 1.5 cm simple cyst right ovary consistent physiologic cyst. Electronically Signed   By: Victoria Fus   Register   On: 02/08/2017 14:47   Ct Abdomen Pelvis W Contrast  Result Date: 02/08/2017 CLINICAL DATA:  Lower abdominal pain for 1 week EXAM: CT ABDOMEN AND PELVIS WITH CONTRAST TECHNIQUE: Multidetector CT imaging of the abdomen and pelvis was performed using the standard protocol following bolus administration of intravenous contrast. CONTRAST:  ISOVUE-300 IOPAMIDOL (ISOVUE-300) INJECTION 61% COMPARISON:  Ultrasound 02/08/2017 FINDINGS: Of note, only the upper abdomen was scanned during the portal venous phase ; according to technologist, the scan was stopped in error and subsequent images of the lower abdomen and pelvis were obtained  in the delayed phase. Lower chest: No acute consolidation or pleural effusion. Normal heart size Hepatobiliary: Hepatic steatosis. No biliary dilatation or calcified stones Pancreas: Unremarkable. No pancreatic ductal dilatation or surrounding inflammatory changes. Spleen: Normal in size without focal abnormality. Adrenals/Urinary Tract: Adrenal glands within normal limits. No definite abnormalities within the kidneys on delayed images. The bladder contains a small amount of contrast material. Stomach/Bowel: Stomach is within normal limits. Appendix appears normal. No evidence of bowel wall thickening, distention, or inflammatory changes. Sigmoid colon diverticula without acute wall thickening Vascular/Lymphatic: No significant vascular findings are present. No enlarged abdominal or pelvic lymph nodes. Reproductive: Uterus and bilateral adnexa are unremarkable. Other: No abdominal wall hernia or abnormality. No abdominopelvic ascites. Musculoskeletal: No acute or significant osseous findings. IMPRESSION: 1. Limited study due to technical air, the majority of the kidneys, lower abdomen and pelvis were imaged in delayed phase only. Allowing for this, no acute intra-abdominal or pelvic pathology is seen. 2. Hepatic steatosis Electronically Signed   By: Jasmine Pang M.D.   On:  02/08/2017 19:38    MDM  Clinical Course as of Feb 09 129  Thu Feb 08, 2017  1630 Upon my initial evaluation, patient rates her pain at 5 out of 10 and states improvement in her nausea as well.  [SJ]  1800 Patient denies any current pain. Abdomen continues to be soft and nontender.  [SJ]    Clinical Course User Index [SJ] Anselm Pancoast, PA-C    Received patient care handoff report from Victoria Safe, PA-C. Plan: Urine pregnancy pending. CT abdomen and pelvis. Librium taper at discharge.  Patient's pain and nausea improved. Patient is nontoxic appearing, afebrile, not tachycardic, not tachypneic, not hypotensive, maintains excellent SPO2 on room air, and is in no apparent distress. Patient has no signs of sepsis or other serious or life-threatening condition. She did not require further intervention for comfort. No acute abnormality on imaging. PCP and OB/GYN follow-up. Home care and return precautions discussed. Outpatient resources for alcohol dependence and recovery were presented to the patient and any questions were answered. Patient voiced understanding of all instructions and is comfortable discharge.    Vitals:   02/08/17 1454 02/08/17 1500 02/08/17 1515 02/08/17 1517  BP: (!) 147/95 (!) 142/92 138/88 138/88  Pulse: 88 91 88 77  Resp:    16  Temp:      TempSrc:      SpO2: 100% 95% 99% 98%  Weight:      Height:       Vitals:   02/08/17 1800 02/08/17 1849 02/08/17 1900 02/08/17 1945  BP: (!) 141/95 (!) 141/93 (!) 148/98   Pulse: 79 77 78 76  Resp:  18    Temp:      TempSrc:      SpO2: 100% 100% 99% 99%  Weight:      Height:          Anselm Pancoast, PA-C 02/09/17 0133    Melene Plan, DO 02/10/17 1947

## 2017-02-08 NOTE — ED Notes (Signed)
Please notify pt's husband with any changes.  Victoria Travis [phone]

## 2017-02-08 NOTE — ED Triage Notes (Signed)
Pt was sent from Urgent Care with c/o abdominal pain. She states she woke up this morning with vomiting, nausea, and diarrhea. [facility] sent pt to the ED for evaluation of pancreatitis and to have her kidney functions checked.

## 2017-02-08 NOTE — ED Provider Notes (Signed)
[facility]ENCY DEPT Provider Note   CSN: [id number] Arrival date & time: 02/08/17  1229     History   Chief Complaint Chief Complaint  Patient presents with  . Abdominal Pain    HPI Victoria DecampKaren Baranek is a 51 y.o. female. Victoria Travis presents today from urgent care for evaluation of abdominal pain.  Victoria Travis reports that over the past 5 days Victoria Travis has been experiencing worsening lower midline abdominal/pelvic pain that is 7/10, cramping in nature.  Victoria Travis says the pain is constant, has not found anything to reduce the pain.  Her pain woke her up this morning, Victoria Travis attempted to "power through it" but could not.  Her pain has been associated with nausea, diarrhea, and headache.  Victoria Travis reports that her LMP  Was a few weeks ago, that Victoria Travis is normally not regular but that Victoria Travis averages a period about every three weeks.  Victoria Travis said that Victoria Travis drinks about three-four "stiff vodkas" a day and has for many years.  Victoria Travis reports that Victoria Travis doesn't eat much, last solid food was approx 24 hours ago when Victoria Travis had three cheese sticks, otherwise her diet is "water and vodka."    HPI  Past Medical History:  Diagnosis Date  . Thyroid disease     Patient Active Problem List   Diagnosis Date Noted  . 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 Cellulitis of foot 06/11/2012    History reviewed. No pertinent surgical history.  OB History    No data available       Home Medications    Prior to Admission medications   Medication Sig Start Date End Date Taking? Authorizing Provider  ibuprofen (ADVIL,MOTRIN) 200 MG tablet Take 200 mg by mouth every 6 (six) hours as needed for moderate pain.   Yes Historical Provider, MD  levothyroxine (SYNTHROID, LEVOTHROID) 125 MCG tablet Take 1 tablet (125 mcg total) by mouth daily before breakfast. 11/02/16  Yes Collie SiadStephanie D English, PA  Multiple Vitamin (MULTIVITAMIN) tablet Take 1 tablet by mouth daily.   Yes Historical Provider, MD  traMADol (ULTRAM) 50 MG tablet Take 1 tablet (50 mg total) by  mouth every 8 (eight) hours as needed. 02/08/17   Wallis BambergMario Mani, PA-C    Family History No family history on file.  Social History Social History  Substance Use Topics  . Smoking status: Former Games developermoker  . Smokeless tobacco: Never Used  . Alcohol use 2.4 oz/week    4 Standard drinks or equivalent per week     Comment: beers nightly     Allergies   Patient has no known allergies.   Review of Systems Review of Systems  Constitutional: Negative for chills and fever.  HENT: Negative for ear pain and sore throat.   Eyes: Negative for pain and visual disturbance.  Respiratory: Negative for cough and shortness of breath.   Cardiovascular: Negative for chest pain and palpitations.  Gastrointestinal: Positive for abdominal pain, diarrhea and nausea. Negative for vomiting.  Genitourinary: Positive for pelvic pain. Negative for decreased urine volume, difficulty urinating, dyspareunia, dysuria, flank pain, frequency, hematuria, menstrual problem, urgency, vaginal bleeding, vaginal discharge and vaginal pain.  Musculoskeletal: Negative for arthralgias, back pain and neck stiffness.  Skin: Negative for color change and rash.  Neurological: Positive for headaches. Negative for seizures and syncope.  All other systems reviewed and are negative.    Physical Exam Updated Vital Signs BP 138/88   Pulse 77   Temp 98.3 F (36.8 C) (Oral)   Resp 16   Ht 5\' 6"  (  1.676 m)   Wt 71.7 kg   LMP 01/18/2017 (Approximate)   SpO2 98%   BMI 25.50 kg/m   Physical Exam  Constitutional: Victoria Travis appears well-developed and well-nourished. No distress.  HENT:  Head: Normocephalic and atraumatic.  Eyes: Conjunctivae are normal. No scleral icterus.  Neck: Normal range of motion.  Cardiovascular: Normal rate, regular rhythm and normal heart sounds.   No murmur heard. Pulmonary/Chest: Effort normal and breath sounds normal. No stridor. No respiratory distress.  Abdominal: Soft. Bowel sounds are normal. Victoria Travis  exhibits no distension, no pulsatile liver, no fluid wave, no abdominal bruit and no ascites. There is no hepatosplenomegaly. There is tenderness in the suprapubic area. There is no guarding and no tenderness at McBurney's point. No hernia.  Genitourinary: Vagina normal and uterus normal. Pelvic exam was performed with patient prone. There is no rash or lesion on the right labia. There is no rash or lesion on the left labia. Uterus is not enlarged and not tender. Cervix exhibits no motion tenderness, no discharge and no friability. Right adnexum displays no mass, no tenderness and no fullness. Left adnexum displays no mass, no tenderness and no fullness. No tenderness or bleeding in the vagina. No foreign body in the vagina. No vaginal discharge found.  Genitourinary Comments: Pelvic exam performed in presence of female chaperone.   Musculoskeletal: Victoria Travis exhibits no edema or deformity.  Neurological: Victoria Travis is alert. No sensory deficit. Victoria Travis exhibits normal muscle tone.  Skin: Skin is warm and dry. Victoria Travis is not diaphoretic.  Psychiatric: Victoria Travis has a normal mood and affect. Her behavior is normal.  Nursing note and vitals reviewed.    ED Treatments / Results  Labs (all labs ordered are listed, but only abnormal results are displayed) Labs Reviewed  WET PREP, GENITAL - Abnormal; Notable for the following:       Result Value   WBC, Wet Prep HPF POC MODERATE (*)    All other components within normal limits  COMPREHENSIVE METABOLIC PANEL - Abnormal; Notable for the following:    Sodium 134 (*)    Chloride 97 (*)    Glucose, Bld 103 (*)    AST 93 (*)    ALT 76 (*)    Total Bilirubin 1.4 (*)    All other components within normal limits  CBC - Abnormal; Notable for the following:    Hemoglobin 11.2 (*)    HCT 35.8 (*)    MCH 25.3 (*)    RDW 18.9 (*)    Platelets 433 (*)    All other components within normal limits  I-STAT BETA HCG BLOOD, ED ([facility], [facility], [facility] ONLY) - Abnormal; Notable for the following:      I-stat hCG, quantitative 7.7 (*)    All other components within normal limits  CBG MONITORING, ED - Abnormal; Notable for the following:    Glucose-Capillary 107 (*)    All other components within normal limits  LIPASE, BLOOD  POC URINE PREG, ED  GC/CHLAMYDIA PROBE AMP ([place]) NOT AT [facility]   Urinalysis in system from urgent care this morning, no need to repeat in ED.    EKG  EKG Interpretation None       Radiology US Transvaginal Non-ob  Result Date: 02/08/2017 CLINICAL DATA:  Pelvic pain. EXAM: TRANSABDOMINAL AND TRANSVAGINAL ULTRASOUND OF PELVIS TECHNIQUE: Both transabdominal and transvaginal ultrasound examinations of the pelvis were performed. Transabdominal technique was performed for global imaging of the pelvis including uterus, ovaries, adnexal regions, and pelvic cul-de-sac. It  was necessary to proceed with endovaginal exam following the transabdominal exam to visualize the uterus and ovaries. COMPARISON:  No recent prior. FINDINGS: Uterus Measurements: 7.5 x 4.0 x 4.9 cm. No fibroids or other mass visualized. Endometrium Thickness: 5.1 mm.  No focal abnormality visualized. Right ovary Measurements: 2.8 x 1.6 x 2.4 cm. 1.5 cm simple cyst consistent with physiologic cyst. Left ovary Measurements: 2.8 x 1.6 x 1.3 cm. Limited visualization. Other findings No abnormal free fluid. IMPRESSION: No significant abnormality identified. 1.5 cm simple cyst right ovary consistent physiologic cyst. Electronically Signed   By: Maisie Fus  Register   On: 02/08/2017 14:47   US Pelvis Complete  Result Date: 02/08/2017 CLINICAL DATA:  Pelvic pain. EXAM: TRANSABDOMINAL AND TRANSVAGINAL ULTRASOUND OF PELVIS TECHNIQUE: Both transabdominal and transvaginal ultrasound examinations of the pelvis were performed. Transabdominal technique was performed for global imaging of the pelvis including uterus, ovaries, adnexal regions, and pelvic cul-de-sac. It was necessary to proceed with endovaginal exam  following the transabdominal exam to visualize the uterus and ovaries. COMPARISON:  No recent prior. FINDINGS: Uterus Measurements: 7.5 x 4.0 x 4.9 cm. No fibroids or other mass visualized. Endometrium Thickness: 5.1 mm.  No focal abnormality visualized. Right ovary Measurements: 2.8 x 1.6 x 2.4 cm. 1.5 cm simple cyst consistent with physiologic cyst. Left ovary Measurements: 2.8 x 1.6 x 1.3 cm. Limited visualization. Other findings No abnormal free fluid. IMPRESSION: No significant abnormality identified. 1.5 cm simple cyst right ovary consistent physiologic cyst. Electronically Signed   By: Maisie Fus  Register   On: 02/08/2017 14:47    Procedures Procedures (including critical care time)  Medications Ordered in ED Medications  LORazepam (ATIVAN) injection 0-4 mg (1 mg Intravenous Given 02/08/17 1353)    Followed by  LORazepam (ATIVAN) injection 0-4 mg (not administered)  LORazepam (ATIVAN) tablet 0-4 mg (0 mg Oral Not Given 02/08/17 1400)    Followed by  LORazepam (ATIVAN) tablet 0-4 mg (not administered)  sodium chloride 0.9 % bolus 1,000 mL (0 mLs Intravenous Stopped 02/08/17 1500)  ondansetron (ZOFRAN) injection 4 mg (4 mg Intravenous Given 02/08/17 1353)     Initial Impression / Assessment and Plan / ED Course  I have reviewed the triage vital signs and the nursing notes.  Pertinent labs & imaging results that were available during my care of the patient were reviewed by me and considered in my medical decision making (see chart for details).  Clinical Course as of Feb 09 1639  Thu Feb 08, 2017  1630 Upon my initial evaluation, patient rates her pain at 5 out of 10 and states improvement in her nausea as well.  [SJ]    Clinical Course User Index [SJ] Anselm Pancoast, PA-C    While in the ED patient was informed that her POC blood pregnancy test was not negative but also not a strong positive.  Prior to administration of medications it was discussed that if Victoria Travis is pregnant that the medications  may cause harm to the fetus including, but not limited to severe disability and death. Patient stated that Victoria Travis understood and wished to receive the medications for symptom control.  Final Clinical Impressions(s) / ED Diagnoses   Final diagnoses:  Pelvic pain    Anicka Stuckert presents with approximately 5 days of lower midline abdominal pain/pelvic pain.  Victoria Travis was seen at urgent care this morning and referred to ED based on presence of ketones in her urine.  Patient is nontoxic, nonseptic appearing, in no apparent distress.  Labs, imaging  and vitals reviewed. At shift change care was transferred to [facility], who will follow pending studies, re-evaulate and determine disposition.    Patient has a significant history of alcohol use and while in ED was showing symptoms consistent with alcohol withdrawal.  Patient was given ativan for symptom control. It was discussed with the patient that her liver enzymes were elevated and that may be from drinking.  Patient was given option of a librium taper and Victoria Travis accepted, Victoria Travis will also be provided with information for outpatient counseling and drug/alcohol use.      New Prescriptions New Prescriptions   No medications on file     Cristina Gong, PA-C 02/08/17 [address] [place], PA-C 02/08/17 Merrily Brittle    Donnetta Hutching, MD 02/10/17 1012

## 2017-02-08 NOTE — ED Notes (Signed)
ED Provider at bedside. 

## 2017-02-08 NOTE — ED Notes (Signed)
Pt reports drinking "at least 2 stiff alcoholic drinks" a day.

## 2017-02-08 NOTE — Patient Instructions (Signed)
[facility] to make sure you get worked up for pancreatitis, kidney function. You will likely need imaging for your belly and definitely IV fluids. Please report there immediately. Stop using ibuprofen, NSAIDs for now.

## 2017-02-09 LAB — COMPREHENSIVE METABOLIC PANEL
A/G RATIO: 1.5 (ref 1.2–2.2)
ALBUMIN: 4.5 g/dL (ref 3.5–5.5)
ALK PHOS: 72 IU/L (ref 39–117)
ALT: 79 IU/L — ABNORMAL HIGH (ref 0–32)
AST: 100 IU/L — ABNORMAL HIGH (ref 0–40)
BILIRUBIN TOTAL: 0.8 mg/dL (ref 0.0–1.2)
BUN / CREAT RATIO: 21 (ref 9–23)
BUN: 12 mg/dL (ref 6–24)
CHLORIDE: 94 mmol/L — AB (ref 96–106)
CO2: 24 mmol/L (ref 18–29)
Calcium: 9.8 mg/dL (ref 8.7–10.2)
Creatinine, Ser: 0.58 mg/dL (ref 0.57–1.00)
GFR calc Af Amer: 125 mL/min/{1.73_m2} (ref >59)
GFR calc non Af Amer: 109 mL/min/{1.73_m2} (ref >59)
GLUCOSE: 101 mg/dL — AB (ref 65–99)
Globulin, Total: 3.1 g/dL (ref 1.5–4.5)
POTASSIUM: 4.9 mmol/L (ref 3.5–5.2)
SODIUM: 139 mmol/L (ref 134–144)
Total Protein: 7.6 g/dL (ref 6.0–8.5)

## 2017-02-09 LAB — GC/CHLAMYDIA PROBE AMP (~~LOC~~) NOT AT ARMC
Chlamydia: NEGATIVE
NEISSERIA GONORRHEA: NEGATIVE

## 2017-02-13 ENCOUNTER — Encounter: Payer: Self-pay | Admitting: Physician Assistant

## 2017-02-13 ENCOUNTER — Ambulatory Visit (INDEPENDENT_AMBULATORY_CARE_PROVIDER_SITE_OTHER): Payer: Self-pay | Admitting: Physician Assistant

## 2017-02-13 VITALS — BP 124/85 | HR 84 | Temp 98.2°F | Resp 16 | Ht 66.0 in | Wt 161.8 lb

## 2017-02-13 DIAGNOSIS — R109 Unspecified abdominal pain: Secondary | ICD-10-CM

## 2017-02-13 DIAGNOSIS — F1010 Alcohol abuse, uncomplicated: Secondary | ICD-10-CM

## 2017-02-13 DIAGNOSIS — R102 Pelvic and perineal pain: Secondary | ICD-10-CM

## 2017-02-13 DIAGNOSIS — R457 State of emotional shock and stress, unspecified: Secondary | ICD-10-CM

## 2017-02-13 LAB — POCT URINALYSIS DIP (MANUAL ENTRY)
Bilirubin, UA: NEGATIVE
Blood, UA: NEGATIVE
GLUCOSE UA: NEGATIVE mg/dL
Ketones, POC UA: NEGATIVE mg/dL
Leukocytes, UA: NEGATIVE
Nitrite, UA: NEGATIVE
PROTEIN UA: NEGATIVE mg/dL
SPEC GRAV UA: 1.015 (ref 1.010–1.025)
UROBILINOGEN UA: 0.2 U/dL
pH, UA: 7 (ref 5.0–8.0)

## 2017-02-13 NOTE — Patient Instructions (Addendum)
I am recommending you use the tramadol for your lower abdominal cramping. I would like you to hydrate well with water.   I would also like you to take a baby aspirin.  Continue with your alcohol abstaining.  You can take 50mg  and allow 10 hours before you have to get to work.      IF you received an x-ray today, you will receive an invoice from [facility]. Please contact [facility] at [phone] with questions or concerns regarding your invoice.   IF you received labwork today, you will receive an invoice from [facility]. Please contact [facility] at [phone] with questions or concerns regarding your invoice.   Our billing staff will not be able to assist you with questions regarding bills from these companies.  You will be contacted with the lab results as soon as they are available. The fastest way to get your results is to activate your My Chart account. Instructions are located on the last page of this paperwork. If you have not heard from us regarding the results in 2 weeks, please contact this office.

## 2017-02-13 NOTE — Progress Notes (Signed)
PRIMARY CARE AT [facility] [address] [place] [address] [phone]  Date:  02/13/2017   Name:  Victoria Travis   DOB:  06/19/1966   MRN:  [id number]  PCP:  Garnetta Buddy, PA    History of Present Illness:  Victoria Travis is a 51 y.o. female patient who presents to PCP with  Chief Complaint  Patient presents with  . Follow-up    ABDOMINAL PAIN     Patient was seen in the ED 5 days ago with concern of progressing abdominal/pelvic cramping pain.  Associated nausea, diarrhea, and headache.  States there was increased etOH use at the time.  Referred after ketones found in urine at primary care office.  GC/Chl were negative.  Lipase negative, cmp unremarkable.  Lower abdominal and pelvic pain diagnosed. --3-4/10 CONTINUES at the lower pelvic.  Feels intermittent sharp pains, and cramping that will subside.  She has noticed any associated symptoms with her pain.  She will be standing or sitting.  She has stopped drinking for the last 3 days.  Denies withdrawal symptoms.  Has stopped the librium. Pain is relieved with laying down.   She reports early satiety, but no urinary frequency, bloating, or weight fluctuation.  Labs per formed.   Patient's last menstrual period was 01/18/2017 (approximate).   Wt Readings from Last 3 Encounters:  02/13/17 161 lb 12.8 oz (73.4 kg)  02/08/17 158 lb (71.7 kg)  02/08/17 158 lb (71.7 kg)    Patient Active Problem List   Diagnosis Date Noted  . 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 Cellulitis of foot 06/11/2012    Past Medical History:  Diagnosis Date  . Thyroid disease     History reviewed. No pertinent surgical history.  Social History  Substance Use Topics  . Smoking status: Former Games developer  . Smokeless tobacco: Never Used  . Alcohol use 2.4 oz/week    4 Standard drinks or equivalent per week     Comment: beers nightly    History reviewed. No pertinent family history.  No Known Allergies  Medication list has been  reviewed and updated.  Current Outpatient Prescriptions on File Prior to Visit  Medication Sig Dispense Refill  . levothyroxine (SYNTHROID, LEVOTHROID) 125 MCG tablet Take 1 tablet (125 mcg total) by mouth daily before breakfast. 30 tablet 5  . Multiple Vitamin (MULTIVITAMIN) tablet Take 1 tablet by mouth daily.    Marland Kitchen ibuprofen (ADVIL,MOTRIN) 200 MG tablet Take 200 mg by mouth every 6 (six) hours as needed for moderate pain.    . traMADol (ULTRAM) 50 MG tablet Take 1 tablet (50 mg total) by mouth every 8 (eight) hours as needed. (Patient not taking: Reported on 02/13/2017) 30 tablet 0   Current Facility-Administered Medications on File Prior to Visit  Medication Dose Route Frequency Provider Last Rate Last Dose  . triamcinolone acetonide (KENALOG-40) injection 40 mg  40 mg Intramuscular Once Carmelina Dane, MD        ROS ROS otherwise unremarkable unless listed above.  Physical Examination: BP 124/85 (BP Location: Right Arm, Patient Position: Sitting, Cuff Size: Normal)   Pulse 84   Temp 98.2 F (36.8 C) (Oral)   Resp 16   Ht 5\' 6"  (1.676 m)   Wt 161 lb 12.8 oz (73.4 kg)   LMP 01/18/2017 (Approximate)   SpO2 98%   BMI 26.12 kg/m  Ideal Body Weight: Weight in (lb) to have BMI = 25: 154.6  Physical Exam  Constitutional: She is oriented to person, place,  and time. She appears well-developed and well-nourished. No distress.  HENT:  Head: Normocephalic and atraumatic.  Right Ear: External ear normal.  Left Ear: External ear normal.  Eyes: Conjunctivae and EOM are normal. Pupils are equal, round, and reactive to light.  Cardiovascular: Normal rate.   Pulmonary/Chest: Effort normal. No respiratory distress.  Abdominal: Soft. Normal appearance and bowel sounds are normal. There is no hepatosplenomegaly. There is tenderness in the suprapubic area and left lower quadrant. There is no CVA tenderness.  Genitourinary: Vagina normal. Pelvic exam was performed with patient supine. Right  adnexum displays no mass and no tenderness. Left adnexum displays no mass and no tenderness.  Neurological: She is alert and oriented to person, place, and time.  Skin: She is not diaphoretic.  Psychiatric: She has a normal mood and affect. Her behavior is normal.     Results for orders placed or performed in visit on 02/13/17  POCT urinalysis dipstick  Result Value Ref Range   Color, UA yellow yellow   Clarity, UA clear clear   Glucose, UA negative negative mg/dL   Bilirubin, UA negative negative   Ketones, POC UA negative negative mg/dL   Spec Grav, UA 1.6101.015 9.6041.010 - 1.025   Blood, UA negative negative   pH, UA 7.0 5.0 - 8.0   Protein Ur, POC negative negative mg/dL   Urobilinogen, UA 0.2 0.2 or 1.0 E.U./dL   Nitrite, UA Negative Negative   Leukocytes, UA Negative Negative   CT-scan of abdomen and pelvis  Adrenals/Urinary Tract: Adrenal glands within normal limits. No definite abnormalities within the kidneys on delayed images. The bladder contains a small amount of contrast material.  Stomach/Bowel: Stomach is within normal limits. Appendix appears normal. No evidence of bowel wall thickening, distention, or inflammatory changes. Sigmoid colon diverticula without acute wall thickening  Vascular/Lymphatic: No significant vascular findings are present. No enlarged abdominal or pelvic lymph nodes.  Reproductive: Uterus and bilateral adnexa are unremarkable.  Other: No abdominal wall hernia or abnormality. No abdominopelvic ascites.  Musculoskeletal: No acute or significant osseous findings. IMPRESSION: 1. Limited study due to technical air, the majority of the kidneys, lower abdomen and pelvis were imaged in delayed phase only. Allowing for this, no acute intra-abdominal or pelvic pathology is seen. 2. Hepatic steatosis  Assessment and Plan: Victoria Travis is a 51 y.o. female who is here today for follow up of abdominal pain. --referral to gastroenterology  placed at this time.  She continues to have symptoms and is requesting this.  Gynecological exam not telling.   Pelvic pain - Plan: POCT urinalysis dipstick, Ambulatory referral to Gastroenterology  Emotional stress  Alcohol abuse  Abdominal pain, unspecified abdominal location - Plan: POCT urinalysis dipstick, Ambulatory referral to Gastroenterology  Victoria PlattStephanie Lovinia Snare, PA-C [facility] 5/11/20188:03 AM

## 2017-03-26 ENCOUNTER — Encounter: Payer: Self-pay | Admitting: Physician Assistant

## 2017-07-27 ENCOUNTER — Other Ambulatory Visit: Payer: Self-pay | Admitting: Physician Assistant

## 2017-07-27 DIAGNOSIS — E039 Hypothyroidism, unspecified: Secondary | ICD-10-CM

## 2017-07-30 NOTE — Telephone Encounter (Signed)
Pt calling about med refill on thyroid

## 2017-09-04 ENCOUNTER — Ambulatory Visit (INDEPENDENT_AMBULATORY_CARE_PROVIDER_SITE_OTHER): Payer: Self-pay | Admitting: Physician Assistant

## 2017-09-04 ENCOUNTER — Encounter (HOSPITAL_COMMUNITY): Payer: Self-pay | Admitting: Emergency Medicine

## 2017-09-04 ENCOUNTER — Other Ambulatory Visit: Payer: Self-pay

## 2017-09-04 ENCOUNTER — Encounter: Payer: Self-pay | Admitting: Physician Assistant

## 2017-09-04 VITALS — BP 138/86 | HR 112 | Temp 98.9°F | Resp 18 | Ht 66.0 in | Wt 154.2 lb

## 2017-09-04 DIAGNOSIS — E890 Postprocedural hypothyroidism: Secondary | ICD-10-CM | POA: Insufficient documentation

## 2017-09-04 DIAGNOSIS — Z79899 Other long term (current) drug therapy: Secondary | ICD-10-CM | POA: Insufficient documentation

## 2017-09-04 DIAGNOSIS — R112 Nausea with vomiting, unspecified: Secondary | ICD-10-CM | POA: Insufficient documentation

## 2017-09-04 DIAGNOSIS — S0990XA Unspecified injury of head, initial encounter: Secondary | ICD-10-CM

## 2017-09-04 DIAGNOSIS — W01198A Fall on same level from slipping, tripping and stumbling with subsequent striking against other object, initial encounter: Secondary | ICD-10-CM | POA: Insufficient documentation

## 2017-09-04 DIAGNOSIS — Y998 Other external cause status: Secondary | ICD-10-CM | POA: Insufficient documentation

## 2017-09-04 DIAGNOSIS — Y929 Unspecified place or not applicable: Secondary | ICD-10-CM | POA: Insufficient documentation

## 2017-09-04 DIAGNOSIS — F17210 Nicotine dependence, cigarettes, uncomplicated: Secondary | ICD-10-CM | POA: Insufficient documentation

## 2017-09-04 DIAGNOSIS — R51 Headache: Secondary | ICD-10-CM | POA: Insufficient documentation

## 2017-09-04 DIAGNOSIS — E039 Hypothyroidism, unspecified: Secondary | ICD-10-CM

## 2017-09-04 DIAGNOSIS — S060X0A Concussion without loss of consciousness, initial encounter: Secondary | ICD-10-CM | POA: Insufficient documentation

## 2017-09-04 DIAGNOSIS — R410 Disorientation, unspecified: Secondary | ICD-10-CM

## 2017-09-04 DIAGNOSIS — Y9389 Activity, other specified: Secondary | ICD-10-CM | POA: Insufficient documentation

## 2017-09-04 NOTE — Progress Notes (Signed)
Victoria DecampKaren Spraggins  MRN: [id number] DOB: 19-Dec-1965  Subjective:  Victoria Travis is a [age] y.o. female seen in office today for a chief complaint of head injury x ~5 days ago.  She cannot remember if it was on Friday but thinks it was.  She was walking her [place] and it tripped her. She fell and hit her head on something (she cannot remember) and gashed her head open. Notes there was blood everywhere. Her friend patched it up. She thinks she lost consciousness. Since the injury, she has had nausea,excesive fatigue, incoherent speech, photophobia, and dizziness. Denies vomiting, headache, tinnitus, and visual disturbance. She notes she just feels really off since her head injury. She is also concerned that her thyroid levels are off. Has hx of hypothyroidism. Dx made in 2008. Currently controlled on synthroid 125mcg. Has been on this dose a while. Last TSH was in 10/2016 and it was normal. For the past few months, she has been having sleep disturbance, decreased appetite, constipation, diarrhea, heart palpitations. Denies hair thinning, skin changes, SOB, and chest pain.   Review of Systems  Constitutional: Negative for chills and fever.  HENT: Negative for congestion and sinus pressure.   Cardiovascular: Negative for chest pain and leg swelling.  Gastrointestinal: Negative for abdominal pain.  Musculoskeletal: Negative for myalgias.    Patient Active Problem List   Diagnosis Date Noted  . 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 Cellulitis of foot 06/11/2012    Current Outpatient Medications on File Prior to Visit  Medication Sig Dispense Refill  . ibuprofen (ADVIL,MOTRIN) 200 MG tablet Take 200 mg by mouth every 6 (six) hours as needed for moderate pain.    Marland Kitchen. levothyroxine (SYNTHROID, LEVOTHROID) 125 MCG tablet TAKE ONE TABLET BY MOUTH ONCE DAILY BEFORE BREAKFAST 30 tablet 0  . Multiple Vitamin (MULTIVITAMIN) tablet Take 1 tablet by mouth daily.    . traMADol (ULTRAM) 50 MG tablet Take  1 tablet (50 mg total) by mouth every 8 (eight) hours as needed. (Patient not taking: Reported on 02/13/2017) 30 tablet 0   Current Facility-Administered Medications on File Prior to Visit  Medication Dose Route Frequency Provider Last Rate Last Dose  . triamcinolone acetonide (KENALOG-40) injection 40 mg  40 mg Intramuscular Once Carmelina DaneAnderson, Jeffery S, MD        No Known Allergies   Objective:  BP 138/86   Pulse (!) 112   Temp 98.9 F (37.2 C) (Oral)   Resp 18   Ht 5\' 6"  (1.676 m)   Wt 154 lb 3.2 oz (69.9 kg)   LMP 08/13/2017   SpO2 98%   BMI 24.89 kg/m   Physical Exam  Constitutional: She has a sickly appearance (actively vomiting during exam).  HENT:  Head: Normocephalic and atraumatic. Head is without Battle's sign.  Right Ear: Tympanic membrane, external ear and ear canal normal.  Left Ear: Tympanic membrane, external ear and ear canal normal.  Bilateral cheeks appear flushed and swollen.   Eyes: Conjunctivae and EOM are normal. Pupils are equal, round, and reactive to light.  Squinting occasionally during history taking.  Neck: Normal range of motion.  Cardiovascular: Regular rhythm and normal heart sounds. Tachycardia present.  Pulmonary/Chest: Effort normal.  Neurological: She is alert. She has normal strength and intact cranial nerves. She is disoriented.  Unsteady gait when ambulating to stretcher.   Skin: Skin is warm. Laceration (well healing laceration noted above left eyebrow, surroundying ecchymosis noted, no surrounding tenderness with palpation) noted. She is diaphoretic.  Psychiatric:  Affect normal. She exhibits abnormal recent memory.  Vitals reviewed.  Patient given cool with washcloth to place on neck while vomiting.  Assessment and Plan :  This case was precepted with Dr. Leretha PolSantiago.  1. Hypothyroidism, unspecified type 2. Injury of head, initial encounter 3. Confusion 4. Nausea and vomiting, intractability of vomiting not specified, unspecified  vomiting type Patient appears acutely ill.  There is some mild confusion noted, especially in relation to specific details of head injury.  Due to history of head injury with loss of consciousness, symptoms, physical exam findings, and active vomiting, patient warrants further emergent evaluation by the ED.  Patient is accompanied by her mother-in-law, who is not from here and unfamiliar with the area.  EMS contacted and patient was transported to ED.  Benjiman CoreBrittany Glynnis Gavel PA-C  [facility] 09/04/2017 4:16 PM

## 2017-09-04 NOTE — ED Triage Notes (Signed)
Per EMS, pt from Urgent Care, fell on Friday, since the fall pt has been more lethargic, had difficulty walking, mild confusion, and nausea/vomiting. Pt A&O x 4 at this time. Pt also concerned her thyroid levels may be off.

## 2017-09-04 NOTE — Patient Instructions (Signed)
     IF you received an x-ray today, you will receive an invoice from [facility]. Please contact [facility] at [phone] with questions or concerns regarding your invoice.   IF you received labwork today, you will receive an invoice from [facility]. Please contact [facility] at [phone] with questions or concerns regarding your invoice.   Our billing staff will not be able to assist you with questions regarding bills from these companies.  You will be contacted with the lab results as soon as they are available. The fastest way to get your results is to activate your My Chart account. Instructions are located on the last page of this paperwork. If you have not heard from us regarding the results in 2 weeks, please contact this office.     

## 2017-09-04 NOTE — ED Notes (Signed)
Dr. Erma HeritageIsaacs made aware of pt's symptoms of confusion since fall. No CT recommended at this time.

## 2017-09-05 ENCOUNTER — Other Ambulatory Visit: Payer: Self-pay | Admitting: Physician Assistant

## 2017-09-05 ENCOUNTER — Telehealth: Payer: Self-pay | Admitting: Physician Assistant

## 2017-09-05 ENCOUNTER — Emergency Department (HOSPITAL_COMMUNITY): Payer: Self-pay

## 2017-09-05 ENCOUNTER — Emergency Department (HOSPITAL_COMMUNITY)
Admission: EM | Admit: 2017-09-05 | Discharge: 2017-09-05 | Disposition: A | Discharge status: Home / Self Care | Payer: Self-pay | Attending: Emergency Medicine | Admitting: Emergency Medicine

## 2017-09-05 DIAGNOSIS — E039 Hypothyroidism, unspecified: Secondary | ICD-10-CM

## 2017-09-05 DIAGNOSIS — S060X0A Concussion without loss of consciousness, initial encounter: Secondary | ICD-10-CM

## 2017-09-05 LAB — BASIC METABOLIC PANEL
Anion gap: 14 (ref 5–15)
BUN: <5 mg/dL — ABNORMAL LOW (ref 6–20)
CHLORIDE: 98 mmol/L — AB (ref 101–111)
CO2: 21 mmol/L — ABNORMAL LOW (ref 22–32)
Calcium: 8.4 mg/dL — ABNORMAL LOW (ref 8.9–10.3)
Creatinine, Ser: 0.61 mg/dL (ref 0.44–1.00)
GFR calc non Af Amer: >60 mL/min (ref >60)
Glucose, Bld: 102 mg/dL — ABNORMAL HIGH (ref 65–99)
POTASSIUM: 3.4 mmol/L — AB (ref 3.5–5.1)
SODIUM: 133 mmol/L — AB (ref 135–145)

## 2017-09-05 LAB — CBC
HEMATOCRIT: 32.5 % — AB (ref 36.0–46.0)
Hemoglobin: 10.3 g/dL — ABNORMAL LOW (ref 12.0–15.0)
MCH: 27.3 pg (ref 26.0–34.0)
MCHC: 31.7 g/dL (ref 30.0–36.0)
MCV: 86.2 fL (ref 78.0–100.0)
Platelets: 332 10*3/uL (ref 150–400)
RBC: 3.77 MIL/uL — AB (ref 3.87–5.11)
RDW: 21 % — ABNORMAL HIGH (ref 11.5–15.5)
WBC: 4 10*3/uL (ref 4.0–10.5)

## 2017-09-05 LAB — TSH: TSH: 0.036 u[IU]/mL — AB (ref 0.450–4.500)

## 2017-09-05 LAB — T3, FREE: T3 FREE: 2.2 pg/mL (ref 2.0–4.4)

## 2017-09-05 LAB — T4, FREE: FREE T4: 1.43 ng/dL (ref 0.82–1.77)

## 2017-09-05 MED ORDER — SODIUM CHLORIDE 0.9 % IV BOLUS (SEPSIS)
1000.0000 mL | Freq: Once | INTRAVENOUS | Status: AC
  Administered 2017-09-05: 1000 mL via INTRAVENOUS

## 2017-09-05 MED ORDER — ONDANSETRON 8 MG PO TBDP: 8.0000 mg | ORAL_TABLET | Freq: Three times a day (TID) | ORAL | 0 refills | Status: DC | PRN

## 2017-09-05 MED ORDER — PROCHLORPERAZINE EDISYLATE 5 MG/ML IJ SOLN
10.0000 mg | Freq: Once | INTRAMUSCULAR | Status: AC
  Administered 2017-09-05: 10 mg via INTRAVENOUS
  Filled 2017-09-05: qty 2

## 2017-09-05 NOTE — ED Provider Notes (Signed)
Victoria Travis   CSN: [id number] Arrival date & time: 09/04/17  1752     History   Chief Complaint Chief Complaint  Patient presents with  . Fall    HPI Victoria Travis is a 51 y.o. female.  HPI 51 year old female who tripped over her cat approximately 5 days ago.  She fell and injured her left forehead resulting in a laceration which was repaired by family members in the house.  She states since then she has had mild headache some confusion and more difficulty with simple tasks including while she was at work today.  She denies significant headache at this time.  She has had both nausea and vomiting.  She was seen in urgent care and sent to the ER for further evaluation.  Denies neck pain.  No weakness of her arms or legs.  Symptoms are moderate in severity   Past Medical History:  Diagnosis Date  . Thyroid disease     Patient Active Problem List   Diagnosis Date Noted  . 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 Cellulitis of foot 06/11/2012    History reviewed. No pertinent surgical history.  OB History    No data available       Home Medications    Prior to Admission medications   Medication Sig Start Date End Date Taking? Authorizing Provider  ibuprofen (ADVIL,MOTRIN) 200 MG tablet Take 200 mg by mouth every 6 (six) hours as needed for moderate pain.   Yes [provider]  levothyroxine (SYNTHROID, LEVOTHROID) 125 MCG tablet TAKE ONE TABLET BY MOUTH ONCE DAILY BEFORE BREAKFAST 08/01/17  Yes Trena PlattEnglish, Stephanie D, PA  Multiple Vitamin (MULTIVITAMIN) tablet Take 1 tablet by mouth daily.   Yes [provider]  ondansetron (ZOFRAN ODT) 8 MG disintegrating tablet Take 1 tablet (8 mg total) by mouth every 8 (eight) hours as needed for nausea or vomiting. 09/05/17   Azalia Bilisampos, Satomi Buda, MD    Family History No family history on file.  Social History Social History   Tobacco Use  . Smoking status:  Former Games developermoker  . Smokeless tobacco: Never Used  Substance Use Topics  . Alcohol use: Yes    Alcohol/week: 2.4 oz    Types: 4 Standard drinks or equivalent per week    Comment: beers nightly  . Drug use: No     Allergies   Patient has no known allergies.   Review of Systems Review of Systems  All other systems reviewed and are negative.    Physical Exam Updated Vital Signs BP (!) 149/99 (BP Location: Right Arm)   Pulse 95   Temp 98.3 F (36.8 C) (Oral)   Resp 12   Ht 5\' 6"  (1.676 m)   Wt 69.9 kg (154 lb)   LMP 08/13/2017   SpO2 100%   BMI 24.86 kg/m   Physical Exam  Constitutional: She is oriented to person, place, and time. She appears well-developed and well-nourished.  HENT:  Head: Normocephalic.  Healed laceration left supraorbital rim without significant swelling  Eyes: EOM are normal.  Neck: Normal range of motion.  No C-spine tenderness  Cardiovascular: Normal rate.  Pulmonary/Chest: Effort normal.  Abdominal: She exhibits no distension.  Musculoskeletal: Normal range of motion.  Neurological: She is alert and oriented to person, place, and time.  Psychiatric: She has a normal mood and affect.  Nursing Travis and vitals reviewed.    ED Treatments / Results  Labs (all labs ordered are listed, but only  abnormal results are displayed) Labs Reviewed  CBC - Abnormal; Notable for the following components:      Result Value   RBC 3.77 (*)    Hemoglobin 10.3 (*)    HCT 32.5 (*)    RDW 21.0 (*)    All other components within normal limits  BASIC METABOLIC PANEL    EKG  EKG Interpretation None       Radiology Ct Head Wo Contrast  Result Date: 09/05/2017 CLINICAL DATA:  Tripped over dog, fell 3 days ago. Loss of consciousness. Headache and nausea. EXAM: CT HEAD WITHOUT CONTRAST TECHNIQUE: Contiguous axial images were obtained from the base of the skull through the vertex without intravenous contrast. COMPARISON:  None. FINDINGS: BRAIN: No  intraparenchymal hemorrhage, mass effect nor midline shift. The ventricles and sulci are normal. No acute large vascular territory infarcts. No abnormal extra-axial fluid collections. Basal cisterns are patent. 9 mm pineal cyst. VASCULAR: Unremarkable. SKULL/SOFT TISSUES: No skull fracture. No significant soft tissue swelling. ORBITS/SINUSES: The included ocular globes and orbital contents are normal. Bilateral concha bullosa. The mastoid aircells and included paranasal sinuses are well-aerated. OTHER: None. IMPRESSION: 1. No acute intracranial process. 2. Subcentimeter pineal cyst, otherwise negative noncontrast CT HEAD. Electronically Signed   By: Awilda Metroourtnay  Bloomer M.D.   On: 09/05/2017 01:38    Procedures Procedures (including critical care time)  Medications Ordered in ED Medications  sodium chloride 0.9 % bolus 1,000 mL (1,000 mLs Intravenous New Bag/Given 09/05/17 0138)  prochlorperazine (COMPAZINE) injection 10 mg (10 mg Intravenous Given 09/05/17 0138)     Initial Impression / Assessment and Plan / ED Course  I have reviewed the triage vital signs and the nursing notes.  Pertinent labs & imaging results that were available during my care of the patient were reviewed by me and considered in my medical decision making (see chart for details).     Concussive symptoms.  Primary care follow-up.  Understands return to the ER for new or worsening symptoms.  Head CT negative.  C-spine nontender.  Final Clinical Impressions(s) / ED Diagnoses   Final diagnoses:  Concussion without loss of consciousness, initial encounter    ED Discharge Orders        Ordered    ondansetron (ZOFRAN ODT) 8 MG disintegrating tablet  Every 8 hours PRN     09/05/17 0239       Azalia Bilisampos, Jisele Price, MD 09/05/17 [phone]

## 2017-09-05 NOTE — Telephone Encounter (Signed)
Copied from CRM [id number]. Topic: Quick Communication - See Telephone Encounter >> Sep 05, 2017  2:42 PM Clack, Princella PellegriniJessica D wrote: CRM for notification. See Telephone encounter for:  Pt was calling for lab results.  09/05/17.

## 2017-09-06 ENCOUNTER — Other Ambulatory Visit: Payer: Self-pay | Admitting: Physician Assistant

## 2017-09-06 ENCOUNTER — Ambulatory Visit: Payer: Self-pay | Admitting: Physician Assistant

## 2017-09-06 MED ORDER — LEVOTHYROXINE SODIUM 100 MCG PO TABS
100.0000 ug | ORAL_TABLET | Freq: Every day | ORAL | 0 refills | Status: DC
Start: 2017-09-06 — End: 2018-01-01

## 2017-09-06 NOTE — Telephone Encounter (Signed)
Discussed the lab results, and advised to return today.  Patient notes that she feels much better today.  She was throwing up prior to her blood draw and not eating.  She is eating today.  She does not have a headache.  She has driven and gone to work without symptoms.  No chest pains, palpitations, or dizziness.   Went over blood work from 2 days ago.  We will decrease her dosing to 100mg .  She will return in 6-8 weeks for tsh recheck. She is declining coming in due to cost.   Discussed alarming symptoms to warrant an immediate return.

## 2017-09-06 NOTE — Telephone Encounter (Signed)
Sent to AutoNation[facility]phanie ENglish for lab results

## 2017-09-06 NOTE — Telephone Encounter (Signed)
Discussed with Judeth CornfieldStephanie.  Made appt for pt to be seen today [facility]. Pt also needs refill on thyroid meds.

## 2017-09-14 ENCOUNTER — Other Ambulatory Visit: Payer: Self-pay

## 2017-09-14 ENCOUNTER — Ambulatory Visit (INDEPENDENT_AMBULATORY_CARE_PROVIDER_SITE_OTHER): Payer: Self-pay | Admitting: Physician Assistant

## 2017-09-14 ENCOUNTER — Encounter: Payer: Self-pay | Admitting: Physician Assistant

## 2017-09-14 VITALS — BP 126/100 | HR 86 | Temp 98.2°F | Resp 16 | Ht 67.0 in | Wt 150.6 lb

## 2017-09-14 DIAGNOSIS — R112 Nausea with vomiting, unspecified: Secondary | ICD-10-CM

## 2017-09-14 DIAGNOSIS — R300 Dysuria: Secondary | ICD-10-CM

## 2017-09-14 DIAGNOSIS — R6883 Chills (without fever): Secondary | ICD-10-CM

## 2017-09-14 DIAGNOSIS — N3001 Acute cystitis with hematuria: Secondary | ICD-10-CM

## 2017-09-14 LAB — POCT URINALYSIS DIP (MANUAL ENTRY)
Bilirubin, UA: NEGATIVE
Glucose, UA: NEGATIVE mg/dL
Nitrite, UA: NEGATIVE
Protein Ur, POC: 30 mg/dL — AB
Spec Grav, UA: 1.025 (ref 1.010–1.025)
Urobilinogen, UA: 0.2 E.U./dL
pH, UA: 6.5 (ref 5.0–8.0)

## 2017-09-14 LAB — POCT CBC
Granulocyte percent: 80.2 %G — AB (ref 37–80)
HCT, POC: 35.4 % — AB (ref 37.7–47.9)
Hemoglobin: 11.4 g/dL — AB (ref 12.2–16.2)
Lymph, poc: 0.9 (ref 0.6–3.4)
MCH, POC: 27.6 pg (ref 27–31.2)
MCHC: 32.3 g/dL (ref 31.8–35.4)
MCV: 85.5 fL (ref 80–97)
MID (cbc): 0.2 (ref 0–0.9)
MPV: 7.2 fL (ref 0–99.8)
POC Granulocyte: 4.5 (ref 2–6.9)
POC LYMPH PERCENT: 16.7 %L (ref 10–50)
POC MID %: 3.1 % (ref 0–12)
Platelet Count, POC: 275 10*3/uL (ref 142–424)
RBC: 4.14 M/uL (ref 4.04–5.48)
RDW, POC: 22.8 %
WBC: 5.6 10*3/uL (ref 4.6–10.2)

## 2017-09-14 LAB — POC MICROSCOPIC URINALYSIS (UMFC)

## 2017-09-14 MED ORDER — ONDANSETRON HCL 4 MG/2ML IJ SOLN: 2.0000 mg | Freq: Once | INTRAMUSCULAR | Status: DC

## 2017-09-14 MED ORDER — ONDANSETRON 8 MG PO TBDP: 8.0000 mg | ORAL_TABLET | Freq: Three times a day (TID) | ORAL | 0 refills | Status: DC | PRN

## 2017-09-14 MED ORDER — NITROFURANTOIN MONOHYD MACRO 100 MG PO CAPS: 100.0000 mg | ORAL_CAPSULE | Freq: Two times a day (BID) | ORAL | 0 refills | Status: DC

## 2017-09-14 NOTE — Addendum Note (Signed)
Addended by: Sebastian AcheMCVEY, Destani Wamser WHITNEY on: 09/14/2017 03:25 PM   Modules accepted: Level of Service

## 2017-09-14 NOTE — Progress Notes (Signed)
Victoria Travis  MRN: [id number] DOB: 12/24/1965  PCP: Trena PlattEnglish, Stephanie D, PA  Subjective:  Pt is a 51 year old female who presents to clinic for frequent urination and nausea. Increased frequency x a few weeks. Dysuria x a few days.  Fever, nausea and chills x a few days. She has vomited multiple times today.  She has not been staying well hydrated.  Denies flank pain, back pain, abdominal pain, blood in her urine.  Last UTI last year.   Review of Systems  Constitutional: Positive for chills and diaphoresis. Negative for fatigue and fever.  Respiratory: Negative for cough, shortness of breath and wheezing.   Cardiovascular: Negative for chest pain and palpitations.  Gastrointestinal: Positive for nausea and vomiting. Negative for abdominal pain and diarrhea.  Genitourinary: Positive for dysuria, frequency and urgency. Negative for decreased urine volume, difficulty urinating, enuresis, flank pain and hematuria.  Musculoskeletal: Negative for back pain.  Neurological: Negative for dizziness, weakness, light-headedness and headaches.    Patient Active Problem List   Diagnosis Date Noted  . 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 Cellulitis of foot 06/11/2012    Current Outpatient Medications on File Prior to Visit  Medication Sig Dispense Refill  . ibuprofen (ADVIL,MOTRIN) 200 MG tablet Take 200 mg by mouth every 6 (six) hours as needed for moderate pain.    Marland Kitchen. levothyroxine (SYNTHROID, LEVOTHROID) 100 MCG tablet Take 1 tablet (100 mcg total) by mouth daily. 90 tablet 0  . Multiple Vitamin (MULTIVITAMIN) tablet Take 1 tablet by mouth daily.    . ondansetron (ZOFRAN ODT) 8 MG disintegrating tablet Take 1 tablet (8 mg total) by mouth every 8 (eight) hours as needed for nausea or vomiting. (Patient not taking: Reported on 09/14/2017) 10 tablet 0   Current Facility-Administered Medications on File Prior to Visit  Medication Dose Route Frequency Provider Last Rate Last Dose  .  triamcinolone acetonide (KENALOG-40) injection 40 mg  40 mg Intramuscular Once Carmelina DaneAnderson, Jeffery S, MD        No Known Allergies   Objective:  BP (!) 126/100   Pulse 86   Temp 98.2 F (36.8 C) (Oral)   Resp 16   Ht 5\' 7"  (1.702 m)   Wt 150 lb 9.6 oz (68.3 kg)   LMP 08/13/2017   SpO2 99%   BMI 23.59 kg/m   Physical Exam  Constitutional: She is oriented to person, place, and time and well-developed, well-nourished, and in no distress. No distress.  Cardiovascular: Normal rate, regular rhythm and normal heart sounds.  Abdominal: Soft. Normal appearance. There is tenderness in the suprapubic area. There is no CVA tenderness.  Neurological: She is alert and oriented to person, place, and time. GCS score is 15.  Skin: Skin is warm and dry.  Psychiatric: Mood, memory, affect and judgment normal.  Vitals reviewed.   Assessment and Plan :  1. Acute cystitis with hematuria - nitrofurantoin, macrocrystal-monohydrate, (MACROBID) 100 MG capsule; Take 1 capsule (100 mg total) by mouth 2 (two) times daily.  Dispense: 20 capsule; Refill: 0 - No concern today for pyelonephritis, WBC count is wnl and vital signs are stable, no CVA tenderness. Plan to treat for uncomplicated UTI. Culture is pending. Advised pt to stay well hydrated. RTC in 3-5 days if no improvement. She understands and agrees with plan.  2. Dysuria - Urine Culture - POCT urinalysis dipstick - POCT Microscopic Urinalysis ([facility])  3. Chills - POCT CBC  4. Nausea and vomiting, intractability of vomiting not specified, unspecified vomiting  type - ondansetron (ZOFRAN-ODT) 8 MG disintegrating tablet; Take 1 tablet (8 mg total) by mouth every 8 (eight) hours as needed for nausea.  Dispense: 20 tablet; Refill: 0    Marco CollieWhitney Salahuddin Arismendez, PA-C  [facility] 09/14/2017 2:38 PM

## 2017-09-14 NOTE — Patient Instructions (Addendum)
  Stay well hydrated. Drink 2-3 liters of water daily. Start taking your antibiotic. Take the entire course, even if you start to feel better before you finish.  Come back if you are not better in 3-5 days.   Urinary Tract Infection, Adult A urinary tract infection (UTI) is an infection of any part of the urinary tract. The urinary tract includes the:  Kidneys.  Ureters.  Bladder.  Urethra.  These organs make, store, and get rid of pee (urine) in the body. Follow these instructions at home:  Take over-the-counter and prescription medicines only as told by your doctor.  If you were prescribed an antibiotic medicine, take it as told by your doctor. Do not stop taking the antibiotic even if you start to feel better.  Avoid the following drinks: ? Alcohol. ? Caffeine. ? Tea. ? Carbonated drinks.  Drink enough fluid to keep your pee clear or pale yellow.  Keep all follow-up visits as told by your doctor. This is important.  Make sure to: ? Empty your bladder often and completely. Do not to hold pee for long periods of time. ? Empty your bladder before and after sex. ? Wipe from front to back after a bowel movement if you are female. Use each tissue one time when you wipe. Contact a doctor if:  You have back pain.  You have a fever.  You feel sick to your stomach (nauseous).  You throw up (vomit).  Your symptoms do not get better after 3 days.  Your symptoms go away and then come back. Get help right away if:  You have very bad back pain.  You have very bad lower belly (abdominal) pain.  You are throwing up and cannot keep down any medicines or water. This information is not intended to replace advice given to you by your health care provider. Make sure you discuss any questions you have with your health care provider. Document Released: 03/13/2008 Document Revised: 03/02/2016 Document Reviewed: 08/16/2015 Elsevier Interactive Patient Education  2018 [employer]  IF you received an x-ray today, you will receive an invoice from [facility]. Please contact [facility] at [phone] with questions or concerns regarding your invoice.   IF you received labwork today, you will receive an invoice from [facility]. Please contact [facility] at [phone] with questions or concerns regarding your invoice.   Our billing staff will not be able to assist you with questions regarding bills from these companies.  You will be contacted with the lab results as soon as they are available. The fastest way to get your results is to activate your My Chart account. Instructions are located on the last page of this paperwork. If you have not heard from us regarding the results in 2 weeks, please contact this office.

## 2017-09-18 LAB — URINE CULTURE

## 2017-12-12 ENCOUNTER — Ambulatory Visit: Payer: Self-pay | Admitting: Physician Assistant

## 2017-12-14 ENCOUNTER — Encounter: Payer: Self-pay | Admitting: Physician Assistant

## 2017-12-14 ENCOUNTER — Other Ambulatory Visit: Payer: Self-pay

## 2017-12-14 ENCOUNTER — Ambulatory Visit (INDEPENDENT_AMBULATORY_CARE_PROVIDER_SITE_OTHER): Payer: Self-pay | Admitting: Physician Assistant

## 2017-12-14 VITALS — BP 135/89 | HR 88 | Temp 98.3°F | Resp 16 | Ht 66.0 in | Wt 148.8 lb

## 2017-12-14 DIAGNOSIS — N3000 Acute cystitis without hematuria: Secondary | ICD-10-CM

## 2017-12-14 DIAGNOSIS — R300 Dysuria: Secondary | ICD-10-CM

## 2017-12-14 DIAGNOSIS — R350 Frequency of micturition: Secondary | ICD-10-CM

## 2017-12-14 LAB — POCT URINALYSIS DIP (MANUAL ENTRY)
Blood, UA: NEGATIVE
Glucose, UA: NEGATIVE mg/dL
Nitrite, UA: POSITIVE — AB
Protein Ur, POC: 30 mg/dL — AB
Spec Grav, UA: 1.02 (ref 1.010–1.025)
Urobilinogen, UA: 1 E.U./dL
pH, UA: 6 (ref 5.0–8.0)

## 2017-12-14 LAB — POC MICROSCOPIC URINALYSIS (UMFC)

## 2017-12-14 MED ORDER — PHENAZOPYRIDINE HCL 200 MG PO TABS: 200.0000 mg | ORAL_TABLET | Freq: Three times a day (TID) | ORAL | 0 refills | Status: DC | PRN

## 2017-12-14 MED ORDER — CIPROFLOXACIN HCL 500 MG PO TABS: 500.0000 mg | ORAL_TABLET | Freq: Two times a day (BID) | ORAL | 0 refills | Status: AC

## 2017-12-14 NOTE — Progress Notes (Signed)
Victoria DecampKaren Adebayo  MRN: [id number] DOB: 1966/06/16  PCP: Trena PlattEnglish, Stephanie D, PA  Subjective:  Pt is a 52 year old female who presents to clinic for burning with urination x 2 months. She was treated here for UTI with macrobid 2 months ago, but her symptoms never improved 100%. She did not want to come back due to lack of insurance.   Patient complains of abnormal smelling urine, burning with urination, frequency and urgency. She has had symptoms for 3 months. Patient also complains of fatigue. Patient denies back pain, fever and stomach ache. Patient does not have a history of recurrent UTI. Patient does not have a history of pyelonephritis.  She has tried AZO.   Review of Systems  Constitutional: Positive for fatigue. Negative for chills and fever.  Respiratory: Negative for cough, shortness of breath and wheezing.   Cardiovascular: Negative for chest pain and palpitations.  Gastrointestinal: Negative for abdominal pain, diarrhea, nausea and vomiting.  Genitourinary: Positive for dysuria, frequency and urgency. Negative for decreased urine volume, difficulty urinating, enuresis, flank pain and hematuria.  Musculoskeletal: Negative for back pain.  Neurological: Negative for dizziness, weakness, light-headedness and headaches.    Patient Active Problem List   Diagnosis Date Noted  . 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 Cellulitis of foot 06/11/2012    Current Outpatient Medications on File Prior to Visit  Medication Sig Dispense Refill  . ibuprofen (ADVIL,MOTRIN) 200 MG tablet Take 200 mg by mouth every 6 (six) hours as needed for moderate pain.    Marland Kitchen. levothyroxine (SYNTHROID, LEVOTHROID) 100 MCG tablet Take 1 tablet (100 mcg total) by mouth daily. 90 tablet 0  . Multiple Vitamin (MULTIVITAMIN) tablet Take 1 tablet by mouth daily.     Current Facility-Administered Medications on File Prior to Visit  Medication Dose Route Frequency Provider Last Rate Last Dose  . triamcinolone  acetonide (KENALOG-40) injection 40 mg  40 mg Intramuscular Once Carmelina DaneAnderson, Jeffery S, MD        No Known Allergies   Objective:  BP 135/89   Pulse 88   Temp 98.3 F (36.8 C) (Oral)   Resp 16   Ht 5\' 6"  (1.676 m)   Wt 148 lb 12.8 oz (67.5 kg)   LMP 12/05/2017   SpO2 98%   BMI 24.02 kg/m   Physical Exam  Constitutional: She is oriented to person, place, and time and well-developed, well-nourished, and in no distress. No distress.  Cardiovascular: Normal rate, regular rhythm and normal heart sounds.  Abdominal: Soft. Normal appearance. There is no tenderness. There is no CVA tenderness.  Neurological: She is alert and oriented to person, place, and time. GCS score is 15.  Skin: Skin is warm and dry.  Psychiatric: Mood, memory, affect and judgment normal.  Vitals reviewed.   Results for orders placed or performed in visit on 12/14/17  POCT urinalysis dipstick  Result Value Ref Range   Color, UA orange (A) yellow   Clarity, UA cloudy (A) clear   Glucose, UA negative negative mg/dL   Bilirubin, UA moderate (A) negative   Ketones, POC UA moderate (40) (A) negative mg/dL   Spec Grav, UA [id number] - 1.025   Blood, UA negative negative   pH, UA 6.0 5.0 - 8.0   Protein Ur, POC =30 (A) negative mg/dL   Urobilinogen, UA 1.0 0.2 or 1.0 E.U./dL   Nitrite, UA Positive (A) Negative   Leukocytes, UA Small (1+) (A) Negative  POCT Microscopic Urinalysis ([facility])  Result Value Ref Range  WBC,UR,HPF,POC Many (A) None WBC/hpf   RBC,UR,HPF,POC Few (A) None RBC/hpf   Bacteria Many (A) None, Too numerous to count   Mucus Present (A) Absent   Epithelial Cells, UR Per Microscopy Moderate (A) None, Too numerous to count cells/hpf   Crystals present     Assessment and Plan :  1. Acute cystitis without hematuria - ciprofloxacin (CIPRO) 500 MG tablet; Take 1 tablet (500 mg total) by mouth 2 (two) times daily for 7 days.  Dispense: 14 tablet; Refill: 0 - Pt presents with urinary symptoms x >2  months. She was treated for UTI with macrobid 2 months ago, but symptoms did not clear entirely. Vitals are stable. Not concerned today for pyelonephritis. Culture is pending. Will treat with Cipro for complicated UTI. Advised hydration. RTC if no improvement of symptoms.  2. Dysuria - phenazopyridine (PYRIDIUM) 200 MG tablet; Take 1 tablet (200 mg total) by mouth 3 (three) times daily as needed for pain.  Dispense: 10 tablet; Refill: 0  3. Urinary frequency - POCT urinalysis dipstick - POCT Microscopic Urinalysis ([facility]) - Urine Culture   Marco Collie, PA-C  Primary Care at [facility] 12/14/2017 1:30 PM

## 2017-12-14 NOTE — Patient Instructions (Addendum)
Take the ENTIRE COURSE of cipro, even if you start to feel better. This is an antibiotic that should clear your UTI.  Please come back if your symptoms worsen or do not improve.  Pyridium is for pain. Take this if you need it - use as directed.  Stay well hydrated. Drink at least 1-2 liters of water/daily. Avoid caffeine for the next week.     Thank you for coming in today. I hope you feel we met your needs.  Feel free to call PCP if you have any questions or further requests.  Please consider signing up for MyChart if you do not already have it, as this is a great way to communicate with me.  Best,  Whitney McVey, PA-C   Ciprofloxacin tablets What is this medicine? CIPROFLOXACIN (sip roe FLOX a sin) is a quinolone antibiotic. It is used to treat certain kinds of bacterial infections. It will not work for colds, flu, or other viral infections. This medicine may be used for other purposes; ask your health care provider or pharmacist if you have questions. COMMON BRAND NAME(S): Cipro What should I tell my health care provider before I take this medicine? They need to know if you have any of these conditions: -bone problems -history of low levels of potassium in the blood -joint problems -irregular heartbeat -kidney disease -myasthenia gravis -seizures -tendon problems -tingling of the fingers or toes, or other nerve disorder -an unusual or allergic reaction to ciprofloxacin, other antibiotics or medicines, foods, dyes, or preservatives -pregnant or trying to get pregnant -breast-feeding How should I use this medicine? Take this medicine by mouth with a glass of water. Follow the directions on the prescription label. Take your medicine at regular intervals. Do not take your medicine more often than directed. Take all of your medicine as directed even if you think your are better. Do not skip doses or stop your medicine early. You can take this medicine with food or on an empty  stomach. It can be taken with a meal that contains dairy or calcium, but do not take it alone with a dairy product, like milk or yogurt or calcium-fortified juice. A special MedGuide will be given to you by the pharmacist with each prescription and refill. Be sure to read this information carefully each time. Talk to your pediatrician regarding the use of this medicine in children. Special care may be needed. Overdosage: If you think you have taken too much of this medicine contact a poison control center or emergency room at once. NOTE: This medicine is only for you. Do not share this medicine with others. What if I miss a dose? If you miss a dose, take it as soon as you can. If it is almost time for your next dose, take only that dose. Do not take double or extra doses. What may interact with this medicine? Do not take this medicine with any of the following medications: -cisapride -dofetilide -dronedarone -flibanserin -lomitapide -pimozide -thioridazine -tizanidine -ziprasidone This medicine may also interact with the following medications: -antacids -birth control pills -caffeine -certain medicines for diabetes, like glipizide or glyburide -certain medicines that treat or prevent blood clots like warfarin -clozapine -cyclosporine -didanosine (ddI) buffered tablets or powder -duloxetine -lanthanum carbonate -lidocaine -methotrexate -multivitamins -NSAIDS, medicines for pain and inflammation, like ibuprofen or naproxen -olanzapine -omeprazole -other medicines that prolong the QT interval (cause an abnormal heart rhythm) -phenytoin -probenecid -ropinirole -sevelamer -sildenafil -sucralfate -theophylline -zolpidem This list may not describe all possible interactions. Give  your health care provider a list of all the medicines, herbs, non-prescription drugs, or dietary supplements you use. Also tell them if you smoke, drink alcohol, or use illegal drugs. Some items may  interact with your medicine. What should I watch for while using this medicine? Tell your doctor or health care professional if your symptoms do not improve. Do not treat diarrhea with over the counter products. Contact your doctor if you have diarrhea that lasts more than 2 days or if it is severe and watery. You may get drowsy or dizzy. Do not drive, use machinery, or do anything that needs mental alertness until you know how this medicine affects you. Do not stand or sit up quickly, especially if you are an older patient. This reduces the risk of dizzy or fainting spells. This medicine can make you more sensitive to the sun. Keep out of the sun. If you cannot avoid being in the sun, wear protective clothing and use sunscreen. Do not use sun lamps or tanning beds/booths. Avoid antacids, aluminum, calcium, iron, magnesium, and zinc products for 6 hours before and 2 hours after taking a dose of this medicine. What side effects may I notice from receiving this medicine? Side effects that you should report to your doctor or health care professional as soon as possible: -allergic reactions like skin rash or hives, swelling of the face, lips, or tongue -anxious -confusion -depressed mood -diarrhea -fast, irregular heartbeat -hallucination, loss of contact with reality -joint, muscle, or tendon pain or swelling -pain, tingling, numbness in the hands or feet -suicidal thoughts or other mood changes -sunburn -unusually weak or tired Side effects that usually do not require medical attention (report to your doctor or health care professional if they continue or are bothersome): -dry mouth -headache -nausea -trouble sleeping This list may not describe all possible side effects. Call your doctor for medical advice about side effects. You may report side effects to FDA at 1-800-FDA-1088. Where should I keep my medicine? Keep out of the reach of children. Store at room temperature below 30 degrees C  (86 degrees F). Keep container tightly closed. Throw away any unused medicine after the expiration date. NOTE: This sheet is a summary. It may not cover all possible information. If you have questions about this medicine, talk to your doctor, pharmacist, or health care provider.  2018 Elsevier/Gold Standard (2016-05-05 14:42:02)  IF you received an x-ray today, you will receive an invoice from [facility]. Please contact [facility] at [phone] with questions or concerns regarding your invoice.   IF you received labwork today, you will receive an invoice from [facility]. Please contact [facility] at [phone] with questions or concerns regarding your invoice.   Our billing staff will not be able to assist you with questions regarding bills from these companies.  You will be contacted with the lab results as soon as they are available. The fastest way to get your results is to activate your My Chart account. Instructions are located on the last page of this paperwork. If you have not heard from [place] regarding the results in 2 weeks, please contact this office.

## 2017-12-16 LAB — URINE CULTURE

## 2018-01-01 ENCOUNTER — Other Ambulatory Visit: Payer: Self-pay | Admitting: Physician Assistant

## 2018-01-09 ENCOUNTER — Encounter: Payer: Self-pay | Admitting: Physician Assistant

## 2018-02-26 ENCOUNTER — Other Ambulatory Visit: Payer: Self-pay

## 2018-02-26 ENCOUNTER — Ambulatory Visit: Payer: Self-pay | Admitting: Physician Assistant

## 2018-02-26 ENCOUNTER — Telehealth: Payer: Self-pay | Admitting: Physician Assistant

## 2018-02-26 ENCOUNTER — Encounter: Payer: Self-pay | Admitting: Physician Assistant

## 2018-02-26 VITALS — BP 128/88 | HR 97 | Temp 99.4°F | Resp 18 | Ht 66.0 in | Wt 145.8 lb

## 2018-02-26 DIAGNOSIS — R109 Unspecified abdominal pain: Secondary | ICD-10-CM

## 2018-02-26 DIAGNOSIS — N3000 Acute cystitis without hematuria: Secondary | ICD-10-CM

## 2018-02-26 DIAGNOSIS — R300 Dysuria: Secondary | ICD-10-CM

## 2018-02-26 DIAGNOSIS — F411 Generalized anxiety disorder: Secondary | ICD-10-CM

## 2018-02-26 LAB — POCT URINALYSIS DIP (MANUAL ENTRY)
Bilirubin, UA: NEGATIVE
Glucose, UA: NEGATIVE mg/dL
Leukocytes, UA: NEGATIVE
Nitrite, UA: POSITIVE — AB
PROTEIN UA: NEGATIVE mg/dL
RBC UA: NEGATIVE
UROBILINOGEN UA: 0.2 U/dL
pH, UA: 7 (ref 5.0–8.0)

## 2018-02-26 MED ORDER — NITROFURANTOIN MONOHYD MACRO 100 MG PO CAPS: 100.0000 mg | ORAL_CAPSULE | Freq: Two times a day (BID) | ORAL | 0 refills | Status: DC

## 2018-02-26 MED ORDER — DICYCLOMINE HCL 10 MG PO CAPS: 10.0000 mg | ORAL_CAPSULE | Freq: Three times a day (TID) | ORAL | 0 refills | Status: DC

## 2018-02-26 NOTE — Progress Notes (Signed)
Victoria Travis  MRN: [id number] DOB: [birth year]  PCP: Garnetta Buddy, PA  Chief Complaint  Patient presents with  . Dysuria    is currently going her menstrual cycle and having some diarrhea and upset stomach   . Urinary Frequency    Subjective:  Pt presents to clinic for concerns that she might have a UTI.  She started with urinary frequency last week.  She had 2 episodes of uncontrolled diarrhea (diarrhea is not unusual for her as she rountinely goes between either diarrhea or constipation, she routinely has a feel of urge to pass stool but many times cannot) prior to her symptoms starting.  Then this weekend she developed nausea with vomiting which has since resolved but her urinary symptoms got worse.  She is currently having dysuria and urinary frequency, but no back pain or fevers or chills.  For the last year she has either been constipated or diarrhea.  She has seen GI but they want to do colonoscopy, but she has no insurance but she is hoping she will get some with her new job.  She has no appetite and is afraid that if she eats she is not sure whether she might have diarrhea.  She has a lot of abdominal cramping also with her stool changes.  She has not monitored if it is related to what she eats or her anxiety level.  She has anxiety a lot of the time.  She has never been treated for that. She drinks 4 large vodka drinks daily sometimes mixed with seltzer water and that is how she is currently treating her symptoms.    Chart reviewed UTI 09/2017, 3, 2019 all E coli with urine culture  History is obtained by patient.  [employer] - drives 45 mins to work - she knows where every restroom is between work and home.  Review of Systems  Constitutional: Negative for chills and fever.  Gastrointestinal: Positive for abdominal pain (cramping and pressure). Negative for nausea.  Genitourinary: Positive for dysuria, frequency and urgency. Negative for flank pain, hematuria and  vaginal discharge.  Musculoskeletal: Negative for back pain.    Patient Active Problem List   Diagnosis Date Noted  . 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 Cellulitis of foot 06/11/2012    Current Outpatient Medications on File Prior to Visit  Medication Sig Dispense Refill  . ibuprofen (ADVIL,MOTRIN) 200 MG tablet Take 200 mg by mouth every 6 (six) hours as needed for moderate pain.    Marland Kitchen levothyroxine (SYNTHROID, LEVOTHROID) 100 MCG tablet TAKE 1 TABLET BY MOUTH ONCE DAILY 90 tablet 0  . Multiple Vitamin (MULTIVITAMIN) tablet Take 1 tablet by mouth daily.    . phenazopyridine (PYRIDIUM) 200 MG tablet Take 1 tablet (200 mg total) by mouth 3 (three) times daily as needed for pain. 10 tablet 0   Current Facility-Administered Medications on File Prior to Visit  Medication Dose Route Frequency Provider Last Rate Last Dose  . triamcinolone acetonide (KENALOG-40) injection 40 mg  40 mg Intramuscular Once Carmelina Dane, MD        No Known Allergies  Past Medical History:  Diagnosis Date  . Thyroid disease    Social History   Social History Narrative  . Not on file   Social History   Tobacco Use  . Smoking status: Former Games developer  . Smokeless tobacco: Never Used  Substance Use Topics  . Alcohol use: Yes    Alcohol/week: 2.4 oz    Types: 4 Standard drinks  or equivalent per week    Comment: 1/2 gallon vodka every 9 days - drinks daily - 4 large drinks  . Drug use: No   family history is not on file.     Objective:  BP 128/88   Pulse 97   Temp 99.4 F (37.4 C) (Oral)   Resp 18   Ht  (1.676 m)   Wt 145 lb 12.8 oz (66.1 kg)   LMP 02/21/2018   SpO2 99%   BMI 23.53 kg/m  Body mass index is 23.53 kg/m.  Physical Exam  Constitutional: She is oriented to person, place, and time. She appears well-developed and well-nourished.  HENT:  Head: Normocephalic and atraumatic.  Right Ear: External ear normal.  Left Ear: External ear normal.  Eyes: Conjunctivae  are normal.  Neck: Normal range of motion.  Cardiovascular: Normal rate, regular rhythm and normal heart sounds.  No murmur heard. Pulmonary/Chest: Effort normal and breath sounds normal.  Abdominal: Soft. There is no tenderness. There is no CVA tenderness.  Neurological: She is alert and oriented to person, place, and time.  Skin: Skin is warm, dry and intact.  Psychiatric: She has a normal mood and affect. Her behavior is normal. Judgment and thought content normal.  Vitals reviewed.   Assessment and Plan :  Dysuria - Plan: POCT urinalysis dipstick  Abdominal cramping - Plan: dicyclomine (BENTYL) 10 MG capsule  Acute cystitis without hematuria - Plan: nitrofurantoin, macrocrystal-monohydrate, (MACROBID) 100 MG capsule  Anxiety state   I suspect patient has IBS but she needs a colonoscopy due to age and continuous symptoms.  I suspect that this is causing her frequent UTIs.  We will treat UTIs but no culture today to help with financial strain and the act that she has had E Coli last 2 times and that would be suspected this time due to recent diarrhea.  We do a trial of bentyl for abd cramping - she will contact me through mychart with her results.  Encouraged her to consider colonoscopy but currently it is cost prohibitive.  She was encouraged to seek treatment for her anxiety as her ETOH usage is likely aggravating her gut issues as well as anxiety will make IBS worse.    Patient verbalized to me that they understood what her diagnosis is, what she need to do about it and why it is important that she do it.  See after visit summary for patient specific instructions.  Benny Lennert PA-C  Primary Care at [facility] 02/26/2018 6:38 PM

## 2018-02-26 NOTE — Patient Instructions (Addendum)
  Consider treatment for anxiety - I would suggest Lexapro and/or buspar --   Try to continue good water intake  Trial of bentyl to help with gut cramping   IF you received an x-ray today, you will receive an invoice from [facility]. Please contact [facility] at [phone] with questions or concerns regarding your invoice.   IF you received labwork today, you will receive an invoice from [facility]. Please contact [facility] at [phone] with questions or concerns regarding your invoice.   Our billing staff will not be able to assist you with questions regarding bills from these companies.  You will be contacted with the lab results as soon as they are available. The fastest way to get your results is to activate your My Chart account. Instructions are located on the last page of this paperwork. If you have not heard from [place] regarding the results in 2 weeks, please contact this office.

## 2018-02-26 NOTE — Telephone Encounter (Signed)
Copied from CRM [id number]. Topic: Quick Communication - Rx Refill/Question >> Feb 26, 2018  6:38 PM Zada Girt, [place] L wrote: Medication: dicyclomine (BENTYL) 10 MG capsule  Has the patient contacted their pharmacy? Yes.   (Agent: If no, request that the patient contact the pharmacy for the refill.) (Agent: If yes, when and what did the pharmacy advise?)  Preferred Pharmacy (with phone number or street name): [facility] [address], [place] - [address] [place] [address] Phone: [phone] Fax: [phone]  Agent: Please be advised that RX refills may take up to 3 business days. We ask that you follow-up with your pharmacy.  Pharmacy did not receive .script. Please resend.

## 2018-02-28 ENCOUNTER — Telehealth: Payer: Self-pay

## 2018-02-28 NOTE — Telephone Encounter (Signed)
Copied from CRM [id number]. Topic: General - Other >> Feb 27, 2018  8:27 AM Gerrianne Scale wrote: Reason for CRM: pt calling stating that she was suppose to get medicine for her Anxiety

## 2018-03-01 NOTE — Telephone Encounter (Signed)
What is next step for patient? Would you like to see her again to start medication?

## 2018-03-01 NOTE — Telephone Encounter (Signed)
lexapro or buspar listed as recommendation for treating anxiety. Please advise.

## 2018-03-01 NOTE — Telephone Encounter (Signed)
I would really rather see the patient to have a face to face conversation as we did not really talk about how to take, what to expect and possible side effects with these medications but she does not have insurance.  If she bulks about seeing me again ask her which one she is interested in.

## 2018-03-01 NOTE — Telephone Encounter (Signed)
We talked about medications that could be used but to the best of my memory we did not talk about starting them - I was just telling her what was available if she wanted to start treatment in the future.

## 2018-03-06 ENCOUNTER — Encounter (HOSPITAL_BASED_OUTPATIENT_CLINIC_OR_DEPARTMENT_OTHER): Payer: Self-pay | Admitting: Emergency Medicine

## 2018-03-06 ENCOUNTER — Other Ambulatory Visit: Payer: Self-pay

## 2018-03-06 ENCOUNTER — Inpatient Hospital Stay (HOSPITAL_COMMUNITY)
Admission: AD | Admit: 2018-03-06 | Discharge: 2018-03-10 | DRG: 885 | Disposition: A | Discharge status: Home / Self Care | Payer: No Typology Code available for payment source | Source: Intra-hospital | Attending: Psychiatry | Admitting: Psychiatry

## 2018-03-06 ENCOUNTER — Encounter (HOSPITAL_COMMUNITY): Payer: Self-pay

## 2018-03-06 ENCOUNTER — Emergency Department (HOSPITAL_BASED_OUTPATIENT_CLINIC_OR_DEPARTMENT_OTHER)
Admission: EM | Admit: 2018-03-06 | Discharge: 2018-03-06 | Disposition: A | Discharge status: Other Acute Inpatient Hospital | Payer: Self-pay | Attending: Emergency Medicine | Admitting: Emergency Medicine

## 2018-03-06 DIAGNOSIS — Z5689 Other problems related to employment: Secondary | ICD-10-CM | POA: Diagnosis not present

## 2018-03-06 DIAGNOSIS — R45851 Suicidal ideations: Secondary | ICD-10-CM | POA: Diagnosis present

## 2018-03-06 DIAGNOSIS — N390 Urinary tract infection, site not specified: Secondary | ICD-10-CM | POA: Diagnosis present

## 2018-03-06 DIAGNOSIS — S61512A Laceration without foreign body of left wrist, initial encounter: Secondary | ICD-10-CM | POA: Insufficient documentation

## 2018-03-06 DIAGNOSIS — K589 Irritable bowel syndrome without diarrhea: Secondary | ICD-10-CM | POA: Diagnosis present

## 2018-03-06 DIAGNOSIS — F419 Anxiety disorder, unspecified: Secondary | ICD-10-CM | POA: Diagnosis not present

## 2018-03-06 DIAGNOSIS — Z87891 Personal history of nicotine dependence: Secondary | ICD-10-CM | POA: Insufficient documentation

## 2018-03-06 DIAGNOSIS — Z811 Family history of alcohol abuse and dependence: Secondary | ICD-10-CM | POA: Diagnosis not present

## 2018-03-06 DIAGNOSIS — G4700 Insomnia, unspecified: Secondary | ICD-10-CM | POA: Diagnosis not present

## 2018-03-06 DIAGNOSIS — Y999 Unspecified external cause status: Secondary | ICD-10-CM | POA: Insufficient documentation

## 2018-03-06 DIAGNOSIS — F1020 Alcohol dependence, uncomplicated: Secondary | ICD-10-CM | POA: Diagnosis not present

## 2018-03-06 DIAGNOSIS — E890 Postprocedural hypothyroidism: Secondary | ICD-10-CM | POA: Diagnosis present

## 2018-03-06 DIAGNOSIS — Z915 Personal history of self-harm: Secondary | ICD-10-CM | POA: Diagnosis not present

## 2018-03-06 DIAGNOSIS — Z9114 Patient's other noncompliance with medication regimen: Secondary | ICD-10-CM

## 2018-03-06 DIAGNOSIS — Z62810 Personal history of physical and sexual abuse in childhood: Secondary | ICD-10-CM | POA: Diagnosis not present

## 2018-03-06 DIAGNOSIS — E039 Hypothyroidism, unspecified: Secondary | ICD-10-CM | POA: Diagnosis not present

## 2018-03-06 DIAGNOSIS — F322 Major depressive disorder, single episode, severe without psychotic features: Principal | ICD-10-CM | POA: Diagnosis present

## 2018-03-06 DIAGNOSIS — Z23 Encounter for immunization: Secondary | ICD-10-CM | POA: Insufficient documentation

## 2018-03-06 DIAGNOSIS — F332 Major depressive disorder, recurrent severe without psychotic features: Secondary | ICD-10-CM | POA: Insufficient documentation

## 2018-03-06 DIAGNOSIS — Y939 Activity, unspecified: Secondary | ICD-10-CM | POA: Insufficient documentation

## 2018-03-06 DIAGNOSIS — Y929 Unspecified place or not applicable: Secondary | ICD-10-CM | POA: Insufficient documentation

## 2018-03-06 DIAGNOSIS — F10220 Alcohol dependence with intoxication, uncomplicated: Secondary | ICD-10-CM | POA: Insufficient documentation

## 2018-03-06 DIAGNOSIS — X789XXA Intentional self-harm by unspecified sharp object, initial encounter: Secondary | ICD-10-CM | POA: Insufficient documentation

## 2018-03-06 DIAGNOSIS — Z62811 Personal history of psychological abuse in childhood: Secondary | ICD-10-CM | POA: Diagnosis not present

## 2018-03-06 DIAGNOSIS — Y908 Blood alcohol level of 240 mg/100 ml or more: Secondary | ICD-10-CM | POA: Insufficient documentation

## 2018-03-06 DIAGNOSIS — Z008 Encounter for other general examination: Secondary | ICD-10-CM | POA: Insufficient documentation

## 2018-03-06 DIAGNOSIS — Z79899 Other long term (current) drug therapy: Secondary | ICD-10-CM | POA: Insufficient documentation

## 2018-03-06 DIAGNOSIS — R300 Dysuria: Secondary | ICD-10-CM | POA: Insufficient documentation

## 2018-03-06 LAB — CBC
HCT: 36.3 % (ref 36.0–46.0)
Hemoglobin: 12.4 g/dL (ref 12.0–15.0)
MCH: 31.2 pg (ref 26.0–34.0)
MCHC: 34.2 g/dL (ref 30.0–36.0)
MCV: 91.2 fL (ref 78.0–100.0)
PLATELETS: 291 10*3/uL (ref 150–400)
RBC: 3.98 MIL/uL (ref 3.87–5.11)
RDW: 18.8 % — AB (ref 11.5–15.5)
WBC: 5.3 10*3/uL (ref 4.0–10.5)

## 2018-03-06 LAB — RAPID URINE DRUG SCREEN, HOSP PERFORMED
Amphetamines: NOT DETECTED
BENZODIAZEPINES: NOT DETECTED
Barbiturates: NOT DETECTED
COCAINE: NOT DETECTED
OPIATES: NOT DETECTED
Tetrahydrocannabinol: NOT DETECTED

## 2018-03-06 LAB — ETHANOL
ALCOHOL ETHYL (B): 376 mg/dL — AB (ref <10)
Alcohol, Ethyl (B): 246 mg/dL — ABNORMAL HIGH (ref <10)

## 2018-03-06 LAB — SALICYLATE LEVEL

## 2018-03-06 LAB — COMPREHENSIVE METABOLIC PANEL
ALBUMIN: 3.8 g/dL (ref 3.5–5.0)
ALK PHOS: 58 U/L (ref 38–126)
ALT: 117 U/L — ABNORMAL HIGH (ref 14–54)
AST: 202 U/L — ABNORMAL HIGH (ref 15–41)
Anion gap: 16 — ABNORMAL HIGH (ref 5–15)
BILIRUBIN TOTAL: 0.4 mg/dL (ref 0.3–1.2)
BUN: 9 mg/dL (ref 6–20)
CALCIUM: 8.3 mg/dL — AB (ref 8.9–10.3)
CO2: 21 mmol/L — ABNORMAL LOW (ref 22–32)
Chloride: 106 mmol/L (ref 101–111)
Creatinine, Ser: 0.47 mg/dL (ref 0.44–1.00)
GFR calc non Af Amer: >60 mL/min (ref >60)
Glucose, Bld: 84 mg/dL (ref 65–99)
POTASSIUM: 3.5 mmol/L (ref 3.5–5.1)
Sodium: 143 mmol/L (ref 135–145)
TOTAL PROTEIN: 7 g/dL (ref 6.5–8.1)

## 2018-03-06 LAB — URINALYSIS, ROUTINE W REFLEX MICROSCOPIC
Bilirubin Urine: NEGATIVE
Glucose, UA: NEGATIVE mg/dL
Hgb urine dipstick: NEGATIVE
Ketones, ur: >80 mg/dL — AB
LEUKOCYTES UA: NEGATIVE
NITRITE: NEGATIVE
PH: 6 (ref 5.0–8.0)
Protein, ur: NEGATIVE mg/dL
SPECIFIC GRAVITY, URINE: 1.01 (ref 1.005–1.030)

## 2018-03-06 LAB — PREGNANCY, URINE: Preg Test, Ur: NEGATIVE

## 2018-03-06 LAB — ACETAMINOPHEN LEVEL

## 2018-03-06 MED ORDER — HYDROXYZINE HCL 25 MG PO TABS: 25.0000 mg | ORAL_TABLET | Freq: Four times a day (QID) | ORAL | Status: AC | PRN

## 2018-03-06 MED ORDER — THIAMINE HCL 100 MG/ML IJ SOLN: 100.0000 mg | Freq: Every day | INTRAMUSCULAR | Status: DC

## 2018-03-06 MED ORDER — ONDANSETRON HCL 8 MG PO TABS: 4.0000 mg | ORAL_TABLET | Freq: Three times a day (TID) | ORAL | Status: DC | PRN

## 2018-03-06 MED ORDER — LOPERAMIDE HCL 2 MG PO CAPS: 2.0000 mg | ORAL_CAPSULE | ORAL | Status: AC | PRN

## 2018-03-06 MED ORDER — LORAZEPAM 1 MG PO TABS: 1.0000 mg | ORAL_TABLET | Freq: Every day | ORAL | Status: DC

## 2018-03-06 MED ORDER — LORAZEPAM 1 MG PO TABS
1.0000 mg | ORAL_TABLET | Freq: Three times a day (TID) | ORAL | Status: AC
  Administered 2018-03-08 – 2018-03-09 (×3): 1 mg via ORAL
  Filled 2018-03-06 (×3): qty 1

## 2018-03-06 MED ORDER — TETANUS-DIPHTH-ACELL PERTUSSIS 5-2.5-18.5 LF-MCG/0.5 IM SUSP
0.5000 mL | Freq: Once | INTRAMUSCULAR | Status: AC
  Administered 2018-03-06: 0.5 mL via INTRAMUSCULAR
  Filled 2018-03-06: qty 0.5

## 2018-03-06 MED ORDER — ENSURE ENLIVE PO LIQD
237.0000 mL | Freq: Two times a day (BID) | ORAL | Status: DC
  Administered 2018-03-07 – 2018-03-10 (×6): 237 mL via ORAL

## 2018-03-06 MED ORDER — LORAZEPAM 2 MG/ML IJ SOLN: 1.0000 mg | Freq: Four times a day (QID) | INTRAMUSCULAR | Status: DC | PRN

## 2018-03-06 MED ORDER — VITAMIN B-1 100 MG PO TABS
100.0000 mg | ORAL_TABLET | Freq: Every day | ORAL | Status: DC
  Administered 2018-03-07 – 2018-03-10 (×4): 100 mg via ORAL
  Filled 2018-03-06 (×6): qty 1

## 2018-03-06 MED ORDER — NITROFURANTOIN MONOHYD MACRO 100 MG PO CAPS
100.0000 mg | ORAL_CAPSULE | Freq: Two times a day (BID) | ORAL | Status: AC
  Administered 2018-03-06 – 2018-03-10 (×8): 100 mg via ORAL
  Filled 2018-03-06 (×9): qty 1

## 2018-03-06 MED ORDER — LORAZEPAM 1 MG PO TABS: 1.0000 mg | ORAL_TABLET | Freq: Four times a day (QID) | ORAL | Status: DC | PRN

## 2018-03-06 MED ORDER — LORAZEPAM 1 MG PO TABS
1.0000 mg | ORAL_TABLET | Freq: Four times a day (QID) | ORAL | Status: AC
  Administered 2018-03-07 – 2018-03-08 (×4): 1 mg via ORAL
  Filled 2018-03-06 (×4): qty 1

## 2018-03-06 MED ORDER — THIAMINE HCL 100 MG/ML IJ SOLN
100.0000 mg | Freq: Every day | INTRAMUSCULAR | Status: DC
  Filled 2018-03-06: qty 2

## 2018-03-06 MED ORDER — LORAZEPAM 1 MG PO TABS
0.0000 mg | ORAL_TABLET | Freq: Four times a day (QID) | ORAL | Status: DC
  Administered 2018-03-06: 2 mg via ORAL
  Filled 2018-03-06: qty 2

## 2018-03-06 MED ORDER — LEVOTHYROXINE SODIUM 100 MCG PO TABS
100.0000 ug | ORAL_TABLET | Freq: Every day | ORAL | Status: DC
  Administered 2018-03-07 – 2018-03-10 (×4): 100 ug via ORAL
  Filled 2018-03-06 (×5): qty 1
  Filled 2018-03-06: qty 7
  Filled 2018-03-06: qty 1

## 2018-03-06 MED ORDER — LORAZEPAM 2 MG/ML IJ SOLN: 0.0000 mg | Freq: Two times a day (BID) | INTRAMUSCULAR | Status: DC

## 2018-03-06 MED ORDER — ONDANSETRON 4 MG PO TBDP: 4.0000 mg | ORAL_TABLET | Freq: Four times a day (QID) | ORAL | Status: AC | PRN

## 2018-03-06 MED ORDER — LORAZEPAM 1 MG PO TABS
1.0000 mg | ORAL_TABLET | Freq: Two times a day (BID) | ORAL | Status: DC
  Administered 2018-03-10: 1 mg via ORAL
  Filled 2018-03-06: qty 1

## 2018-03-06 MED ORDER — LORAZEPAM 1 MG PO TABS: 1.0000 mg | ORAL_TABLET | Freq: Four times a day (QID) | ORAL | Status: AC | PRN

## 2018-03-06 MED ORDER — LORAZEPAM 2 MG/ML IJ SOLN: 0.0000 mg | Freq: Four times a day (QID) | INTRAMUSCULAR | Status: DC

## 2018-03-06 MED ORDER — FOLIC ACID 1 MG PO TABS
1.0000 mg | ORAL_TABLET | Freq: Every day | ORAL | Status: DC
  Administered 2018-03-06 – 2018-03-10 (×5): 1 mg via ORAL
  Filled 2018-03-06 (×8): qty 1

## 2018-03-06 MED ORDER — ACETAMINOPHEN 325 MG PO TABS: 650.0000 mg | ORAL_TABLET | ORAL | Status: DC | PRN

## 2018-03-06 MED ORDER — LORAZEPAM 1 MG PO TABS
0.0000 mg | ORAL_TABLET | Freq: Four times a day (QID) | ORAL | Status: DC
  Administered 2018-03-06: 1 mg via ORAL
  Filled 2018-03-06: qty 1

## 2018-03-06 MED ORDER — LORAZEPAM 1 MG PO TABS
0.0000 mg | ORAL_TABLET | Freq: Two times a day (BID) | ORAL | Status: DC
Start: 2018-03-08 — End: 2018-03-06

## 2018-03-06 MED ORDER — LORAZEPAM 1 MG PO TABS: 0.0000 mg | ORAL_TABLET | Freq: Two times a day (BID) | ORAL | Status: DC

## 2018-03-06 MED ORDER — VITAMIN B-1 100 MG PO TABS
100.0000 mg | ORAL_TABLET | Freq: Every day | ORAL | Status: DC
  Administered 2018-03-06: 100 mg via ORAL
  Filled 2018-03-06: qty 1

## 2018-03-06 MED ORDER — ALUM & MAG HYDROXIDE-SIMETH 200-200-20 MG/5ML PO SUSP: 30.0000 mL | Freq: Four times a day (QID) | ORAL | Status: DC | PRN

## 2018-03-06 MED ORDER — ADULT MULTIVITAMIN W/MINERALS CH
1.0000 | ORAL_TABLET | Freq: Every day | ORAL | Status: DC
  Administered 2018-03-06 – 2018-03-10 (×5): 1 via ORAL
  Filled 2018-03-06 (×8): qty 1

## 2018-03-06 NOTE — ED Triage Notes (Signed)
Patient reports feeling bad for 1 week.  Reports that she was diagnosed with a UTI.  Reports began taking nitofurantoin for UTI but states "this is not working".  Spouse states that she has hx of depression but states she is more depressed than normal.  Patient cut her left wrist last night.  Patient reports she does not have thoughts of hurting herself at the present but states "I just want to take a nap and not wake up".   Spouse states she does have access to a gun at home.  Continues to c/o dysuria as well.

## 2018-03-06 NOTE — ED Provider Notes (Signed)
[facility] EMERGENCY DEPARTMENT Provider Note   CSN: [id number] Arrival date & time: 03/06/18  4098     History   Chief Complaint Chief Complaint  Patient presents with  . Suicidal    HPI Victoria Travis is a 52 y.o. female.  52 year old female with past medical history below including thyroid disease who presents with anxiety and depression.  Patient was brought in by her husband is concerned about her worsening depression.  She notes she has a history of anxiety and had been talking to PCP about starting medications but has not started any medicines yet.  Her depression has been worse recently.  She reports poor sleep, severe fatigue, and thoughts of "I just want to take a nap and not wake up" as well as "I just want to end it all." She cut her left wrist last night "when things got really bad". She denies h/o suicide attempt or cutting behavior. She admits to alcohol use but states she does not drink a lot. Denies drug use.   Of note, she was having dysuria recently and saw PCP who started her on nitrofurantoin. She has missed a few doses but is still on the antibiotic course. She reports improvement in symptoms. She has ongoing problems with N/V and intermittent diarrhea.  Her PCP suspects IBS but she has not been able to have a colonoscopy due to no insurance.  The history is provided by the patient.    Past Medical History:  Diagnosis Date  . Thyroid disease     Patient Active Problem List   Diagnosis Date Noted  . 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 Cellulitis of foot 06/11/2012    History reviewed. No pertinent surgical history.   OB History   None      Home Medications    Prior to Admission medications   Medication Sig Start Date End Date Taking? Authorizing Provider  dicyclomine (BENTYL) 10 MG capsule Take 1 capsule (10 mg total) by mouth 4 (four) times daily -  before meals and at bedtime. 02/26/18   Weber, Dema Severin, PA-C  ibuprofen  (ADVIL,MOTRIN) 200 MG tablet Take 200 mg by mouth every 6 (six) hours as needed for moderate pain.    [provider]  levothyroxine (SYNTHROID, LEVOTHROID) 100 MCG tablet TAKE 1 TABLET BY MOUTH ONCE DAILY 01/01/18   Trena Platt D, PA  Multiple Vitamin (MULTIVITAMIN) tablet Take 1 tablet by mouth daily.    [provider]  nitrofurantoin, macrocrystal-monohydrate, (MACROBID) 100 MG capsule Take 1 capsule (100 mg total) by mouth 2 (two) times daily. 02/26/18   Weber, Dema Severin, PA-C  phenazopyridine (PYRIDIUM) 200 MG tablet Take 1 tablet (200 mg total) by mouth 3 (three) times daily as needed for pain. 12/14/17   McVey, Madelaine Bhat, PA-C    Family History History reviewed. No pertinent family history.  Social History Social History   Tobacco Use  . Smoking status: Former Games developer  . Smokeless tobacco: Never Used  Substance Use Topics  . Alcohol use: Yes    Alcohol/week: 2.4 oz    Types: 4 Standard drinks or equivalent per week    Comment: 1/2 gallon vodka every 9 days - drinks daily - 4 large drinks  . Drug use: No     Allergies   Patient has no known allergies.   Review of Systems Review of Systems All other systems reviewed and are negative except that which was mentioned in HPI   Physical Exam Updated Vital Signs BP Marland Kitchen)  142/103 (BP Location: Right Arm)   Pulse 77   Temp 98.4 F (36.9 C) (Oral)   Resp 18   Ht  (1.676 m)   Wt 65.8 kg (145 lb)   LMP 02/21/2018   SpO2 96%   BMI 23.40 kg/m   Physical Exam  Constitutional: She is oriented to person, place, and time. She appears well-developed and well-nourished.  HENT:  Head: Normocephalic and atraumatic.  Moist mucous membranes  Eyes: Pupils are equal, round, and reactive to light. Conjunctivae are normal.  Neck: Neck supple.  Cardiovascular: Normal rate, regular rhythm and normal heart sounds.  No murmur heard. Pulmonary/Chest: Effort normal and breath sounds normal.  Abdominal:  Soft. Bowel sounds are normal. She exhibits no distension. There is no tenderness.  Musculoskeletal: She exhibits no edema.  Neurological: She is alert and oriented to person, place, and time.  Fluent speech  Skin: Skin is warm and dry.  Superficial, linear laceration on L volar wrist  Psychiatric: Her speech is slurred. She is slowed and withdrawn. She exhibits a depressed mood. She expresses suicidal ideation.  Tearful, slowed speech, depressed mood  Nursing note and vitals reviewed.    ED Treatments / Results  Labs (all labs ordered are listed, but only abnormal results are displayed) Labs Reviewed  COMPREHENSIVE METABOLIC PANEL - Abnormal; Notable for the following components:      Result Value   CO2 21 (*)    Calcium 8.3 (*)    AST 202 (*)    ALT 117 (*)    Anion gap 16 (*)    All other components within normal limits  ETHANOL - Abnormal; Notable for the following components:   Alcohol, Ethyl (B) 376 (*)    All other components within normal limits  ACETAMINOPHEN LEVEL - Abnormal; Notable for the following components:   Acetaminophen (Tylenol), Serum <10 (*)    All other components within normal limits  CBC - Abnormal; Notable for the following components:   RDW 18.8 (*)    All other components within normal limits  URINALYSIS, ROUTINE W REFLEX MICROSCOPIC - Abnormal; Notable for the following components:   Ketones, ur >80 (*)    All other components within normal limits  URINE CULTURE  SALICYLATE LEVEL  RAPID URINE DRUG SCREEN, HOSP PERFORMED  PREGNANCY, URINE    EKG None  Radiology No results found.  Procedures Procedures (including critical care time)  Medications Ordered in ED Medications  ondansetron (ZOFRAN) tablet 4 mg (has no administration in time range)  alum & mag hydroxide-simeth (MAALOX/MYLANTA) 200-200-20 MG/5ML suspension 30 mL (has no administration in time range)  acetaminophen (TYLENOL) tablet 650 mg (has no administration in time range)    LORazepam (ATIVAN) injection 0-4 mg ( Intravenous See Alternative 03/06/18 1115)    Or  LORazepam (ATIVAN) tablet 0-4 mg (1 mg Oral Given 03/06/18 1115)  LORazepam (ATIVAN) injection 0-4 mg (has no administration in time range)    Or  LORazepam (ATIVAN) tablet 0-4 mg (has no administration in time range)  thiamine (VITAMIN B-1) tablet 100 mg (100 mg Oral Given 03/06/18 1115)    Or  thiamine (B-1) injection 100 mg ( Intravenous See Alternative 03/06/18 1115)  Tdap (BOOSTRIX) injection 0.5 mL (0.5 mLs Intramuscular Given 03/06/18 1220)     Initial Impression / Assessment and Plan / ED Course  I have reviewed the triage vital signs and the nursing notes.  Pertinent labs & imaging results that were available during my care of the patient  were reviewed by me and considered in my medical decision making (see chart for details).    Pt was depressed and appeared intoxicated on exam. VSS. Labs notable for ethanol 376, AST 202 and ALT 117, AG 16 all likely 2/2 alcohol use. UA without evidence of infection. Contacted TTS as I am very concerned about her escalating depression sx and wrist cutting.  TTS evaluated patient and spoke with husband over the phone. They have determined she requires inpatient placement. She will be held in ED until bed becomes available. Patient is currently voluntary therefore no IVC completed, however if she tries to leave I will complete IVC papers given wrist cutting in suicide attempt and ongoing depression symptoms. Final Clinical Impressions(s) / ED Diagnoses   Final diagnoses:  None    ED Discharge Orders    None       Little, Ambrose Finland, MD 03/06/18 1407

## 2018-03-06 NOTE — Consult Note (Signed)
  Tele Assessment   Victoria Travis, 52 y.o., female patient presented to [facility] with suicidal ideation; superficial laceration to wrist and intoxicated.   Patient seen via telepsych by TTS and  this provider; chart reviewed and consulted with Dr. Lucianne Muss on 03/06/18.  On evaluation Victoria Travis reports that she got intoxicated yesterday and started to haves some suicidal thoughts and took a knife and tried to kill herself; but only ended up making a small cut.  ""it wasn't even deep enough.  I'm horrible at killing myself too."  Reports that it is a superficial laceration on her wrist; but her intent when made the laceration  Yesterday was to kill herself.  States that she did not tell her husband about it until this morning.  Patient states that she is not feeling suicidal today and that she has a lot to live for.  States that she has a 23 yr old son and she would not kill herself leaving him behind.  "I have a lot to live for.  I have my son and husband; and a good job.  I just want to put this behind me like this never happened."  Patient states that she has no prior history of suicide attempt.  Patient is interested in outpatient services.  During evaluation Victoria Travis is alert/oriented x 4; calm/cooperative; and mood congruent with affect.  Patient states that she is embarrassed about incident and is laughing about it one minute and tearful about someone finding out the next.  She does not appear to be responding to internal/external stimuli or delusional thoughts.  Patient denies suicidal/self-harm/homicidal ideation, psychosis, and paranoia; but admits that she did try to kill herself yesterday.  Patient answered question appropriately.  Patient agreed to let TTS speak with her husband Vincenza Hews [phone]) for collateral information.  Patient husband does not feel safe with patient coming home; states that he is afraid to leave her at home and he is afraid that she will hurt herself.        Recommendation:  Inpatient psychiatric treatment.     Ari Bernabei B. Baraa Tubbs, NP

## 2018-03-06 NOTE — Progress Notes (Signed)
Patient ID: Victoria Travis, female   DOB: 10/08/1966, 53 y.o.   MRN: [id number]  Pt presents to [facility] hospital with complaints of "one time fluke" suicidal ideation and self harm. Pt reports that this came as a combination of "having a UTI, IBS like stomach problems and mood swings from pre-menopause." Pt currently denies SI, HI and AVH. Verbally agrees to contact staff before acting on any harmful thoughts. Presents with signs and symptoms of withdrawal including fatigue, anxiety, tremors, and diarrhea. Pt has not been sleeping well PTA, wishes to be started on a sleep medication ans her UTI antibiotic, Macrobid. Pt has concerns that she will lose her job and wishes they NOT be notified that she is at [facility]. 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 Pt reports drinking 3 vodkas a day and drinks 6 or more drinks once a week. ALT and AST are elevated. BAL was 375 on arrival to ED. Consents signed, skin/belongings search completed and pt oriented to unit. Pt stable at this time. Pt given the opportunity to express concerns and ask questions. Pt given toiletries and meal. Will continue to monitor.

## 2018-03-06 NOTE — ED Provider Notes (Signed)
Blood pressure (!) 142/103, pulse 77, temperature 98.4 F (36.9 C), temperature source Oral, resp. rate 18, height  (1.676 m), weight 65.8 kg (145 lb), last menstrual period 02/21/2018, SpO2 96 %.  Assuming care from Dr. Clarene Duke.  In short, Victoria Travis is a 52 y.o. female with a chief complaint of Suicidal .  Refer to the original H&P for additional details.  Patient is medically clear for psychiatry evaluation.   Alona Bene, MD    Maia Plan, MD 03/06/18 [phone]

## 2018-03-06 NOTE — Progress Notes (Signed)
Nursing Progress Note: 7p-7a D: Pt currently presents with a anxious/sad/ambivalent affect and behavior. Pt states "I am worried I will lose my job. I can't be gone for 5 days. I'm worried. I need to get out of here as soon as possible." Interacting appropriately with the milieu. Pt reports poor sleep at home. Pt did attend wrap-up group.  A: Pt provided with medications per providers orders. Pt's labs and vitals were monitored throughout the night. Pt supported emotionally and encouraged to express concerns and questions. Pt educated on medications.  R: Pt's safety ensured with 15 minute and environmental checks. Pt currently denies SI, HI, and AVH. Pt verbally contracts to seek staff if SI,HI, or AVH occurs and to consult with staff before acting on any harmful thoughts. Will continue to monitor.

## 2018-03-06 NOTE — ED Notes (Signed)
Patient signed form for voluntary admission.

## 2018-03-06 NOTE — Telephone Encounter (Signed)
Patient currently in ED - provider, FYI. Schedule office visit for patient?
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
## 2018-03-06 NOTE — ED Notes (Signed)
Pt sts that her toes and fingers are "numb and tingling". Dr. Clarene Duke made aware.  She will go see pt asap.  Pt and husband aware.

## 2018-03-06 NOTE — ED Notes (Signed)
Date and time results received: 03/06/18 0956   Test: ethanol Critical Value: 376 Name of Provider Notified:Little  Orders Received? Or Actions Taken?: no orders given

## 2018-03-06 NOTE — ED Notes (Signed)
TTS at the bedside. 

## 2018-03-06 NOTE — Tx Team (Signed)
Initial Treatment Plan 03/06/2018 6:47 PM Victoria Travis ZOX:[id number]    PATIENT STRESSORS: Marital or family conflict Substance abuse   PATIENT STRENGTHS: Ability for insight Capable of independent living Metallurgist fund of knowledge   PATIENT IDENTIFIED PROBLEMS: Depression  Substance Abuse - "I drink 3 vodkas a day"  Suicide Risk  "I want to stick with the program even though this was a fluke"  "I want to be healthier and fix this depression"             DISCHARGE CRITERIA:  Ability to meet basic life and health needs Improved stabilization in mood, thinking, and/or behavior Withdrawal symptoms are absent or subacute and managed without 24-hour nursing intervention  PRELIMINARY DISCHARGE PLAN: Attend 12-step recovery group Outpatient therapy Participate in family therapy  PATIENT/FAMILY INVOLVEMENT: This treatment plan has been presented to and reviewed with the patient, Victoria Travis. The patient and family have been given the opportunity to ask questions and make suggestions.  Aurora Mask, RN 03/06/2018, 6:47 PM

## 2018-03-07 DIAGNOSIS — Z5689 Other problems related to employment: Secondary | ICD-10-CM

## 2018-03-07 DIAGNOSIS — R45851 Suicidal ideations: Secondary | ICD-10-CM

## 2018-03-07 DIAGNOSIS — G4700 Insomnia, unspecified: Secondary | ICD-10-CM

## 2018-03-07 DIAGNOSIS — F1020 Alcohol dependence, uncomplicated: Secondary | ICD-10-CM | POA: Diagnosis present

## 2018-03-07 DIAGNOSIS — F322 Major depressive disorder, single episode, severe without psychotic features: Principal | ICD-10-CM

## 2018-03-07 DIAGNOSIS — F419 Anxiety disorder, unspecified: Secondary | ICD-10-CM

## 2018-03-07 DIAGNOSIS — Z62810 Personal history of physical and sexual abuse in childhood: Secondary | ICD-10-CM

## 2018-03-07 DIAGNOSIS — Z62811 Personal history of psychological abuse in childhood: Secondary | ICD-10-CM

## 2018-03-07 LAB — URINE CULTURE
CULTURE: NO GROWTH
Special Requests: NORMAL

## 2018-03-07 MED ORDER — FLUOXETINE HCL 10 MG PO CAPS
10.0000 mg | ORAL_CAPSULE | Freq: Every day | ORAL | Status: DC
  Administered 2018-03-08 – 2018-03-09 (×2): 10 mg via ORAL
  Filled 2018-03-07: qty 1
  Filled 2018-03-07: qty 7
  Filled 2018-03-07 (×3): qty 1

## 2018-03-07 MED ORDER — MIRTAZAPINE 7.5 MG PO TABS
7.5000 mg | ORAL_TABLET | Freq: Every day | ORAL | Status: DC
  Filled 2018-03-07 (×2): qty 1

## 2018-03-07 MED ORDER — VENLAFAXINE HCL ER 37.5 MG PO CP24
37.5000 mg | ORAL_CAPSULE | Freq: Every day | ORAL | Status: DC
  Administered 2018-03-07: 37.5 mg via ORAL
  Filled 2018-03-07 (×4): qty 1
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
## 2018-03-07 NOTE — [facility] Suicide Risk Assessment (Signed)
[facility] INPATIENT:  Family/Significant Other Suicide Prevention Education  Suicide Prevention Education:  Patient Refusal for Family/Significant Other Suicide Prevention Education: The patient Victoria Travis has refused to provide written consent for family/significant other to be provided Family/Significant Other Suicide Prevention Education during admission and/or prior to discharge.  Physician notified.  SPE completed with patient, as patient refused to consent to family contact. SPI pamphlet provided to pt and pt was encouraged to share information with support network, ask questions, and talk about any concerns relating to SPE. Patient denies access to guns/firearms and verbalized understanding of information provided. Mobile Crisis information also provided to patient.    Victoria Travis 03/07/2018, 9:56 AM

## 2018-03-07 NOTE — [facility] Group Notes (Signed)
Adult Psychoeducational Group Note  Date:  03/07/2018 Time:  4:48 PM  Group Topic/Focus:  Crisis Planning:   The purpose of this group is to help patients create a crisis plan for use upon discharge or in the future, as needed.  Participation Level:  Active  Participation Quality:  Attentive  Affect:  Appropriate  Cognitive:  Appropriate  Insight: Appropriate  Engagement in Group:  Developing/Improving  Modes of Intervention:  Clarification  Additional Comments:    Donell Beers 03/07/2018, 4:48 PM

## 2018-03-07 NOTE — Progress Notes (Signed)
D: Patient has flat, blunted affect.  She is ambivalent regarding her treatment.  She is not sleeping well; her appetite is poor.  Patient's energy level is low and concentration if good.  She denies any thoughts of self harm.  She rates her depression and anxiety as a 7; denies any hopelessness.  Her main goal is to work on her "nutrition."  Patient has poor insight regarding her alcohol abuse.  She is also asking when she can leave.  A: Continue to monitor medication management and MD orders.  Safety checks completed every 15 minutes per protocol.  Offer support and encouragement as needed.    R: Patient is receptive to staff; her behavior is appropriate.
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
## 2018-03-07 NOTE — Progress Notes (Signed)
NUTRITION ASSESSMENT  Pt identified as at risk on the Malnutrition Screen Tool  INTERVENTION: 1. Educated patient on the importance of nutrition and encouraged intake of food and beverages. 2. Discussed weight goals. 3. Supplements: continue Ensure Enlive BID, each supplement provides 350 kcal and 20 grams of protein.  NUTRITION DIAGNOSIS: Unintentional weight loss related to sub-optimal intake as evidenced by pt report.   Goal: Pt to meet >/= 90% of their estimated nutrition needs.  Monitor:  PO intake  Assessment:  Pt admitted for SI and an instance of self harm. She has hx of IBS and reports being pre-menopausal and recent issues with UTI. She drinks 3 vodkas/day and once a week has 6+ drinks in a day. She reports poor sleep and eating habits , especially recently.   Per chart review, pt has lost 9 lbs (6% body weight) in the past 6 months; this is not significant for time frame. Ensure Enlive ordered BID per ONS protocol at the time of admission. Pt accepting supplement at this time. Continue to encourage PO intakes of meals, supplements, and snacks.    52 y.o. female  Height: Ht Readings from Last 1 Encounters:  03/06/18  (1.676 m)    Weight: Wt Readings from Last 1 Encounters:  03/06/18 145 lb (65.8 kg)    Weight Hx: Wt Readings from Last 10 Encounters:  03/06/18 145 lb (65.8 kg)  03/06/18 145 lb (65.8 kg)  02/26/18 145 lb 12.8 oz (66.1 kg)  12/14/17 148 lb 12.8 oz (67.5 kg)  09/14/17 150 lb 9.6 oz (68.3 kg)  09/04/17 154 lb (69.9 kg)  09/04/17 154 lb 3.2 oz (69.9 kg)  02/13/17 161 lb 12.8 oz (73.4 kg)  02/08/17 158 lb (71.7 kg)  02/08/17 158 lb (71.7 kg)    BMI:  Body mass index is 23.4 kg/m. Pt meets criteria for normal weight based on current BMI.  Estimated Nutritional Needs: Kcal: 25-30 kcal/kg Protein: > 1 gram protein/kg Fluid: 1 ml/kcal  Diet Order:  Diet Order           Diet regular Room service appropriate? Yes; Fluid consistency:  Thin  Diet effective now         Pt is also offered choice of unit snacks mid-morning and mid-afternoon.  Pt is eating as desired.   Lab results and medications reviewed.      Trenton Gammon, MS, RD, LDN, [facility] Inpatient Clinical Dietitian Pager # [phone] After hours/weekend pager # [phone]
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
## 2018-03-07 NOTE — [facility] Group Notes (Signed)
LCSW Group Therapy Note  03/07/2018 1:15pm  Type of Therapy/Topic:  Group Therapy:  Feelings about Diagnosis  Participation Level:  None--pt pulled out of group to see MD/excused.    Description of Group:   This group will allow patients to explore their thoughts and feelings about diagnoses they have received. Patients will be guided to explore their level of understanding and acceptance of these diagnoses. Facilitator will encourage patients to process their thoughts and feelings about the reactions of others to their diagnosis and will guide patients in identifying ways to discuss their diagnosis with significant others in their lives. This group will be process-oriented, with patients participating in exploration of their own experiences, giving and receiving support, and processing challenge from other group members.   Therapeutic Goals: 1. Patient will demonstrate understanding of diagnosis as evidenced by identifying two or more symptoms of the disorder 2. Patient will be able to express two feelings regarding the diagnosis 3. Patient will demonstrate their ability to communicate their needs through discussion and/or role play  Summary of Patient Progress:    x   Therapeutic Modalities:   Cognitive Behavioral Therapy Brief Therapy Feelings Identification    Rona Ravens, LCSW 03/07/2018 10:58 AM

## 2018-03-07 NOTE — Progress Notes (Signed)
Spoke with patient's husband.  He is on the consent list.  He expressed his concern over the patient's drinking.  He states that she is hiding and sneaking it.  He states that she wasn't in any shape to go to work the other day.  She has been complaining of feeling tired and having pain.  Encouraged her husband to reach out to Child psychotherapist regarding long term treatment facilities.

## 2018-03-07 NOTE — Telephone Encounter (Signed)
Hold for now as she has been admitted with psychiatric diagnosis.  They will likely start medications for the patient.

## 2018-03-07 NOTE — Progress Notes (Signed)
Pt invited but declined wrap-up group.

## 2018-03-08 DIAGNOSIS — Z87891 Personal history of nicotine dependence: Secondary | ICD-10-CM

## 2018-03-08 DIAGNOSIS — E039 Hypothyroidism, unspecified: Secondary | ICD-10-CM

## 2018-03-08 LAB — TSH: TSH: 1.102 u[IU]/mL (ref 0.350–4.500)

## 2018-03-08 MED ORDER — TRAZODONE HCL 50 MG PO TABS
50.0000 mg | ORAL_TABLET | Freq: Every evening | ORAL | Status: DC | PRN
  Administered 2018-03-09: 50 mg via ORAL
  Filled 2018-03-08: qty 1
  Filled 2018-03-08: qty 7

## 2018-03-08 NOTE — Progress Notes (Signed)
D: Patient continues to be focused on discharge.  She denies any depressive symptoms or thoughts of self harm.  Her appearance has improved, as her facial features do not appear as puffy.  She has been visible in the milieu.  Patient complained of some dizziness this morning, however, she states she feels better now.  She continues to report poor sleep; her appetite remains fair; her energy level is normal and her concentration is good.  She is not interested in any long term treatment for alcohol abuse.  Patient states she needs to get back to work on Monday.  A: Continue to monitor medication management and MD orders.  Safety checks continued every 15 minutes per protocol.  Offer support and encouragement as needed.  R: Patient is receptive to staff; her behavior is appropriate.

## 2018-03-08 NOTE — [facility] Group Notes (Signed)
[facility] Group Notes:  (Nursing/MHT/Case Management/Adjunct)  Date:  03/08/2018  Time:  5:32 PM Type of Therapy:  Psychoeducational Skills  Participation Level:  Did Not Attend  Participation Quality:  DID NOT ATTEND  Affect:  DID NOT ATTEND  Cognitive:  DID NOT ATTEND  Insight:  None  Engagement in Group:  DID NOT ATTEND  Modes of Intervention:  DID NOT ATTEND  Summary of Progress/Problems: Pt did not attend patient self inventory group.   Bethann PunchesJane O Jymir Dunaj 03/08/2018, 5:32 PM

## 2018-03-08 NOTE — Plan of Care (Signed)
  Problem: Education: Goal: Emotional status will improve Outcome: Progressing Goal: Mental status will improve Outcome: Progressing   Problem: Coping: Goal: Ability to demonstrate self-control will improve Outcome: Progressing   Problem: Education: Goal: Utilization of techniques to improve thought processes will improve Outcome: Progressing

## 2018-03-08 NOTE — Progress Notes (Addendum)
[facility] MD Progress Note  03/08/2018 11:13 AM Victoria Travis  MRN:  [id number]   Subjective:  Patient reports that she is doing fine today. She denies anxiety, depression, SI/HI/AVH and contracts for safety. She keeps making comments about being better off at home. She denies any history of high blood pressure. She reports that she had very disrupted sleep last night. She reports some light headedness this morning and she feels it is because of the Prozac.   Objective: Patient's chart and findings reviewed and discussed with treatment team. Patient presents walking down the hall. She has been up and active, but I have not seen her interacting with others. She has a flat and depressed appearance. She is still more concerned with work than getting the help she needs. She is informed of the process of alcohol detox. She agrees to continue treatment. Will continue to monitor patient for dizziness and lightheadedness. Due to poor sleep will start Trazodone 50 mg QHS PRN. Her BP was elevated and had the RN check the BP manually and it was 130/75. Will continue all other medications at this time. Patient's labs were reviewed and TSH is WNL.   Principal Problem: MDD (major depressive disorder), severe (HCC) Diagnosis:   Patient Active Problem List   Diagnosis Date Noted  . Alcohol dependence (HCC) [F10.20] 03/07/2018  . MDD (major depressive disorder), severe (HCC) [F32.2] 03/06/2018  . 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 Cellulitis of foot [L03.119] 06/11/2012   Total Time spent with patient: 30 minutes  Past Psychiatric History: See H&P  Past Medical History:  Past Medical History:  Diagnosis Date  . Thyroid disease    History reviewed. No pertinent surgical history. Family History: History reviewed. No pertinent family history. Family Psychiatric  History: See H&P Social History:  Social History   Substance and Sexual Activity  Alcohol Use Yes  . Alcohol/week: 2.4 oz  . Types:  4 Standard drinks or equivalent per week   Comment: 1/2 gallon vodka every 9 days - drinks daily - 4 large drinks     Social History   Substance and Sexual Activity  Drug Use No    Social History   Socioeconomic History  . Marital status: Married    Spouse name: Not on file  . Number of children: Not on file  . Years of education: Not on file  . Highest education level: Not on file  Occupational History  . Not on file  Social Needs  . Financial resource strain: Not on file  . Food insecurity:    Worry: Not on file    Inability: Not on file  . Transportation needs:    Medical: Not on file    Non-medical: Not on file  Tobacco Use  . Smoking status: Former Games developer  . Smokeless tobacco: Never Used  Substance and Sexual Activity  . Alcohol use: Yes    Alcohol/week: 2.4 oz    Types: 4 Standard drinks or equivalent per week    Comment: 1/2 gallon vodka every 9 days - drinks daily - 4 large drinks  . Drug use: No  . Sexual activity: Yes  Lifestyle  . Physical activity:    Days per week: Not on file    Minutes per session: Not on file  . Stress: Not on file  Relationships  . Social connections:    Talks on phone: Not on file    Gets together: Not on file    Attends religious service: Not on file  Active member of club or organization: Not on file    Attends meetings of clubs or organizations: Not on file    Relationship status: Not on file  Other Topics Concern  . Not on file  Social History Narrative  . Not on file   Additional Social History:    Pain Medications: see MAR Prescriptions: see MAR Over the Counter: see MAR History of alcohol / drug use?: Yes Longest period of sobriety (when/how long): 3-4 days Name of Substance 1: vodka, beer 1 - Age of First Use: 20 1 - Amount (size/oz): 3 drinks of vodka 1 - Frequency: every day 1 - Duration: years 1 - Last Use / Amount: 03/05/18                  Sleep: Fair  Appetite:  Good  Current  Medications: Current Facility-Administered Medications  Medication Dose Route Frequency Provider Last Rate Last Dose  . feeding supplement (ENSURE ENLIVE) (ENSURE ENLIVE) liquid 237 mL  237 mL Oral BID BM Nira Conn A, NP   237 mL at 03/08/18 1016  . FLUoxetine (PROZAC) capsule 10 mg  10 mg Oral Daily Courtnay Petrilla, Rockey Situ, MD   10 mg at 03/08/18 0750  . folic acid (FOLVITE) tablet 1 mg  1 mg Oral Daily Rankin, Shuvon B, NP   1 mg at 03/08/18 0750  . hydrOXYzine (ATARAX/VISTARIL) tablet 25 mg  25 mg Oral Q6H PRN Nira Conn A, NP      . levothyroxine (SYNTHROID, LEVOTHROID) tablet 100 mcg  100 mcg Oral QAC breakfast Rankin, Shuvon B, NP   100 mcg at 03/08/18 0622  . loperamide (IMODIUM) capsule 2-4 mg  2-4 mg Oral PRN Nira Conn A, NP      . LORazepam (ATIVAN) tablet 1 mg  1 mg Oral Q6H PRN Nira Conn A, NP      . LORazepam (ATIVAN) tablet 1 mg  1 mg Oral QID Nira Conn A, NP   1 mg at 03/08/18 0750   Followed by  . LORazepam (ATIVAN) tablet 1 mg  1 mg Oral TID Jackelyn Poling, NP       Followed by  . [START ON 03/09/2018] LORazepam (ATIVAN) tablet 1 mg  1 mg Oral BID Jackelyn Poling, NP       Followed by  . [START ON 03/11/2018] LORazepam (ATIVAN) tablet 1 mg  1 mg Oral Daily Nira Conn A, NP      . multivitamin with minerals tablet 1 tablet  1 tablet Oral Daily Rankin, Shuvon B, NP   1 tablet at 03/08/18 0750  . nitrofurantoin (macrocrystal-monohydrate) (MACROBID) capsule 100 mg  100 mg Oral BID Nira Conn A, NP   100 mg at 03/08/18 0750  . ondansetron (ZOFRAN-ODT) disintegrating tablet 4 mg  4 mg Oral Q6H PRN Nira Conn A, NP      . thiamine (VITAMIN B-1) tablet 100 mg  100 mg Oral Daily Rankin, Shuvon B, NP   100 mg at 03/08/18 0750   Or  . thiamine (B-1) injection 100 mg  100 mg Intravenous Daily Rankin, Shuvon B, NP      . traZODone (DESYREL) tablet 50 mg  50 mg Oral QHS PRN Money, Gerlene Burdock, FNP        Lab Results:  Results for orders placed or performed during the hospital  encounter of 03/06/18 (from the past 48 hour(s))  TSH     Status: None   Collection Time: 03/08/18  7:43 AM  Result Value Ref Range   TSH 1.102 0.350 - 4.500 uIU/mL    Comment: Performed by a 3rd Generation assay with a functional sensitivity of <=0.01 uIU/mL. Performed at [facility], [address], [place], [place] [address]     Blood Alcohol level:  Lab Results  Component Value Date   ETH 246 (H) 03/06/2018   ETH 376 (HH) 03/06/2018    Metabolic Disorder Labs: No results found for: HGBA1C, MPG No results found for: PROLACTIN Lab Results  Component Value Date   CHOL 217 (H) 09/13/2015   TRIG 73 09/13/2015   HDL 108 09/13/2015   CHOLHDL 2.0 09/13/2015   VLDL 15 09/13/2015   LDLCALC 94 09/13/2015    Physical Findings: AIMS:  , ,  ,  ,    CIWA:  CIWA-Ar Total: 2 COWS:     Musculoskeletal: Strength & Muscle Tone: within normal limits Gait & Station: normal Patient leans: N/A  Psychiatric Specialty Exam: Physical Exam  Nursing note and vitals reviewed. Constitutional: She is oriented to person, place, and time. She appears well-developed and well-nourished.  Cardiovascular: Normal rate.  Respiratory: Effort normal.  Musculoskeletal: Normal range of motion.  Neurological: She is alert and oriented to person, place, and time.  Skin: Skin is warm.    Review of Systems  Constitutional: Negative.   HENT: Negative.   Eyes: Negative.   Respiratory: Negative.   Cardiovascular: Negative.   Gastrointestinal: Negative.   Genitourinary: Negative.   Musculoskeletal: Negative.   Skin: Negative.   Neurological: Negative.   Endo/Heme/Allergies: Negative.   Psychiatric/Behavioral: Positive for substance abuse. Negative for hallucinations and suicidal ideas.    Blood pressure 130/75, pulse 91, temperature 98.1 F (36.7 C), temperature source Oral, resp. rate 16, height  (1.676 m), weight 65.8 kg (145 lb), last menstrual period 02/21/2018, SpO2 100  %.Body mass index is 23.4 kg/m.  General Appearance: Casual  Eye Contact:  Good  Speech:  Clear and Coherent  Volume:  Normal  Mood:  She denies any anxiety or depression but appears depressed  Affect:  Flat  Thought Process:  Goal Directed and Descriptions of Associations: Intact  Orientation:  Full (Time, Place, and Person)  Thought Content:  WDL  Suicidal Thoughts:  No  Homicidal Thoughts:  No  Memory:  Immediate;   Good Recent;   Good Remote;   Good  Judgement:  Fair  Insight:  Fair  Psychomotor Activity:  Normal  Concentration:  Concentration: Good and Attention Span: Good  Recall:  Good  Fund of Knowledge:  Good  Language:  Good  Akathisia:  No  Handed:  Right  AIMS (if indicated):     Assets:  Communication Skills Desire for Improvement Financial Resources/Insurance Housing Physical Health Social Support Transportation  ADL's:  Intact  Cognition:  WNL  Sleep:  Number of Hours: 4.5   Problems Addressed: MDD severe Alcohol dependence  Treatment Plan Summary: Daily contact with patient to assess and evaluate symptoms and progress in treatment, Medication management and Plan is to:  -Continue Prozac 20 mg PO Daily for mood stability -Start Trazodone 50 mg PO QHS PRN for insomnia -Continue Ativan Detox Protocol -Continue Synthroid 100 mcg PO Daily before breakfast for hypothyroidism -Encourage group therapy participation  Maryfrances Bunnell, FNP 03/08/2018, 11:13 AM    ..Agree with NP Progress Note

## 2018-03-08 NOTE — [facility] Group Notes (Signed)
LCSW Group Therapy Note 03/08/2018 2:07 PM  Type of Therapy and Topic: Group Therapy: Avoiding Self-Sabotaging and Enabling Behaviors  Participation Level: Active  Description of Group:  In this group, patients will learn how to identify obstacles, self-sabotaging and enabling behaviors, as well as: what are they, why do we do them and what needs these behaviors meet. Discuss unhealthy relationships and how to have positive healthy boundaries with those that sabotage and enable. Explore aspects of self-sabotage and enabling in yourself and how to limit these self-destructive behaviors in everyday life.  Therapeutic Goals: 1. Patient will identify one obstacle that relates to self-sabotage and enabling behaviors 2. Patient will identify one personal self-sabotaging or enabling behavior they did prior to admission 3. Patient will state a plan to change the above identified behavior 4. Patient will demonstrate ability to communicate their needs through discussion and/or role play.   Summary of Patient Progress:  Victoria Travis was engaged and participated throughout the group session. Victoria Travis reports that hiding her alcohol use from her coworkers, family and friends was her self sabotaging behavior. Victoria Travis admits that she was in denial and did not think she had a problem until recently. Victoria Travis reports that she plans to engage with AA at discharge in hopes to find a sponsor who could provide additional support within the community.    Therapeutic Modalities:  Cognitive Behavioral Therapy Person-Centered Therapy Motivational Interviewing   Baldo Daub LCSWA Clinical Social Worker

## 2018-03-08 NOTE — Tx Team (Signed)
Interdisciplinary Treatment and Diagnostic Plan Update  03/08/2018 Time of Session: [id number] Victoria Travis MRN: [id number]  Principal Diagnosis: MDD (major depressive disorder), severe (HCC)  Secondary Diagnoses: Principal Problem:   MDD (major depressive disorder), severe (HCC) Active Problems:   Alcohol dependence (HCC)   Current Medications:  Current Facility-Administered Medications  Medication Dose Route Frequency Provider Last Rate Last Dose  . feeding supplement (ENSURE ENLIVE) (ENSURE ENLIVE) liquid 237 mL  237 mL Oral BID BM Nira Conn A, NP   237 mL at 03/07/18 1506  . FLUoxetine (PROZAC) capsule 10 mg  10 mg Oral Daily Cobos, Rockey Situ, MD   10 mg at 03/08/18 0750  . folic acid (FOLVITE) tablet 1 mg  1 mg Oral Daily Rankin, Shuvon B, NP   1 mg at 03/08/18 0750  . hydrOXYzine (ATARAX/VISTARIL) tablet 25 mg  25 mg Oral Q6H PRN Nira Conn A, NP      . levothyroxine (SYNTHROID, LEVOTHROID) tablet 100 mcg  100 mcg Oral QAC breakfast Rankin, Shuvon B, NP   100 mcg at 03/08/18 0622  . loperamide (IMODIUM) capsule 2-4 mg  2-4 mg Oral PRN Nira Conn A, NP      . LORazepam (ATIVAN) tablet 1 mg  1 mg Oral Q6H PRN Nira Conn A, NP      . LORazepam (ATIVAN) tablet 1 mg  1 mg Oral QID Nira Conn A, NP   1 mg at 03/08/18 0750   Followed by  . LORazepam (ATIVAN) tablet 1 mg  1 mg Oral TID Jackelyn Poling, NP       Followed by  . [START ON 03/09/2018] LORazepam (ATIVAN) tablet 1 mg  1 mg Oral BID Jackelyn Poling, NP       Followed by  . [START ON 03/11/2018] LORazepam (ATIVAN) tablet 1 mg  1 mg Oral Daily Nira Conn A, NP      . multivitamin with minerals tablet 1 tablet  1 tablet Oral Daily Rankin, Shuvon B, NP   1 tablet at 03/08/18 0750  . nitrofurantoin (macrocrystal-monohydrate) (MACROBID) capsule 100 mg  100 mg Oral BID Nira Conn A, NP   100 mg at 03/08/18 0750  . ondansetron (ZOFRAN-ODT) disintegrating tablet 4 mg  4 mg Oral Q6H PRN Nira Conn A, NP      . thiamine  (VITAMIN B-1) tablet 100 mg  100 mg Oral Daily Rankin, Shuvon B, NP   100 mg at 03/08/18 0750   Or  . thiamine (B-1) injection 100 mg  100 mg Intravenous Daily Rankin, Shuvon B, NP       PTA Medications: Facility-Administered Medications Prior to Admission  Medication Dose Route Frequency Provider Last Rate Last Dose  . triamcinolone acetonide (KENALOG-40) injection 40 mg  40 mg Intramuscular Once Carmelina Dane, MD       Medications Prior to Admission  Medication Sig Dispense Refill Last Dose  . dicyclomine (BENTYL) 10 MG capsule Take 1 capsule (10 mg total) by mouth 4 (four) times daily -  before meals and at bedtime. 120 capsule 0 03/05/2018  . ibuprofen (ADVIL,MOTRIN) 200 MG tablet Take 200 mg by mouth every 6 (six) hours as needed for moderate pain.   Past Month at Unknown time  . levothyroxine (SYNTHROID, LEVOTHROID) 100 MCG tablet TAKE 1 TABLET BY MOUTH ONCE DAILY 90 tablet 0 03/05/2018  . Multiple Vitamin (MULTIVITAMIN) tablet Take 1 tablet by mouth daily.   03/05/2018  . nitrofurantoin, macrocrystal-monohydrate, (MACROBID) 100 MG capsule Take 1 capsule (  100 mg total) by mouth 2 (two) times daily. 14 capsule 0 03/06/2018 at Unknown time  . phenazopyridine (PYRIDIUM) 200 MG tablet Take 1 tablet (200 mg total) by mouth 3 (three) times daily as needed for pain. (Patient not taking: Reported on 03/07/2018) 10 tablet 0 Not Taking at Unknown time    Patient Stressors: Marital or family conflict Substance abuse  Patient Strengths: Ability for insight Capable of independent living MetallurgistCommunication skills Financial means General fund of knowledge  Treatment Modalities: Medication Management, Group therapy, Case management,  1 to 1 session with clinician, Psychoeducation, Recreational therapy.   Physician Treatment Plan for Primary Diagnosis: MDD (major depressive disorder), severe (HCC) Long Term Goal(s): Improvement in symptoms so as ready for discharge Improvement in symptoms so as  ready for discharge   Short Term Goals: Ability to identify changes in lifestyle to reduce recurrence of condition will improve Ability to verbalize feelings will improve Ability to disclose and discuss suicidal ideas Ability to maintain clinical measurements within normal limits will improve Compliance with prescribed medications will improve Ability to identify triggers associated with substance abuse/mental health issues will improve  Medication Management: Evaluate patient's response, side effects, and tolerance of medication regimen.  Therapeutic Interventions: 1 to 1 sessions, Unit Group sessions and Medication administration.  Evaluation of Outcomes: Progressing  Physician Treatment Plan for Secondary Diagnosis: Principal Problem:   MDD (major depressive disorder), severe (HCC) Active Problems:   Alcohol dependence (HCC)  Long Term Goal(s): Improvement in symptoms so as ready for discharge Improvement in symptoms so as ready for discharge   Short Term Goals: Ability to identify changes in lifestyle to reduce recurrence of condition will improve Ability to verbalize feelings will improve Ability to disclose and discuss suicidal ideas Ability to maintain clinical measurements within normal limits will improve Compliance with prescribed medications will improve Ability to identify triggers associated with substance abuse/mental health issues will improve     Medication Management: Evaluate patient's response, side effects, and tolerance of medication regimen.  Therapeutic Interventions: 1 to 1 sessions, Unit Group sessions and Medication administration.  Evaluation of Outcomes: Progressing   RN Treatment Plan for Primary Diagnosis: MDD (major depressive disorder), severe (HCC) Long Term Goal(s): Knowledge of disease and therapeutic regimen to maintain health will improve  Short Term Goals: Ability to remain free from injury will improve, Ability to disclose and discuss  suicidal ideas and Ability to identify and develop effective coping behaviors will improve  Medication Management: RN will administer medications as ordered by provider, will assess and evaluate patient's response and provide education to patient for prescribed medication. RN will report any adverse and/or side effects to prescribing provider.  Therapeutic Interventions: 1 on 1 counseling sessions, Psychoeducation, Medication administration, Evaluate responses to treatment, Monitor vital signs and CBGs as ordered, Perform/monitor CIWA, COWS, AIMS and Fall Risk screenings as ordered, Perform wound care treatments as ordered.  Evaluation of Outcomes: Progressing   LCSW Treatment Plan for Primary Diagnosis: MDD (major depressive disorder), severe (HCC) Long Term Goal(s): Safe transition to appropriate next level of care at discharge, Engage patient in therapeutic group addressing interpersonal concerns.  Short Term Goals: Engage patient in aftercare planning with referrals and resources, Facilitate patient progression through stages of change regarding substance use diagnoses and concerns and Identify triggers associated with mental health/substance abuse issues  Therapeutic Interventions: Assess for all discharge needs, 1 to 1 time with Social worker, Explore available resources and support systems, Assess for adequacy in community support network, Educate family  and significant other(s) on suicide prevention, Complete Psychosocial Assessment, Interpersonal group therapy.  Evaluation of Outcomes: Progressing   Progress in Treatment: Attending groups: Yes. Participating in groups: Yes. Taking medication as prescribed: Yes. Toleration medication: Yes. Family/Significant other contact made: SPE completed with pt; pt declined to consent to collateral contact.  Patient understands diagnosis: Yes. Discussing patient identified problems/goals with staff: Yes. Medical problems stabilized or  resolved: Yes. Denies suicidal/homicidal ideation: Yes. Issues/concerns per patient self-inventory: No. Other: n/a   New problem(s) identified: No, Describe:  n/a  New Short Term/Long Term Goal(s): detox, medication management for mood stabilization; elimination of SI thoughts; development of comprehensive mental wellness/sobriety plan.   Patient Goals:  "to have a good exit plan and to get ready to discharge home."   Discharge Plan or Barriers: CSW assessing--[facility] for medication management and therapy. [facility]AG pamphlet, Mobile Crisis information, and AA/NA information provided to patient for additional community support and resources.   Reason for Continuation of Hospitalization: Depression Medication stabilization Withdrawal symptoms  Estimated Length of Stay: Monday, 03/11/18  Attendees: Patient: Victoria Travis 03/08/2018 8:57 AM  Physician: Dr. Jama Flavors MD; Dr. Altamese Kirby MD 03/08/2018 8:57 AM  Nursing: Erskine Squibb RN; [place] RN 03/08/2018 8:57 AM  RN Care Manager:x 03/08/2018 8:57 AM  Social Worker: Corrie Mckusick LCSW 03/08/2018 8:57 AM  Recreational Therapist: x 03/08/2018 8:57 AM  Other: Armandina Stammer NP; Feliz Beam Money NP 03/08/2018 8:57 AM  Other:  03/08/2018 8:57 AM  Other: 03/08/2018 8:57 AM    Scribe for Treatment Team: Rona Ravens, LCSW 03/08/2018 8:57 AM

## 2018-03-08 NOTE — Progress Notes (Signed)
Recreation Therapy Notes  Date: 5.31.19 Time: 0930 Location: [address] Dayroom  Group Topic: Stress Management  Goal Area(s) Addresses:  Patient will verbalize importance of using healthy stress management.  Patient will identify positive emotions associated with healthy stress management.   Intervention: Stress Management  Activity :  Progressive Muscle Relaxation.  LRT lead group in progressive muscle relaxation.  Patients were to tense each muscle one at a time and then release the tension.  Patients were to follow along as LRT lead them through the activity.    Education:  Stress Management, Discharge Planning.   Education Outcome: Acknowledges edcuation/In group clarification offered/Needs additional education  Clinical Observations/Feedback:  Pt did not attend group.      Caroll RancherMarjette Kawika Bischoff, LRT/CTRS         Caroll RancherLindsay, Shonice Wrisley A 03/08/2018 11:28 AM

## 2018-03-09 DIAGNOSIS — Y908 Blood alcohol level of 240 mg/100 ml or more: Secondary | ICD-10-CM

## 2018-03-09 MED ORDER — ACAMPROSATE CALCIUM 333 MG PO TBEC
666.0000 mg | DELAYED_RELEASE_TABLET | Freq: Three times a day (TID) | ORAL | Status: DC
  Administered 2018-03-09 – 2018-03-10 (×3): 666 mg via ORAL
  Filled 2018-03-09 (×4): qty 2
  Filled 2018-03-09 (×3): qty 42
  Filled 2018-03-09 (×2): qty 2

## 2018-03-09 NOTE — Progress Notes (Signed)
Pt attended orientation/ goals group this morning. Pt said that her goal for the day is to exercise and regain activity of her weak muscles.

## 2018-03-09 NOTE — Plan of Care (Signed)
Patient is showing improvement in mood, sleep, and appetite. She is setting goals, and participating in plan of care. Patient denies symptoms of depression, hopelessness or anxiety.

## 2018-03-09 NOTE — [facility] Group Notes (Signed)
Identifying Needs   Date:  03/09/2018  Time:  3:34 PM  Type of Therapy:  Nurse Education  /  The group focuses on teaching patients how to identify their needs and then hwo to develop and use those skills  to get some of their needs met.  Participation Level:  Active  Participation Quality:  Attentive  Affect:  Blunted  Cognitive:  Oriented  Insight:  Appropriate  Engagement in Group:  Engaged  Modes of Intervention:  Education  Summary of Progress/Problems:  Victoria Travis 03/09/2018, 3:34 PM

## 2018-03-09 NOTE — Progress Notes (Signed)
Pt attend wrap up group. 

## 2018-03-09 NOTE — Progress Notes (Addendum)
[facility] MD Progress Note  03/09/2018 8:55 AM Victoria Travis  MRN:  [id number]   Subjective:  Patient reports she is doing "OK". Denies significant symptoms of WDL at this time. Denies suicidal ideations. Does not endorse medication side effects.   Objective:  I have reviewed chart notes and have met with patient. 52 year old married female, presented to ED with depression, anxiety,  suicidal ideations. History of alcohol use disorder, admission BAL 376. At this time describes improved mood and does not endorse significant neuro-vegetative symptoms.  Denies suicidal ideations, and presents future oriented, focusing on returning to work soon. She is not currently presenting with significant alcohol WDL symptoms- minimal distal tremors, no diaphoresis, no psychomotor agitation, decreased facial flushing, vitals stable. She acknowledges having alcohol " problem", but describes feeling of annoyance, irritation when family members bring it up as a major concern. Using motivational interviewing, non confrontational style , we have reviewed the importance of further treatment after discharge and of abstinence as treatment goal. She does express some interest in attending [facility] IOP and go to [employer] . Agrees to Campral trial- side effects reviewed . TSH WNL.     Principal Problem: MDD (major depressive disorder), severe ([place]) Diagnosis:   Patient Active Problem List   Diagnosis Date Noted  . Alcohol dependence ([place]) [F10.20] 03/07/2018  . MDD (major depressive disorder), severe ([place]) [F32.2] 03/06/2018  . 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 Cellulitis of foot [L03.119] 06/11/2012   Total Time spent with patient: 20 minutes  Past Psychiatric History: See H&P  Past Medical History:  Past Medical History:  Diagnosis Date  . Thyroid disease    History reviewed. No pertinent surgical history. Family History: History reviewed. No pertinent family history. Family Psychiatric  History:  See H&P Social History:  Social History   Substance and Sexual Activity  Alcohol Use Yes  . Alcohol/week: 2.4 oz  . Types: 4 Standard drinks or equivalent per week   Comment: 1/2 gallon vodka every 9 days - drinks daily - 4 large drinks     Social History   Substance and Sexual Activity  Drug Use No    Social History   Socioeconomic History  . Marital status: Married    Spouse name: Not on file  . Number of children: Not on file  . Years of education: Not on file  . Highest education level: Not on file  Occupational History  . Not on file  Social Needs  . Financial resource strain: Not on file  . Food insecurity:    Worry: Not on file    Inability: Not on file  . Transportation needs:    Medical: Not on file    Non-medical: Not on file  Tobacco Use  . Smoking status: Former Research scientist (life sciences)  . Smokeless tobacco: Never Used  Substance and Sexual Activity  . Alcohol use: Yes    Alcohol/week: 2.4 oz    Types: 4 Standard drinks or equivalent per week    Comment: 1/2 gallon vodka every 9 days - drinks daily - 4 large drinks  . Drug use: No  . Sexual activity: Yes  Lifestyle  . Physical activity:    Days per week: Not on file    Minutes per session: Not on file  . Stress: Not on file  Relationships  . Social connections:    Talks on phone: Not on file    Gets together: Not on file    Attends religious service: Not on file  Active member of club or organization: Not on file    Attends meetings of clubs or organizations: Not on file    Relationship status: Not on file  Other Topics Concern  . Not on file  Social History Narrative  . Not on file   Additional Social History:    Pain Medications: see MAR Prescriptions: see MAR Over the Counter: see MAR History of alcohol / drug use?: Yes Longest period of sobriety (when/how long): 3-4 days Name of Substance 1: vodka, beer 1 - Age of First Use: 20 1 - Amount (size/oz): 3 drinks of vodka 1 - Frequency: every day 1 -  Duration: years 1 - Last Use / Amount: 03/05/18  Sleep: Good  Appetite:  Good  Current Medications: Current Facility-Administered Medications  Medication Dose Route Frequency Provider Last Rate Last Dose  . acamprosate (CAMPRAL) tablet 666 mg  666 mg Oral TID WC Cobos, Fernando A, MD      . feeding supplement (ENSURE ENLIVE) (ENSURE ENLIVE) liquid 237 mL  237 mL Oral BID BM Lindon Romp A, NP   237 mL at 03/08/18 1649  . FLUoxetine (PROZAC) capsule 10 mg  10 mg Oral Daily Cobos, Myer Peer, MD   10 mg at 03/09/18 0821  . folic acid (FOLVITE) tablet 1 mg  1 mg Oral Daily Rankin, Shuvon B, NP   1 mg at 03/09/18 6283  . hydrOXYzine (ATARAX/VISTARIL) tablet 25 mg  25 mg Oral Q6H PRN Lindon Romp A, NP      . levothyroxine (SYNTHROID, LEVOTHROID) tablet 100 mcg  100 mcg Oral QAC breakfast Rankin, Shuvon B, NP   100 mcg at 03/09/18 0631  . loperamide (IMODIUM) capsule 2-4 mg  2-4 mg Oral PRN Lindon Romp A, NP      . LORazepam (ATIVAN) tablet 1 mg  1 mg Oral Q6H PRN Lindon Romp A, NP      . LORazepam (ATIVAN) tablet 1 mg  1 mg Oral BID Rozetta Nunnery, NP       Followed by  . [START ON 03/11/2018] LORazepam (ATIVAN) tablet 1 mg  1 mg Oral Daily Lindon Romp A, NP      . multivitamin with minerals tablet 1 tablet  1 tablet Oral Daily Rankin, Shuvon B, NP   1 tablet at 03/09/18 0821  . nitrofurantoin (macrocrystal-monohydrate) (MACROBID) capsule 100 mg  100 mg Oral BID Lindon Romp A, NP   100 mg at 03/09/18 0821  . ondansetron (ZOFRAN-ODT) disintegrating tablet 4 mg  4 mg Oral Q6H PRN Lindon Romp A, NP      . thiamine (VITAMIN B-1) tablet 100 mg  100 mg Oral Daily Rankin, Shuvon B, NP   100 mg at 03/09/18 6629   Or  . thiamine (B-1) injection 100 mg  100 mg Intravenous Daily Rankin, Shuvon B, NP      . traZODone (DESYREL) tablet 50 mg  50 mg Oral QHS PRN Money, Lowry Ram, FNP        Lab Results:  Results for orders placed or performed during the hospital encounter of 03/06/18 (from the past 48  hour(s))  TSH     Status: None   Collection Time: 03/08/18  7:43 AM  Result Value Ref Range   TSH 1.102 0.350 - 4.500 uIU/mL    Comment: Performed by a 3rd Generation assay with a functional sensitivity of <=0.01 uIU/mL. Performed at [facility], [place] [address] [place], [place] [address]     Blood Alcohol  level:  Lab Results  Component Value Date   ETH 246 (H) 03/06/2018   ETH 376 (HH) 97/98/9211    Metabolic Disorder Labs: No results found for: HGBA1C, MPG No results found for: PROLACTIN Lab Results  Component Value Date   CHOL 217 (H) 09/13/2015   TRIG 73 09/13/2015   HDL 108 09/13/2015   CHOLHDL 2.0 09/13/2015   VLDL 15 09/13/2015   LDLCALC 94 09/13/2015    Physical Findings: AIMS:  , ,  ,  ,    CIWA:  CIWA-Ar Total: 2 COWS:     Musculoskeletal: Strength & Muscle Tone: within normal limits Gait & Station: normal Patient leans: N/A  Psychiatric Specialty Exam: Physical Exam  Nursing note and vitals reviewed. Constitutional: She is oriented to person, place, and time. She appears well-developed and well-nourished.  Cardiovascular: Normal rate.  Respiratory: Effort normal.  Musculoskeletal: Normal range of motion.  Neurological: She is alert and oriented to person, place, and time.  Skin: Skin is warm.    Review of Systems  Constitutional: Negative.   HENT: Negative.   Eyes: Negative.   Respiratory: Negative.   Cardiovascular: Negative.   Gastrointestinal: Negative.   Genitourinary: Negative.   Musculoskeletal: Negative.   Skin: Negative.   Neurological: Negative.   Endo/Heme/Allergies: Negative.   Psychiatric/Behavioral: Positive for substance abuse. Negative for hallucinations and suicidal ideas.  no chest pain, no shortness of breath, no vomiting   Blood pressure 117/81, pulse 89, temperature (!) 97.5 F (36.4 C), temperature source Oral, resp. rate 16, height _0  (1.676 m), weight 65.8 kg (145 lb), last menstrual period  02/21/2018, SpO2 99 %.Body mass index is 23.4 kg/m.  General Appearance: Casual  Eye Contact:  Good  Speech:  Normal Rate  Volume:  Normal  Mood:  denies depression  Affect:  vaguely guarded and anxious, improves during session  Thought Process:  Linear and Descriptions of Associations: Intact  Orientation:  Full (Time, Place, and Person)  Thought Content:  no hallucinations, no delusions, not internally preoccupied   Suicidal Thoughts:  No denies SI or self injurious ideations   Homicidal Thoughts:  No  Memory:  recent and remote grossly intact   Judgement:  Other:  improving   Insight:  Fair and improving   Psychomotor Activity:  Normal- no current psychomotor restlessness or distal tremors   Concentration:  Concentration: Good and Attention Span: Good  Recall:  Good  Fund of Knowledge:  Good  Language:  Good  Akathisia:  No  Handed:  Right  AIMS (if indicated):     Assets:  Communication Skills Desire for Improvement Financial Resources/Insurance Housing Physical Health Social Support Transportation  ADL's:  Intact  Cognition:  WNL  Sleep:  Number of Hours: 6.75   Assessment- patient reports improved mood , affect is becoming more reactive, and denies any SI/presents future oriented . Vaguely guarded, irritable when discussing alcohol related issues, but gaining insight into alcohol use disorder and importance of ongoing treatment after discharge/beyond detoxification management. Currently not presenting with significant  ETOH WDL symptoms. No medication side effects, agrees to Campral trial.  Treatment Plan Summary: Daily contact with patient to assess and evaluate symptoms and progress in treatment, Medication management and Plan is to:  -Treatment plan reviewed as below today 6/1 -Encourage group and milieu participation to work on coping skills and symptom reduction -Encourage ongoing efforts and motivation to work on sobriety and relapse prevention -Start Campral 666  mgrs TID for alcohol use disorder  -Continue  Prozac 10 mgDaily for mood stability -Continue  Trazodone 50 mg QHS PRN for insomnia -Continue Ativan Detox Protocol -Continue Synthroid 100 mcg QAM for hypothyroidism -Treatment team working on disposition planning options  Jenne Campus, MD 03/09/2018, 8:55 AM  Patient ID: Victoria Travis, female   DOB: 05-26-1966, 52 y.o.   MRN: [id number]

## 2018-03-09 NOTE — Progress Notes (Signed)
Patient did attend the evening speaker AA meeting.  

## 2018-03-09 NOTE — Progress Notes (Signed)
D: Patient presents bright, positive. She says she "feels good" and during assessment stared frequently with wide-eyes. She slept well last night and received no PRN sleep medication. Appetite is good, energy is high, and concentration is good. Depression, hopelessness, and anxiety are rated 0/10. Denies any withdrawal symptoms at this time. CIWA scored 0. Denies SI, HI or AVH.   A: Patient checked q15 min, and checks reviewed. Reviewed medication changes with patient and educated on side effects. Educated patient on importance of attending group therapy sessions and educated on several coping skills. Encouarged participation in milieu through recreation and attending meals with peers.  R: Patient receptive to education on medications, and is medication compliant. Patient attending all group therapy sessions and cafeteria with peers. Patient contracts for safety on the unit. Goal: "Increase muscle strength, positive motivation" and to meet that "workout/read the book."

## 2018-03-09 NOTE — [facility] Group Notes (Signed)
LCSW Group Therapy Note  03/09/2018   10:00--11:00am   Type of Therapy and Topic:  Group Therapy: Anger Cues and Responses  Participation Level:  Active   Description of Group:   In this group, patients learned how to recognize the physical, cognitive, emotional, and behavioral responses they have to anger-provoking situations.  They identified a recent time they became angry and how they reacted.  They analyzed how their reaction was possibly beneficial and how it was possibly unhelpful.  The group discussed a variety of healthier coping skills that could help with such a situation in the future.   Therapeutic Goals: 1. Patients will remember their last incident of anger and how they felt emotionally and physically, what their thoughts were at the time, and how they behaved. 2. Patients will identify how their behavior at that time worked for them, as well as how it worked against them. 3. Patients will explore possible new behaviors to use in future anger situations. 4. Patients will learn that anger itself is normal and cannot be eliminated, and that healthier reactions can assist with resolving conflict rather than worsening situations.  Summary of Patient Progress:  The patient shared that her most recent time of anger was when she felt that her spouse is not helping with keeping the housework done and said she has a very stressful job and with her hypothyroidism is always tired, so that makes her both more prone to anger and less able to help with the housework.  She talked about various healthy ways in which she addresses the issues and gave suggestions to another group member.  Therapeutic Modalities:   Cognitive Behavioral Therapy  Lynnell ChadMareida J Grossman-Orr

## 2018-03-09 NOTE — B[facility] Group Notes (Signed)
Recreation Activity  Date:  03/09/2018  Time:  6:03 PM  Type of Therapy:  BINGO :  The purpose of the group is to create a forum where patients can experience laughter in a benign Bingo game.  Participation Level:  Did Not Attend  Participation Quality:    Affect:    Cognitive:    Insight:    Engagement in Group:    Modes of Intervention:    Summary of Progress/Problems:  Rich BraveDuke, Kendi Defalco Lynn 03/09/2018, 6:03 PM

## 2018-03-09 NOTE — Progress Notes (Signed)
Patient ID: Victoria DecampKaren Sia, female   DOB: 1966-01-21, 52 y.o.   MRN: [id number]   D: Patient pleasant on approach tonight. Reports that she knows that she has got a problem with alcohol but felt irritated that her mother and sister have been trying to lecture and dictate what they want her to do. Patient knows they care but feels frustrated. She reported that mother visited even though she didn't want her to come here. Denies any SI and contracts for safety. CIWA= 2 earlier in shift. A: Staff will monitor on q 15 minute checks, follow treatment plan, and give medications as ordered. R: Cooperative on the unit.

## 2018-03-10 DIAGNOSIS — X789XXA Intentional self-harm by unspecified sharp object, initial encounter: Secondary | ICD-10-CM

## 2018-03-10 DIAGNOSIS — T1491XA Suicide attempt, initial encounter: Secondary | ICD-10-CM

## 2018-03-10 MED ORDER — TRAZODONE HCL 50 MG PO TABS: 50.0000 mg | ORAL_TABLET | Freq: Every evening | ORAL | 0 refills | Status: DC | PRN

## 2018-03-10 MED ORDER — FLUOXETINE HCL 10 MG PO CAPS: 10.0000 mg | ORAL_CAPSULE | Freq: Every day | ORAL | 0 refills | Status: DC

## 2018-03-10 MED ORDER — LEVOTHYROXINE SODIUM 100 MCG PO TABS: 100.0000 ug | ORAL_TABLET | Freq: Every day | ORAL | 0 refills | Status: DC

## 2018-03-10 MED ORDER — ACAMPROSATE CALCIUM 333 MG PO TBEC: 666.0000 mg | DELAYED_RELEASE_TABLET | Freq: Three times a day (TID) | ORAL | 0 refills | Status: DC

## 2018-03-10 NOTE — [facility] Group Notes (Signed)
[facility] LCSW Group Therapy Note  Date/Time:  03/10/2018 9:00-10:00 or 10:00-11:00AM  Type of Therapy and Topic:  Group Therapy:  Healthy and Unhealthy Supports  Participation Level:  Active   Description of Group:  Patients in this group were introduced to the idea of adding a variety of healthy supports to address the various needs in their lives.Patients discussed what additional healthy supports could be helpful in their recovery and wellness after discharge in order to prevent future hospitalizations.   An emphasis was placed on using counselor, doctor, therapy groups, 12-step groups, and problem-specific support groups to expand supports.  They also worked as a group on developing a specific plan for several patients to deal with unhealthy supports through [place], psychoeducation with loved ones, and even termination of relationships.   Therapeutic Goals:   1)  discuss importance of adding supports to stay well once out of the hospital  2)  compare healthy versus unhealthy supports and identify some examples of each  3)  generate ideas and descriptions of healthy supports that can be added  4)  offer mutual support about how to address unhealthy supports  5)  encourage active participation in and adherence to discharge plan    Summary of Patient Progress:  The patient was present only for the first part of group, then discharged. She stated she has to focus on ensuring that her own personal needs are met and not just focusing on everyone else.   Therapeutic Modalities:   Motivational Interviewing Brief Solution-Focused Therapy  Selmer Dominion, LCSW

## 2018-03-10 NOTE — Plan of Care (Signed)
Patient self inventory: Patient slept well last night, sleep medication helped, appetite has been good, depression, hopelessness, and anxiety are all rated 0 out of 10. Denies SI/HI/AVH. Denies physical problems. Patient's goal is "stretching to prepare for triathalon and look for AA meetings." she will do this by "going for a bike ride once home."  Patient compliant with medications prescribed per physician. Safety maintained with 15 minute checks.  Problem: Education: Goal: Mental status will improve Outcome: Adequate for Discharge

## 2018-03-10 NOTE — Progress Notes (Signed)
  [facility]  Will you be returning to the same living situation after discharge:  Yes,  states she will return home At discharge, do you have transportation home?: Yes,  patient has arranged Do you have the ability to pay for your medications: Yes,  patient reports no barriers  Release of information consent forms completed and turned in to Medical Records by CSW.   Patient to Follow up at: Follow-up Information    [facility]. Go on 03/14/2018.   Specialty:  Behavioral Health Why:  Appointment is Thursday, 03/14/18 at 8:00am. Please be sure to bring your Photo ID, SSN(card) and any discharge paperwork from the hospital.  Contact information: [address] [place] [address] [phone]        [facility] Follow up on 03/13/2018.   Specialty:  Behavioral Health Why:  Therapy assessment of Wednesday, 6/5 at 2:00PM with Alimah. Please arrive no later than 1:45PM for intake and bring photo ID. Please call within 48 hours of appt to cancel or reschedule. Thank you. Contact information: [address] [place] [address] [phone]           Next level of care provider has access to [facility] Link:no  Safety Planning and Suicide Prevention discussed: Yes,  with patient only  Have you used any form of tobacco in the last 30 days? (Cigarettes, Smokeless Tobacco, Cigars, and/or Pipes): No  Has patient been referred to the Quitline?: N/A patient is not a smoker  Patient has been referred for addiction treatment: Yes  Lynnell ChadMareida J Grossman-Orr, LCSW 03/10/2018, 8:20 AM
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
## 2018-03-10 NOTE — Progress Notes (Signed)
Discharge note: Patient reviewed discharge paperwork with RN including prescriptions, follow up appointments, and lab work. Patient given the opportunity to ask questions. All concerns were addressed. All belongings were returned to patient. Denied SI/HI/AVH. Patient thanked staff for their care while at the hospital. Patient walked to lobby and discharged home with husband.

## 2018-03-10 NOTE — Plan of Care (Signed)
Victoria Travis reported that she has been sleeping well.  Trazodone given prn tonight because she wants to make sure she sleeps well tonight before going home.  We will continue to monitor the progress towards her goals.

## 2018-03-10 NOTE — Progress Notes (Signed)
D:  Victoria Travis has been up and visible on the unit.  She attended evening AA group.  She denies any complaints and appears to be in no physical distress.  She denies SI/HI or A/V hallucinations.  She denies any withdrawal symptoms.  She reported that she will be leaving tomorrow and was hoping to leave early in the morning.  Explained d/c process to her and encouraged her to wait to call her ride until her nurse let her know the paperwork will be ready.  A:  1:1 with RN for support and encouragement.  Medications as ordered.  Q 15 minute checks maintained for safety.  Encouraged participation in group and unit activities. R:  Victoria Travis remains safe on the unit.  We will continue to monitor the progress towards her goals.

## 2018-03-10 NOTE — BHH Suicide Risk Assessment (Signed)
[facility]   Principal Problem: MDD (major depressive disorder), severe [facility](HCC) Discharge Diagnoses:  Patient Active Problem List   Diagnosis Date Noted  . Alcohol dependence (HCC) [F10.20] 03/07/2018  . MDD (major depressive disorder), severe (HCC) [F32.2] 03/06/2018  . 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 Cellulitis of foot [L03.119] 06/11/2012    Total Time spent with patient: 30 minutes  Musculoskeletal: Strength & Muscle Tone: within normal limits  No tremors, no diaphoresis Gait & Station: normal Patient leans: N/A  Psychiatric Specialty Exam: ROS no headache, no chest pain, no shortness of breath, no vomiting , no rash   Blood pressure 117/82, pulse (!) 102, temperature 97.7 F (36.5 C), temperature source Oral, resp. rate 16, height 5\' 6"  (1.676 m), weight 65.8 kg (145 lb), last menstrual period 02/21/2018, SpO2 99 %.Body mass index is 23.4 kg/m.  General Appearance: Well Groomed  Eye Contact::  Good  Speech:  Normal Rate409  Volume:  Normal  Mood:  reports mood is " good", denies depression  Affect:  Appropriate  Thought Process:  Linear and Descriptions of Associations: Intact  Orientation:  Full (Time, Place, and Person)  Thought Content:  denies hallucinations, no delusions, not internally preoccupied   Suicidal Thoughts:  No denies any suicidal or self injurious ideations   Homicidal Thoughts:  No  Memory:  recent and remote grossly intact   Judgement:  Other:  improving   Insight:  improving   Psychomotor Activity:  Normal- no tremors, no diaphoresis, no restlessness   Concentration:  Good  Recall:  Good  Fund of Knowledge:Good  Language: Good  Akathisia:  Negative  Handed:  Right  AIMS (if indicated):     Assets:  Communication Skills Desire for Improvement Resilience  Sleep:  Number of Hours: 6.5  Cognition: WNL  ADL's:  Intact   Mental Status Per Nursing Assessment::   On Admission:  Self-harm  behaviors  Demographic Factors:  52 year old married female , employed   Loss Factors: Stressful job, increased alcohol consumption  Historical Factors: No prior psychiatric admissions, no history of suicide attempts, history of alcohol use disorder  Risk Reduction Factors:   Sense of responsibility to family, Employed, Living with another person, especially a relative and Positive coping skills or problem solving skills  Continued Clinical Symptoms:  Alert, attentive, well related, calm, mood improved , affect more reactive , no thought disorder, no suicidal or self injurious ideations, denies any homicidal or violent ideations, no psychotic symptoms, future oriented . Behavior on unit in good control. Denies medication side effects. Does not present with current alcohol withdrawal symptoms.   Cognitive Features That Contribute To Risk:  Closed-mindedness and Loss of executive function    Suicide Risk:  Mild   Follow-up Information    [facility]. Go on 03/14/2018.   Specialty:  Behavioral Health Why:  Appointment is Thursday, 03/14/18 at 8:00am. Please be sure to bring your Photo ID, SSN(card) and any discharge paperwork from the hospital.  Contact information: [address] [place] [address] [phone]        [facility] Follow up on 03/13/2018.   Specialty:  Behavioral Health Why:  Therapy assessment of Wednesday, 6/5 at 2:00PM with Alimah. Please arrive no later than 1:45PM for intake and bring photo ID. Please call within 48 hours of appt to cancel or reschedule. Thank you. Contact information: [address] [place] [address] [phone]  Plan Of Care/Follow-up recommendations:  Activity:  as tolerated  Diet:  regular Tests:  NA Other:  See below  Patient expressing readiness for discharge, leaving unit in good spirits  Plans to return home Plans to follow up as above , and expresses intention of  going to AA regularly    Craige Cotta, MD 03/10/2018, 7:35 AM

## 2018-04-04 ENCOUNTER — Encounter: Payer: Self-pay | Admitting: Family Medicine

## 2018-04-04 ENCOUNTER — Ambulatory Visit (INDEPENDENT_AMBULATORY_CARE_PROVIDER_SITE_OTHER): Payer: Self-pay | Admitting: Family Medicine

## 2018-04-04 VITALS — BP 128/86 | HR 91 | Temp 98.4°F | Resp 16 | Ht 65.0 in | Wt 144.6 lb

## 2018-04-04 DIAGNOSIS — N3001 Acute cystitis with hematuria: Secondary | ICD-10-CM

## 2018-04-04 DIAGNOSIS — R5383 Other fatigue: Secondary | ICD-10-CM

## 2018-04-04 DIAGNOSIS — K580 Irritable bowel syndrome with diarrhea: Secondary | ICD-10-CM

## 2018-04-04 DIAGNOSIS — F1011 Alcohol abuse, in remission: Secondary | ICD-10-CM

## 2018-04-04 DIAGNOSIS — R350 Frequency of micturition: Secondary | ICD-10-CM

## 2018-04-04 DIAGNOSIS — R1114 Bilious vomiting: Secondary | ICD-10-CM

## 2018-04-04 DIAGNOSIS — Z87898 Personal history of other specified conditions: Secondary | ICD-10-CM

## 2018-04-04 LAB — POCT URINALYSIS DIP (MANUAL ENTRY)
BILIRUBIN UA: NEGATIVE
GLUCOSE UA: NEGATIVE mg/dL
Leukocytes, UA: NEGATIVE
Nitrite, UA: NEGATIVE
Protein Ur, POC: 30 mg/dL — AB
Urobilinogen, UA: 0.2 E.U./dL
pH, UA: 5.5 (ref 5.0–8.0)

## 2018-04-04 LAB — POCT CBC
GRANULOCYTE PERCENT: 85.5 % — AB (ref 37–80)
HEMATOCRIT: 41.6 % (ref 37.7–47.9)
Hemoglobin: 12.9 g/dL (ref 12.2–16.2)
Lymph, poc: 0.9 (ref 0.6–3.4)
MCH, POC: 28.2 pg (ref 27–31.2)
MCHC: 31 g/dL — AB (ref 31.8–35.4)
MCV: 91.1 fL (ref 80–97)
MID (CBC): 0.5 (ref 0–0.9)
MPV: 7.3 fL (ref 0–99.8)
POC Granulocyte: 8.3 — AB (ref 2–6.9)
POC LYMPH %: 8.9 % — AB (ref 10–50)
POC MID %: 5.6 %M (ref 0–12)
Platelet Count, POC: 342 10*3/uL (ref 142–424)
RBC: 4.57 M/uL (ref 4.04–5.48)
RDW, POC: 20.4 %
WBC: 9.7 10*3/uL (ref 4.6–10.2)

## 2018-04-04 LAB — POC MICROSCOPIC URINALYSIS (UMFC): MUCUS RE: ABSENT

## 2018-04-04 MED ORDER — PHENAZOPYRIDINE HCL 100 MG PO TABS: 100.0000 mg | ORAL_TABLET | Freq: Three times a day (TID) | ORAL | 0 refills | Status: DC | PRN

## 2018-04-04 MED ORDER — ONDANSETRON HCL 4 MG/2ML IJ SOLN
4.0000 mg | Freq: Once | INTRAMUSCULAR | Status: AC
  Administered 2018-04-04: 4 mg via INTRAMUSCULAR

## 2018-04-04 MED ORDER — ONDANSETRON 4 MG PO TBDP: ORAL_TABLET | ORAL | 0 refills | Status: DC

## 2018-04-04 MED ORDER — SULFAMETHOXAZOLE-TRIMETHOPRIM 800-160 MG PO TABS: 1.0000 | ORAL_TABLET | Freq: Two times a day (BID) | ORAL | 0 refills | Status: DC

## 2018-04-04 NOTE — Progress Notes (Signed)
Patient ID: Victoria Travis, female    DOB: 08-Jan-1966  Age: 52 y.o. MRN: [id number]  Chief Complaint  Patient presents with  . urination frequency    x 2 days, vaginal area tingling x 2 days   . nausea    last 48 hours  . vomiting  . vaginal discharge    x 2 days    Subjective:   52 year old lady who has had dysuria for several days.  She has had a history of UTIs.  Since yesterday she has been vomiting.  She is not keeping anything on her stomach.  She has tried drinking some water but vomits bilious material back up.  She has history of IBS, and feels like she could have diarrhea.  She continues to have dysuria.  She attributes a lot of her dysuria and UTIs due to to the IBS and messing in her pants from time to time.  She has some mild lower abdominal pain.  She feels lousy.  She does have a history of alcoholism, was in the hospital for detox a few weeks ago.  She has been drinking some since then.  None today.  Current allergies, medications, problem list, past/family and social histories reviewed.  Objective:  BP 128/86 (BP Location: Left Arm, Patient Position: Sitting, Cuff Size: Normal)   Pulse 91   Temp 98.4 F (36.9 C) (Oral)   Resp 16   Ht 5\' 5"  (1.651 m)   Wt 144 lb 9.6 oz (65.6 kg)   LMP 03/23/2018   SpO2 98%   BMI 24.06 kg/m   Looks like she feels miserable.  She has 1 pan of yellow-green vomitus, and is retching over another pan.  Throat clear.  Mucous membranes moist.  Neck supple without nodes.  Chest clear.  Heart regular without murmur.  Abdomen soft without mass or tenderness.  But vomits every time I lay her down and palpate the abdomen.  Throat is normal.  Results for orders placed or performed in visit on 04/04/18  POCT urinalysis dipstick  Result Value Ref Range   Color, UA yellow yellow   Clarity, UA clear clear   Glucose, UA negative negative mg/dL   Bilirubin, UA negative negative   Ketones, POC UA >= (160) (A) negative mg/dL   Spec Grav, UA  >=4.098 (A) 1.010 - 1.025   Blood, UA moderate (A) negative   pH, UA 5.5 5.0 - 8.0   Protein Ur, POC =30 (A) negative mg/dL   Urobilinogen, UA 0.2 0.2 or 1.0 E.U./dL   Nitrite, UA Negative Negative   Leukocytes, UA Negative Negative  POCT Microscopic Urinalysis ([facility])  Result Value Ref Range   WBC,UR,HPF,POC Moderate (A) None WBC/hpf   RBC,UR,HPF,POC Moderate (A) None RBC/hpf   Bacteria Few (A) None, Too numerous to count   Mucus Absent Absent   Epithelial Cells, UR Per Microscopy Many (A) None, Too numerous to count cells/hpf     Assessment & Plan:   Assessment: 1. Acute cystitis with hematuria   2. Bilious vomiting with nausea   3. Urinary frequency   4. Irritable bowel syndrome with diarrhea   5. Fatigue, unspecified type   6. History of alcohol abuse       Plan: Instructions.  Discussed with patient.  Orders Placed This Encounter  Procedures  . Urine Culture  . Basic metabolic panel  . POCT urinalysis dipstick  . POCT Microscopic Urinalysis ([facility])  . POCT CBC    Meds ordered this encounter  Medications  .  ondansetron (ZOFRAN) injection 4 mg         Patient Instructions       IF you received an x-ray today, you will receive an invoice from [facility]. Please contact [facility] at [phone] with questions or concerns regarding your invoice.   IF you received labwork today, you will receive an invoice from [facility]. Please contact [facility] at [phone] with questions or concerns regarding your invoice.   Our billing staff will not be able to assist you with questions regarding bills from these companies.  You will be contacted with the lab results as soon as they are available. The fastest way to get your results is to activate your My Chart account. Instructions are located on the last page of this paperwork. If you have not heard from us regarding the results in 2 weeks, please contact this office.        No  follow-ups on file.   Janace Hoardavid Artist Bloom, MD 04/04/2018

## 2018-04-04 NOTE — Patient Instructions (Addendum)
Zofran 4 mg every 6 or 8 hours for nausea and vomiting  Once her stomach feels come down a little bit, begin sipping fluids.  Water, Gatorade, broth, Pedialyte, soft drinks that you have let sit out for port over ice to get rid of the fizz, etc.  Avoid dairy products and acid juices.  Gently advance her diet as tolerate d, avoiding rich and spicy foods and sticking with things that are pretty bland.  Take cephalexin (Keflex) 500 mg 3 times daily for infection.  Return or go to the emergency room if worse  I encourage you to continue to avoid the alcohol, be faithful with AA.  I imagine the alcoholism is more the cause of your fatigue than the hypothyroidism, because your thyroid appears to be well controlled.   Vomiting, Adult Vomiting occurs when stomach contents are thrown up and out of the mouth. Many people notice nausea before vomiting. Vomiting can make you feel weak and dehydrated. Dehydration can make you tired and thirsty, cause you to have a dry mouth, and decrease how often you urinate. Older adults and people who have other diseases or a weak immune system are at higher risk for dehydration.It is important to treat vomiting as told by your health care provider. Follow these instructions at home: Follow your health care provider's instructions about how to care for yourself at home. Eating and drinking Follow these recommendations as told by your health care provider:  Take an oral rehydration solution (ORS). This is a drink that is sold at pharmacies and retail stores.  Eat bland, easy-to-digest foods in small amounts as you are able. These foods include bananas, applesauce, rice, lean meats, toast, and crackers.  Drink clear fluids in small amounts as you are able. Clear fluids include water, ice chips, low-calorie sports drinks, and fruit juice that has water added (diluted fruit juice).  Avoid fluids that contain a lot of sugar or caffeine.  Avoid alcohol and foods that  are spicy or fatty.  General instructions   Wash your hands frequently with soap and water. If soap and water are not available, use hand sanitizer. Make sure that everyone in your household washes their hands frequently.  Take over-the-counter and prescription medicines only as told by your health care provider.  Watch your condition for any changes.  Keep all follow-up visits as told by your health care provider. This is important. Contact a health care provider if:  You have a fever.  You are not able to keep fluids down.  Your vomiting gets worse.  You have new symptoms.  You feel light-headed or dizzy.  You have a headache.  You have muscle cramps. Get help right away if:  You have pain in your chest, neck, arm, or jaw.  You feel extremely weak or you faint.  You have persistent vomiting.  You have vomit that is bright red or looks like black coffee grounds.  You have stools that are bloody or black, or stools that look like tar.  You have severe pain, cramping, or bloating in your abdomen.  You have a severe headache, a stiff neck, or both.  You have a rash.  You have trouble breathing or you are breathing very quickly.  Your heart is beating very quickly.  Your skin feels cold and clammy.  You feel confused.  You have pain while urinating.  You have signs of dehydration, such as: ? Dark urine, or very little or no urine. ? Cracked lips. ?  Dry mouth. ? Sunken eyes. ? Sleepiness. ? Weakness. These symptoms may represent a serious problem that is an emergency. Do not wait to see if the symptoms will go away. Get medical help right away. Call your local emergency services (911 in the [place] Do not drive yourself to the hospital. This information is not intended to replace advice given to you by your health care provider. Make sure you discuss any questions you have with your health care provider. Document Released: 10/22/2015 Document Revised:  03/02/2016 Document Reviewed: 06/01/2015 Elsevier Interactive Patient Education  2018 [employer].  Urinary Tract Infection, Adult A urinary tract infection (UTI) is an infection of any part of the urinary tract. The urinary tract includes the:  Kidneys.  Ureters.  Bladder.  Urethra.  These organs make, store, and get rid of pee (urine) in the body. Follow these instructions at home:  Take over-the-counter and prescription medicines only as told by your doctor.  If you were prescribed an antibiotic medicine, take it as told by your doctor. Do not stop taking the antibiotic even if you start to feel better.  Avoid the following drinks: ? Alcohol. ? Caffeine. ? Tea. ? Carbonated drinks.  Drink enough fluid to keep your pee clear or pale yellow.  Keep all follow-up visits as told by your doctor. This is important.  Make sure to: ? Empty your bladder often and completely. Do not to hold pee for long periods of time. ? Empty your bladder before and after sex. ? Wipe from front to back after a bowel movement if you are female. Use each tissue one time when you wipe. Contact a doctor if:  You have back pain.  You have a fever.  You feel sick to your stomach (nauseous).  You throw up (vomit).  Your symptoms do not get better after 3 days.  Your symptoms go away and then come back. Get help right away if:  You have very bad back pain.  You have very bad lower belly (abdominal) pain.  You are throwing up and cannot keep down any medicines or water. This information is not intended to replace advice given to you by your health care provider. Make sure you discuss any questions you have with your health care provider. Document Released: 03/13/2008 Document Revised: 03/02/2016 Document Reviewed: 08/16/2015 Elsevier Interactive Patient Education  2018 [employer].     IF you received an x-ray today, you will receive an invoice from [facility]. Please  contact [facility] at [phone] with questions or concerns regarding your invoice.   IF you received labwork today, you will receive an invoice from [facility]. Please contact [facility] at [phone] with questions or concerns regarding your invoice.   Our billing staff will not be able to assist you with questions regarding bills from these companies.  You will be contacted with the lab results as soon as they are available. The fastest way to get your results is to activate your My Chart account. Instructions are located on the last page of this paperwork. If you have not heard from [place] regarding the results in 2 weeks, please contact this office.

## 2018-04-05 ENCOUNTER — Telehealth: Payer: Self-pay

## 2018-04-05 LAB — BASIC METABOLIC PANEL
BUN/Creatinine Ratio: 11 (ref 9–23)
BUN: 6 mg/dL (ref 6–24)
CO2: 13 mmol/L — AB (ref 20–29)
CREATININE: 0.55 mg/dL — AB (ref 0.57–1.00)
Calcium: 9.1 mg/dL (ref 8.7–10.2)
Chloride: 96 mmol/L (ref 96–106)
GFR calc Af Amer: 126 mL/min/{1.73_m2} (ref >59)
GFR, EST NON AFRICAN AMERICAN: 110 mL/min/{1.73_m2} (ref >59)
Glucose: 95 mg/dL (ref 65–99)
Potassium: 4 mmol/L (ref 3.5–5.2)
SODIUM: 138 mmol/L (ref 134–144)

## 2018-04-05 LAB — URINE CULTURE

## 2018-04-05 NOTE — Telephone Encounter (Signed)
Spoke with pt - Dr Alwyn RenHopper requested us to follow up on her.  She states she is 90 % better Ate whole bowl of cereal Drinking water, gatoraid, Pedialyte, green tea and urinating normally. Pt states she felt good [facility] to [facility] [employer] [facility] today. No vomiting and does not have to take the antinausea meds.  Advised Dr. Alwyn RenHopper is here tomorrow - Sat and we close at 4pm Please call the office immediately if anything changes.   Gave info to Dr. Alwyn RenHopper.

## 2018-04-05 NOTE — Telephone Encounter (Signed)
Stat labs  C02 of 13  (ref range 20-29) given to Dr. Alwyn RenHopper from fax transmission 04/05/2018 2:55am

## 2018-04-23 ENCOUNTER — Ambulatory Visit: Payer: Self-pay | Admitting: Physician Assistant

## 2018-04-23 ENCOUNTER — Encounter: Payer: Self-pay | Admitting: Physician Assistant

## 2018-04-23 ENCOUNTER — Other Ambulatory Visit: Payer: Self-pay | Admitting: Family Medicine

## 2018-04-23 ENCOUNTER — Other Ambulatory Visit: Payer: Self-pay

## 2018-04-23 VITALS — BP 126/80 | HR 100 | Temp 97.9°F | Resp 18 | Ht 65.0 in | Wt 143.0 lb

## 2018-04-23 DIAGNOSIS — F1010 Alcohol abuse, uncomplicated: Secondary | ICD-10-CM

## 2018-04-23 DIAGNOSIS — N3001 Acute cystitis with hematuria: Secondary | ICD-10-CM

## 2018-04-23 DIAGNOSIS — F322 Major depressive disorder, single episode, severe without psychotic features: Secondary | ICD-10-CM

## 2018-04-23 DIAGNOSIS — R82998 Other abnormal findings in urine: Secondary | ICD-10-CM

## 2018-04-23 DIAGNOSIS — F411 Generalized anxiety disorder: Secondary | ICD-10-CM

## 2018-04-23 DIAGNOSIS — R350 Frequency of micturition: Secondary | ICD-10-CM

## 2018-04-23 DIAGNOSIS — N898 Other specified noninflammatory disorders of vagina: Secondary | ICD-10-CM

## 2018-04-23 DIAGNOSIS — R112 Nausea with vomiting, unspecified: Secondary | ICD-10-CM

## 2018-04-23 LAB — POC MICROSCOPIC URINALYSIS (UMFC): Mucus: ABSENT

## 2018-04-23 LAB — POCT CBC
GRANULOCYTE PERCENT: 66.7 % (ref 37–80)
HCT, POC: 39.2 % (ref 37.7–47.9)
Hemoglobin: 11.8 g/dL — AB (ref 12.2–16.2)
Lymph, poc: 1.4 (ref 0.6–3.4)
MCH, POC: 26.5 pg — AB (ref 27–31.2)
MCHC: 30 g/dL — AB (ref 31.8–35.4)
MCV: 88.4 fL (ref 80–97)
MID (cbc): 0.4 (ref 0–0.9)
MPV: 6.8 fL (ref 0–99.8)
PLATELET COUNT, POC: 404 10*3/uL (ref 142–424)
POC Granulocyte: 3.5 (ref 2–6.9)
POC LYMPH PERCENT: 26.4 %L (ref 10–50)
POC MID %: 6.9 %M (ref 0–12)
RBC: 4.43 M/uL (ref 4.04–5.48)
RDW, POC: 21.7 %
WBC: 5.2 10*3/uL (ref 4.6–10.2)

## 2018-04-23 LAB — POCT URINALYSIS DIP (MANUAL ENTRY)
GLUCOSE UA: NEGATIVE mg/dL
LEUKOCYTES UA: NEGATIVE
Nitrite, UA: NEGATIVE
Protein Ur, POC: 30 mg/dL — AB
RBC UA: NEGATIVE
Spec Grav, UA: 1.02 (ref 1.010–1.025)
UROBILINOGEN UA: 1 U/dL
pH, UA: 6.5 (ref 5.0–8.0)

## 2018-04-23 LAB — GLUCOSE, POCT (MANUAL RESULT ENTRY): POC GLUCOSE: 84 mg/dL (ref 70–99)

## 2018-04-23 LAB — POCT URINE PREGNANCY: Preg Test, Ur: NEGATIVE

## 2018-04-23 MED ORDER — FLUOXETINE HCL 10 MG PO CAPS: 10.0000 mg | ORAL_CAPSULE | Freq: Every day | ORAL | 0 refills | Status: DC

## 2018-04-23 MED ORDER — ONDANSETRON 4 MG PO TBDP: ORAL_TABLET | ORAL | 0 refills | Status: DC

## 2018-04-23 NOTE — Progress Notes (Signed)
04/23/2018 at 3:30 PM  [name] / DOB: Jul 09, 1966 / MRN: [id number]  The patient has Post-surgical hypothyroidism; Cellulitis of foot; MDD (major depressive disorder), severe (Victoria Travis); and Alcohol dependence (Victoria Travis) on their problem list.  SUBJECTIVE  Victoria Travis is a 52 y.o. female with PMH IBS, hypothyroidism, and alcohol abuse who complains of diaphoresis, vaginal tingling, vaginal discharge, suprapubic pressure, crying, urinary frequency, dark smelly urine, nausea, and vomiting (1 episode)  x 5 days. Has decreased appetite.  She denies dysuria, hematuria, urinary urgency, flank pain, abdominal pain, pelvic pain and genital rash.  Drinks two 20 oz bottles of water a day. Drinks 2 alcoholic beverages per day. Was treated for UTI on 04/04/18 with bactrim, felt better. No urine culture growth. Denies alcohol useotday . No illicit drug use. Has been out of prozac for a couple of days. Was not consistently taking it before then. Not sure when her last menstrual cycle was. Cycles are irregular. Not on contraception. Not sexually active. Last had BM today. No other questions or concerns.    She  has a past medical history of Thyroid disease.    Medications reviewed and updated by myself where necessary, and exist elsewhere in the encounter.   Victoria Travis has No Known Allergies. She  reports that she has quit smoking. She has never used smokeless tobacco. She reports that she drinks about 8.4 oz of alcohol per week. She reports that she does not use drugs. She  reports that she does not currently engage in sexual activity. The patient  has no past surgical history on file.  Her family history is not on file.  Review of Systems  Respiratory: Negative for shortness of breath.   Cardiovascular: Negative for chest pain, palpitations and leg swelling.  Gastrointestinal: Positive for constipation. Negative for abdominal pain, blood in stool, diarrhea and melena.  Neurological: Negative for dizziness  and headaches.  Psychiatric/Behavioral: Positive for depression and suicidal ideas (in the past, not sucidial today).      OBJECTIVE  Her  height is _0  (1.651 m) and weight is 143 lb (64.9 kg). Her oral temperature is 97.9 F (36.6 C). Her blood pressure is 126/80 and her pulse is 100. Her respiration is 18 and oxygen saturation is 97%.  The patient's body mass index is 23.8 kg/m.  Physical Exam  Constitutional: She is oriented to person, place, and time. She appears well-developed and well-nourished. No distress.  HENT:  Head: Normocephalic and atraumatic.  Eyes: Conjunctivae are normal.  Neck: Normal range of motion.  Cardiovascular: Normal rate, regular rhythm, normal heart sounds and intact distal pulses.  Pulmonary/Chest: Effort normal and breath sounds normal. No tachypnea. She has no decreased breath sounds. She has no wheezes. She has no rhonchi. She has no rales.  Abdominal: Soft. Normal appearance and bowel sounds are normal. There is no tenderness. There is no rigidity, no rebound, no guarding, no CVA tenderness, no tenderness at McBurney's point and negative Murphy's sign. No hernia.  Genitourinary: Uterus normal. Cervix exhibits no motion tenderness. Right adnexum displays no mass, no tenderness and no fullness. Left adnexum displays no mass, no tenderness and no fullness. No tenderness in the vagina. Vaginal discharge (moderate amount of thick white discharge in vaginal canal) found.  Genitourinary Comments: CMA chaperone present for GU exam  Neurological: She is alert and oriented to person, place, and time.  Skin: Skin is warm and dry.  Psychiatric: Her mood appears anxious. She is slowed. She exhibits a  depressed mood. She expresses no suicidal plans and no homicidal plans.  Tearful  Vitals reviewed.   Results for orders placed or performed in visit on 04/23/18 (from the past 24 hour(s))  POCT urinalysis dipstick     Status: Abnormal   Collection Time: 04/23/18   2:45 PM  Result Value Ref Range   Color, UA orange (A) yellow   Clarity, UA cloudy (A) clear   Glucose, UA negative negative mg/dL   Bilirubin, UA small (A) negative   Ketones, POC UA moderate (40) (A) negative mg/dL   Spec Grav, UA 1.020 1.010 - 1.025   Blood, UA negative negative   pH, UA 6.5 5.0 - 8.0   Protein Ur, POC =30 (A) negative mg/dL   Urobilinogen, UA 1.0 0.2 or 1.0 E.U./dL   Nitrite, UA Negative Negative   Leukocytes, UA Negative Negative  POCT Microscopic Urinalysis ([facility])     Status: Abnormal   Collection Time: 04/23/18  3:11 PM  Result Value Ref Range   WBC,UR,HPF,POC Few (A) None WBC/hpf   RBC,UR,HPF,POC None None RBC/hpf   Bacteria Many (A) None, Too numerous to count   Mucus Absent Absent   Epithelial Cells, UR Per Microscopy Many (A) None, Too numerous to count cells/hpf  POCT urine pregnancy     Status: None   Collection Time: 04/23/18  3:24 PM  Result Value Ref Range   Preg Test, Ur Negative Negative  POCT glucose (manual entry)     Status: None   Collection Time: 04/23/18  3:27 PM  Result Value Ref Range   POC Glucose 84 70 - 99 mg/dl    ASSESSMENT & PLAN This case was precepted with Dr. Pamella Travis.   1. Urinary frequency - POCT urinalysis dipstick - POCT Microscopic Urinalysis ([facility])  2. Non-intractable vomiting with nausea, unspecified vomiting type - POCT urine pregnancy - POCT CBC - CMP14+EGFR - POCT glucose (manual entry)  3. Urine white blood cells increased - Urine Culture - ondansetron (ZOFRAN-ODT) 4 MG disintegrating tablet; Take 1 every 6 or 8 hours as needed for nausea or vomiting.  But can be dissolved under tongue.  Dispense: 10 tablet; Refill: 0  4. Alcohol abuse - Lipase - Ethyl Glucuronide, Urine  5. MDD (major depressive disorder), severe ([facility]) - FLUoxetine (PROZAC) 10 MG capsule; Take 1 capsule (10 mg total) by mouth daily. For mood control  Dispense: 30 capsule; Refill: 0  6. Anxiety state - FLUoxetine (PROZAC) 10 MG  capsule; Take 1 capsule (10 mg total) by mouth daily. For mood control  Dispense: 30 capsule; Refill: 0  7. Vaginal discharge - GC/Chlamydia Probe Amp - Wet Prep for Trick, Yeast, Clue - WET PREP FOR TRICH, YEAST, CLUE  Hx and UA not fully suggestive of UTI. Has urinary frequency, no urgency or dysuria. UA neg for leukocytes and nitrites, has few WBCs. UA does suggest dehydration, which is consistent with her lack of water intake. She reports drinking at least 2 liquor drinks per day. No abdominal or CVA tenderness noted on exam. She is afebrile. Has a list of other symptoms that are not consistent with UTI.  Suspect her emotional state is due to being out of prozac. Highly rec she restart medication and take every single day. She is not suicidal today. POC preg negative, POC glucose normal. Will check additional labs at this time to r/o pancreatitis, electrolyte abnormality, yeast, and GC/Chlamydia. Urine cx pending. Suggested she start drinking at least 64 oz of water a day and given  Rx for zofran for nausea. Rec she f/u in office in 2 days for reevaluation. The patient was advised to call or come back to clinic if she does not see an improvement in symptoms, or worsens with the above plan.   Tenna Delaine, PA-C  [facility] 04/23/2018 3:30 PM

## 2018-04-23 NOTE — Patient Instructions (Addendum)
Start zofran every 8 hours. Drink at least 64-80 oz of water daily. Start prozac. We have collected labs today and will have those results within a few days. I also recommend adding an over the counter vit b complex.  If you start to have worsening nausea, vomiting, new fever, chills, or abdominal pain,please seek care immediately. Thank you for letting me participate in your health and well being.  Dehydration, Adult Dehydration is when there is not enough fluid or water in your body. This happens when you lose more fluids than you take in. Dehydration can range from mild to very bad. It should be treated right away to keep it from getting very bad. Symptoms of mild dehydration may include:  Thirst.  Dry lips.  Slightly dry mouth.  Dry, warm skin.  Dizziness. Symptoms of moderate dehydration may include:  Very dry mouth.  Muscle cramps.  Dark pee (urine). Pee may be the color of tea.  Your body making less pee.  Your eyes making fewer tears.  Heartbeat that is uneven or faster than normal (palpitations).  Headache.  Light-headedness, especially when you stand up from sitting.  Fainting (syncope). Symptoms of very bad dehydration may include:  Changes in skin, such as: ? Cold and clammy skin. ? Blotchy (mottled) or pale skin. ? Skin that does not quickly return to normal after being lightly pinched and let go (poor skin turgor).  Changes in body fluids, such as: ? Feeling very thirsty. ? Your eyes making fewer tears. ? Not sweating when body temperature is high, such as in hot weather. ? Your body making very little pee.  Changes in vital signs, such as: ? Weak pulse. ? Pulse that is more than 100 beats a minute when you are sitting still. ? Fast breathing. ? Low blood pressure.  Other changes, such as: ? Sunken eyes. ? Cold hands and feet. ? Confusion. ? Lack of energy (lethargy). ? Trouble waking up from sleep. ? Short-term weight  loss. ? Unconsciousness. Follow these instructions at home:  If told by your doctor, drink an ORS: ? Make an ORS by using instructions on the package. ? Start by drinking small amounts, about  cup (120 mL) every 5-10 minutes. ? Slowly drink more until you have had the amount that your doctor said to have.  Drink enough clear fluid to keep your pee clear or pale yellow. If you were told to drink an ORS, finish the ORS first, then start slowly drinking clear fluids. Drink fluids such as: ? Water. Do not drink only water by itself. Doing that can make the salt (sodium) level in your body get too low (hyponatremia). ? Ice chips. ? Fruit juice that you have added water to (diluted). ? Low-calorie sports drinks.  Avoid: ? Alcohol. ? Drinks that have a lot of sugar. These include high-calorie sports drinks, fruit juice that does not have water added, and soda. ? Caffeine. ? Foods that are greasy or have a lot of fat or sugar.  Take over-the-counter and prescription medicines only as told by your doctor.  Do not take salt tablets. Doing that can make the salt level in your body get too high (hypernatremia).  Eat foods that have minerals (electrolytes). Examples include bananas, oranges, potatoes, tomatoes, and spinach.  Keep all follow-up visits as told by your doctor. This is important. Contact a doctor if:  You have belly (abdominal) pain that: ? Gets worse. ? Stays in one area (localizes).  You have a  rash.  You have a stiff neck.  You get angry or annoyed more easily than normal (irritability).  You are more sleepy than normal.  You have a harder time waking up than normal.  You feel: ? Weak. ? Dizzy. ? Very thirsty.  You have peed (urinated) only a small amount of very dark pee during 6-8 hours. Get help right away if:  You have symptoms of very bad dehydration.  You cannot drink fluids without throwing up (vomiting).  Your symptoms get worse with  treatment.  You have a fever.  You have a very bad headache.  You are throwing up or having watery poop (diarrhea) and it: ? Gets worse. ? Does not go away.  You have blood or something green (bile) in your throw-up.  You have blood in your poop (stool). This may cause poop to look black and tarry.  You have not peed in 6-8 hours.  You pass out (faint).  Your heart rate when you are sitting still is more than 100 beats a minute.  You have trouble breathing. This information is not intended to replace advice given to you by your health care provider. Make sure you discuss any questions you have with your health care provider. Document Released: 07/22/2009 Document Revised: 04/14/2016 Document Reviewed: 11/19/2015 Elsevier Interactive Patient Education  2018 [employer]    IF you received an x-ray today, you will receive an invoice from [facility]. Please contact [facility] at [phone] with questions or concerns regarding your invoice.   IF you received labwork today, you will receive an invoice from [facility]. Please contact [facility] at [phone] with questions or concerns regarding your invoice.   Our billing staff will not be able to assist you with questions regarding bills from these companies.  You will be contacted with the lab results as soon as they are available. The fastest way to get your results is to activate your My Chart account. Instructions are located on the last page of this paperwork. If you have not heard from us regarding the results in 2 weeks, please contact this office.

## 2018-04-24 LAB — CMP14+EGFR
ALBUMIN: 4.4 g/dL (ref 3.5–5.5)
ALT: 71 IU/L — ABNORMAL HIGH (ref 0–32)
AST: 119 IU/L — ABNORMAL HIGH (ref 0–40)
Albumin/Globulin Ratio: 1.7 (ref 1.2–2.2)
Alkaline Phosphatase: 61 IU/L (ref 39–117)
BUN / CREAT RATIO: 16 (ref 9–23)
BUN: 8 mg/dL (ref 6–24)
Bilirubin Total: 0.4 mg/dL (ref 0.0–1.2)
CALCIUM: 8.8 mg/dL (ref 8.7–10.2)
CO2: 25 mmol/L (ref 20–29)
CREATININE: 0.49 mg/dL — AB (ref 0.57–1.00)
Chloride: 95 mmol/L — ABNORMAL LOW (ref 96–106)
GFR calc Af Amer: 131 mL/min/{1.73_m2} (ref >59)
GFR, EST NON AFRICAN AMERICAN: 114 mL/min/{1.73_m2} (ref >59)
GLOBULIN, TOTAL: 2.6 g/dL (ref 1.5–4.5)
Glucose: 94 mg/dL (ref 65–99)
Potassium: 4.1 mmol/L (ref 3.5–5.2)
Sodium: 139 mmol/L (ref 134–144)
Total Protein: 7 g/dL (ref 6.0–8.5)

## 2018-04-24 LAB — GC/CHLAMYDIA PROBE AMP
Chlamydia trachomatis, NAA: NEGATIVE
Neisseria gonorrhoeae by PCR: NEGATIVE

## 2018-04-24 LAB — LIPASE: Lipase: 35 U/L (ref 14–72)

## 2018-04-24 LAB — WET PREP FOR TRICH, YEAST, CLUE
Clue Cell Exam: NEGATIVE
Trichomonas Exam: NEGATIVE
Yeast Exam: NEGATIVE

## 2018-04-25 ENCOUNTER — Telehealth: Payer: Self-pay

## 2018-04-25 ENCOUNTER — Telehealth: Payer: Self-pay | Admitting: Physician Assistant

## 2018-04-25 NOTE — Telephone Encounter (Signed)
Spoke with lab - Urine culture was sent - results still pending.  Spoke wit pt.  She states she is managing pain with OTC meds. Advised cx was still pending - takes several days to grow out a culture. Advised pt to RTC if she is in that much pain.  She states she cannot afford, "is strapped" and trying to manage her medical condition via electronic or phone.   Advised we will call when culture results are available.   Message to [place]

## 2018-04-25 NOTE — Telephone Encounter (Signed)
Please call [facility] and make sure they received a sample for urine culture as order says active.  If they do have a sample, then please call patient and let her know that we have yet to receive those results and will let her know when they return.  If she is in so much pain she needs to be reevaluated in the office.  If they do not have a sample for urine culture, please have her come the office to complete urine culture.

## 2018-04-25 NOTE — Telephone Encounter (Signed)
Message re: lab results sent to [place]. Urine cx not returned yet.

## 2018-04-25 NOTE — Telephone Encounter (Signed)
Copied from CRM [id number]. Topic: Quick Communication - See Telephone Encounter >> Apr 25, 2018  8:53 AM Luanna Coleawoud, Jessica L wrote: CRM for notification. See Telephone encounter for: 04/25/18. Pt states that she would like her results are from lab. She states she was advised that she needed to come back to the office today for the lab results. She states that she can not afford to come back to the office. Please advise Cb#[phone]  Pt states that she is in a lot of pain.

## 2018-04-26 ENCOUNTER — Encounter: Payer: Self-pay | Admitting: Physician Assistant

## 2018-04-27 ENCOUNTER — Telehealth: Payer: Self-pay | Admitting: Physician Assistant

## 2018-04-27 ENCOUNTER — Encounter: Payer: Self-pay | Admitting: Family Medicine

## 2018-04-27 LAB — URINE CULTURE

## 2018-04-27 NOTE — Telephone Encounter (Signed)
FYI  Pt. Called again for lab results, requested me to find another doctor to review the labs and have them release the results to her. Request denied. Pt. Informed that as our Saturday schedule was ran by a "skeleton crew" all of our medical assistants and providers were seeing pts and would be unavailable. Pt. Also informed it was my understanding that it was usually the provider who ordered the labs responsibility to sign and release the labs. Pt reminded that to the best of my understanding labs can take anywhere from two days to two weeks to release.   Pt. preceded to spend about 9 minutes expressing her frustration. Per Ms. Wiseman's earlier phone message pt informed if she feels it is unbearable she should come in for reevaluation. Pt. Expressed further frustration and call was ended shortly after.

## 2018-04-27 NOTE — Telephone Encounter (Signed)
Pt. Called to request lab results, insisted it was urgent and requested provider contact her personally when the results were in.

## 2018-04-28 ENCOUNTER — Other Ambulatory Visit: Payer: Self-pay | Admitting: Physician Assistant

## 2018-04-28 MED ORDER — CIPROFLOXACIN HCL 500 MG PO TABS: 500.0000 mg | ORAL_TABLET | Freq: Two times a day (BID) | ORAL | 0 refills | Status: AC

## 2018-04-29 ENCOUNTER — Telehealth: Payer: Self-pay | Admitting: Physician Assistant

## 2018-04-29 NOTE — Telephone Encounter (Signed)
Pt contacted with urine cx results. Pt now having dysuria and urinary freq. Could not afford to come to follow up appointment. I called in abx for UTI to her pharmacy she requested. Rec f/u if no improvement in 48 hrs or sx worsen.  Other labs shows elevated liver enzymes, which is her baseline but do suggest we get RUQ US and hepatitis panel. She notes she cannot afford other office visits at this time but will take care of this when she can.

## 2018-04-30 ENCOUNTER — Encounter: Payer: Self-pay | Admitting: Physician Assistant

## 2018-05-08 LAB — ETHYL GLUCURONIDE LC/MS/MS
ETHYL GLUCURONIDE: POSITIVE — AB
ETHYL SULFATE: POSITIVE — AB
EtG/EtS LC/MS/MS: POSITIVE — AB
Ethyl Glucuronide LC/MS/MS: >90000 ng/mL

## 2018-05-08 LAB — ETHYL GLUCURONIDE, URINE

## 2018-06-12 ENCOUNTER — Other Ambulatory Visit: Payer: Self-pay | Admitting: Family Medicine

## 2018-06-12 NOTE — Telephone Encounter (Signed)
Copied from CRM [id number]. Topic: Quick Communication - Rx Refill/Question >> Jun 12, 2018  2:33 PM Raoul Pitch, Sade R wrote: Medication: levothyroxine (SYNTHROID, LEVOTHROID) 100 MCG tablet  Has the patient contacted their pharmacy? Yes Preferred Pharmacy (with phone number or street name): [facility] [address], [place] - [address] [phone] (Phone) [phone] (Fax)    Agent: Please be advised that RX refills may take up to 3 business days. We ask that you follow-up with your pharmacy.

## 2018-06-14 MED ORDER — LEVOTHYROXINE SODIUM 100 MCG PO TABS
100.0000 ug | ORAL_TABLET | Freq: Every day | ORAL | 0 refills | Status: DC
Start: 2018-06-14 — End: 2018-06-26

## 2018-06-14 NOTE — Telephone Encounter (Signed)
Pt states [facility] said they have sent this request 3 times. Pt has been out for a week, and now is upset she is having problems getting. Pt would like today, and states she just had labs done.  levothyroxine (SYNTHROID, LEVOTHROID) 100 MCG tablet  90 day    [facility] [address] [place] - [address] [phone] (Phone) [phone] (Fax)   The last time this was filled was not at [place], but has been getting [facility] 6 yrs.

## 2018-06-14 NOTE — Telephone Encounter (Signed)
  Pt states [facility] said they have sent this request 3 times. Pt has been out for a week, and now is upset she is having problems getting. Pt would like today, and states she just had labs done.  levothyroxine (SYNTHROID, LEVOTHROID) 100 MCG tablet   90 day supply requested Last refill by other provider Last Refill:03/11/18 # 30 0 refills Last OV: 7/16 19 PCP: Barnett Abu Pharmacy:[facility],[address], [place]  Unable to refill Ordered by another provider Last TSH 03/08/18 [facility]

## 2018-06-17 NOTE — Telephone Encounter (Signed)
Rx has been sent to pharmacy on 06/14/2018.

## 2018-06-26 ENCOUNTER — Other Ambulatory Visit: Payer: Self-pay

## 2018-06-26 MED ORDER — LEVOTHYROXINE SODIUM 100 MCG PO TABS: 100.0000 ug | ORAL_TABLET | Freq: Every day | ORAL | 0 refills | Status: DC

## 2018-09-27 ENCOUNTER — Ambulatory Visit: Payer: Self-pay | Admitting: Emergency Medicine

## 2018-09-27 ENCOUNTER — Other Ambulatory Visit: Payer: Self-pay

## 2018-09-27 ENCOUNTER — Encounter: Payer: Self-pay | Admitting: Emergency Medicine

## 2018-09-27 VITALS — BP 153/97 | HR 90 | Temp 98.6°F | Ht 66.0 in | Wt 143.4 lb

## 2018-09-27 DIAGNOSIS — R350 Frequency of micturition: Secondary | ICD-10-CM

## 2018-09-27 DIAGNOSIS — N390 Urinary tract infection, site not specified: Secondary | ICD-10-CM

## 2018-09-27 DIAGNOSIS — R112 Nausea with vomiting, unspecified: Secondary | ICD-10-CM

## 2018-09-27 LAB — POCT URINALYSIS DIP (MANUAL ENTRY)
Blood, UA: NEGATIVE
GLUCOSE UA: NEGATIVE mg/dL
Leukocytes, UA: NEGATIVE
Nitrite, UA: NEGATIVE
Protein Ur, POC: 100 mg/dL — AB
Spec Grav, UA: >=1.03 — AB (ref 1.010–1.025)
Urobilinogen, UA: 0.2 E.U./dL
pH, UA: 6 (ref 5.0–8.0)

## 2018-09-27 LAB — POC MICROSCOPIC URINALYSIS (UMFC): Mucus: ABSENT

## 2018-09-27 MED ORDER — CIPROFLOXACIN HCL 500 MG PO TABS: 500.0000 mg | ORAL_TABLET | Freq: Two times a day (BID) | ORAL | 0 refills | Status: AC

## 2018-09-27 MED ORDER — ONDANSETRON HCL 4 MG PO TABS: 4.0000 mg | ORAL_TABLET | Freq: Three times a day (TID) | ORAL | 0 refills | Status: DC | PRN

## 2018-09-27 NOTE — Progress Notes (Signed)
Victoria Travis 52 y.o.   Chief Complaint  Patient presents with  . Urinary Tract Infection    1 week  . Urinary Frequency  . Abdominal Pain  . dahydrated    HISTORY OF PRESENT ILLNESS: This is a 52 y.o. female with history of recurrent UTIs complaining of urinary frequency, urinary incontinence, pelvic throbbing, that started 1 week ago.  Vomited yesterday.  HPI   Prior to Admission medications   Medication Sig Start Date End Date Taking? Authorizing Provider  levothyroxine (SYNTHROID, LEVOTHROID) 100 MCG tablet Take 1 tablet (100 mcg total) by mouth daily before breakfast. For hypothyroidism 06/26/18  Yes Barnett Abu [place] D, PA-C    No Known Allergies  Patient Active Problem List   Diagnosis Date Noted  . Alcohol dependence (HCC) 03/07/2018  . MDD (major depressive disorder), severe (HCC) 03/06/2018  . 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 Cellulitis of foot 06/11/2012    Past Medical History:  Diagnosis Date  . Thyroid disease     No past surgical history on file.  Social History   Socioeconomic History  . Marital status: Married    Spouse name: Not on file  . Number of children: Not on file  . Years of education: Not on file  . Highest education level: Not on file  Occupational History  . Not on file  Social Needs  . Financial resource strain: Not on file  . Food insecurity:    Worry: Not on file    Inability: Not on file  . Transportation needs:    Medical: Not on file    Non-medical: Not on file  Tobacco Use  . Smoking status: Former Games developer  . Smokeless tobacco: Never Used  Substance and Sexual Activity  . Alcohol use: Yes    Alcohol/week: 14.0 standard drinks    Types: 14 Shots of liquor per week    Comment: 1/2 gallon vodka every 9 days - drinks daily - 4 large drinks  . Drug use: No  . Sexual activity: Not Currently  Lifestyle  . Physical activity:    Days per week: Not on file    Minutes per session: Not on file  . Stress: Not on  file  Relationships  . Social connections:    Talks on phone: Not on file    Gets together: Not on file    Attends religious service: Not on file    Active member of club or organization: Not on file    Attends meetings of clubs or organizations: Not on file    Relationship status: Not on file  . Intimate partner violence:    Fear of current or ex partner: Not on file    Emotionally abused: Not on file    Physically abused: Not on file    Forced sexual activity: Not on file  Other Topics Concern  . Not on file  Social History Narrative  . Not on file    No family history on file.   Review of Systems  Constitutional: Negative.  Negative for chills and fever.  HENT: Negative.   Respiratory: Negative.  Negative for cough and shortness of breath.   Cardiovascular: Negative for chest pain and palpitations.  Gastrointestinal: Positive for nausea and vomiting. Negative for abdominal pain and diarrhea.  Genitourinary: Positive for frequency and urgency. Negative for dysuria, flank pain and hematuria.  Skin: Negative.  Negative for rash.  Neurological: Negative.  Negative for dizziness and headaches.  Endo/Heme/Allergies: Negative.   All other systems reviewed  and are negative.   Vitals:   09/27/18 1656  BP: (!) 153/97  Pulse: 90  Temp: 98.6 F (37 C)  SpO2: 98%    Physical Exam Vitals signs reviewed.  Constitutional:      Appearance: She is well-developed.  HENT:     Head: Normocephalic and atraumatic.  Eyes:     Extraocular Movements: Extraocular movements intact.     Pupils: Pupils are equal, round, and reactive to light.  Neck:     Musculoskeletal: Normal range of motion and neck supple.  Cardiovascular:     Rate and Rhythm: Normal rate and regular rhythm.     Heart sounds: Normal heart sounds.  Pulmonary:     Effort: Pulmonary effort is normal.     Breath sounds: Normal breath sounds.  Abdominal:     General: Abdomen is flat. There is no distension.      Palpations: Abdomen is soft.     Tenderness: There is no abdominal tenderness.  Musculoskeletal: Normal range of motion.  Skin:    General: Skin is warm and dry.     Capillary Refill: Capillary refill takes less than 2 seconds.  Neurological:     General: No focal deficit present.     Mental Status: She is alert and oriented to person, place, and time.  Psychiatric:        Mood and Affect: Mood normal.        Behavior: Behavior normal.    Results for orders placed or performed in visit on 09/27/18 (from the past 24 hour(s))  POCT urinalysis dipstick     Status: Abnormal   Collection Time: 09/27/18  5:18 PM  Result Value Ref Range   Color, UA yellow yellow   Clarity, UA clear clear   Glucose, UA negative negative mg/dL   Bilirubin, UA moderate (A) negative   Ketones, POC UA >= (160) (A) negative mg/dL   Spec Grav, UA >=1.610>=1.030 (A) 1.010 - 1.025   Blood, UA negative negative   pH, UA 6.0 5.0 - 8.0   Protein Ur, POC =100 (A) negative mg/dL   Urobilinogen, UA 0.2 0.2 or 1.0 E.U./dL   Nitrite, UA Negative Negative   Leukocytes, UA Negative Negative  POCT Microscopic Urinalysis ([facility])     Status: Abnormal   Collection Time: 09/27/18  5:26 PM  Result Value Ref Range   WBC,UR,HPF,POC None None WBC/hpf   RBC,UR,HPF,POC None None RBC/hpf   Bacteria None None, Too numerous to count   Mucus Absent Absent   Epithelial Cells, UR Per Microscopy Moderate (A) None, Too numerous to count cells/hpf      ASSESSMENT & PLAN: Clydie BraunKaren was seen today for urinary tract infection, urinary frequency, abdominal pain and dahydrated.  Diagnoses and all orders for this visit:  Acute UTI -     ciprofloxacin (CIPRO) 500 MG tablet; Take 1 tablet (500 mg total) by mouth 2 (two) times daily for 7 days.  Urinary frequency -     POCT Microscopic Urinalysis ([facility]) -     POCT urinalysis dipstick -     Urine Culture  Non-intractable vomiting with nausea, unspecified vomiting type -     ondansetron (ZOFRAN)  4 MG tablet; Take 1 tablet (4 mg total) by mouth every 8 (eight) hours as needed for nausea or vomiting.    Patient Instructions       If you have lab work done today you will be contacted with your lab results within the next 2 weeks.  If you have not heard from [place] then please contact us. The fastest way to get your results is to register for My Chart.   IF you received an x-ray today, you will receive an invoice from [facility]. Please contact [facility] at [phone] with questions or concerns regarding your invoice.   IF you received labwork today, you will receive an invoice from [facility]. Please contact [facility] at [phone] with questions or concerns regarding your invoice.   Our billing staff will not be able to assist you with questions regarding bills from these companies.  You will be contacted with the lab results as soon as they are available. The fastest way to get your results is to activate your My Chart account. Instructions are located on the last page of this paperwork. If you have not heard from [place] regarding the results in 2 weeks, please contact this office.      Urinary Tract Infection, Adult A urinary tract infection (UTI) is an infection of any part of the urinary tract. The urinary tract includes:  The kidneys.  The ureters.  The bladder.  The urethra. These organs make, store, and get rid of pee (urine) in the body. What are the causes? This is caused by germs (bacteria) in your genital area. These germs grow and cause swelling (inflammation) of your urinary tract. What increases the risk? You are more likely to develop this condition if:  You have a small, thin tube (catheter) to drain pee.  You cannot control when you pee or poop (incontinence).  You are female, and: ? You use these methods to prevent pregnancy: ? A medicine that kills sperm (spermicide). ? A device that blocks sperm (diaphragm). ? You have low  levels of a female hormone (estrogen). ? You are pregnant.  You have genes that add to your risk.  You are sexually active.  You take antibiotic medicines.  You have trouble peeing because of: ? A prostate that is bigger than normal, if you are female. ? A blockage in the part of your body that drains pee from the bladder (urethra). ? A kidney stone. ? A nerve condition that affects your bladder (neurogenic bladder). ? Not getting enough to drink. ? Not peeing often enough.  You have other conditions, such as: ? Diabetes. ? A weak disease-fighting system (immune system). ? Sickle cell disease. ? Gout. ? Injury of the spine. What are the signs or symptoms? Symptoms of this condition include:  Needing to pee right away (urgently).  Peeing often.  Peeing small amounts often.  Pain or burning when peeing.  Blood in the pee.  Pee that smells bad or not like normal.  Trouble peeing.  Pee that is cloudy.  Fluid coming from the vagina, if you are female.  Pain in the belly or lower back. Other symptoms include:  Throwing up (vomiting).  No urge to eat.  Feeling mixed up (confused).  Being tired and grouchy (irritable).  A fever.  Watery poop (diarrhea). How is this treated? This condition may be treated with:  Antibiotic medicine.  Other medicines.  Drinking enough water. Follow these instructions at home:  Medicines  Take over-the-counter and prescription medicines only as told by your doctor.  If you were prescribed an antibiotic medicine, take it as told by your doctor. Do not stop taking it even if you start to feel better. General instructions  Make sure you: ? Pee until your bladder is empty. ? Do not hold pee  for a long time. ? Empty your bladder after sex. ? Wipe from front to back after pooping if you are a female. Use each tissue one time when you wipe.  Drink enough fluid to keep your pee pale yellow.  Keep all follow-up visits as  told by your doctor. This is important. Contact a doctor if:  You do not get better after 1-2 days.  Your symptoms go away and then come back. Get help right away if:  You have very bad back pain.  You have very bad pain in your lower belly.  You have a fever.  You are sick to your stomach (nauseous).  You are throwing up. Summary  A urinary tract infection (UTI) is an infection of any part of the urinary tract.  This condition is caused by germs in your genital area.  There are many risk factors for a UTI. These include having a small, thin tube to drain pee and not being able to control when you pee or poop.  Treatment includes antibiotic medicines for germs.  Drink enough fluid to keep your pee pale yellow. This information is not intended to replace advice given to you by your health care provider. Make sure you discuss any questions you have with your health care provider. Document Released: 03/13/2008 Document Revised: 04/04/2018 Document Reviewed: 04/04/2018 Elsevier Interactive Patient Education  2019 Elsevier Inc.      Edwina BarthMiguel Lu Paradise, MD [facility]

## 2018-09-27 NOTE — Patient Instructions (Addendum)
   If you have lab work done today you will be contacted with your lab results within the next 2 weeks.  If you have not heard from us then please contact us. The fastest way to get your results is to register for My Chart.   IF you received an x-ray today, you will receive an invoice from [facility]. Please contact [facility] at [phone] with questions or concerns regarding your invoice.   IF you received labwork today, you will receive an invoice from [facility]. Please contact [facility] at [phone] with questions or concerns regarding your invoice.   Our billing staff will not be able to assist you with questions regarding bills from these companies.  You will be contacted with the lab results as soon as they are available. The fastest way to get your results is to activate your My Chart account. Instructions are located on the last page of this paperwork. If you have not heard from us regarding the results in 2 weeks, please contact this office.     Urinary Tract Infection, Adult A urinary tract infection (UTI) is an infection of any part of the urinary tract. The urinary tract includes:  The kidneys.  The ureters.  The bladder.  The urethra. These organs make, store, and get rid of pee (urine) in the body. What are the causes? This is caused by germs (bacteria) in your genital area. These germs grow and cause swelling (inflammation) of your urinary tract. What increases the risk? You are more likely to develop this condition if:  You have a small, thin tube (catheter) to drain pee.  You cannot control when you pee or poop (incontinence).  You are female, and: ? You use these methods to prevent pregnancy: ? A medicine that kills sperm (spermicide). ? A device that blocks sperm (diaphragm). ? You have low levels of a female hormone (estrogen). ? You are pregnant.  You have genes that add to your risk.  You are sexually active.  You take  antibiotic medicines.  You have trouble peeing because of: ? A prostate that is bigger than normal, if you are female. ? A blockage in the part of your body that drains pee from the bladder (urethra). ? A kidney stone. ? A nerve condition that affects your bladder (neurogenic bladder). ? Not getting enough to drink. ? Not peeing often enough.  You have other conditions, such as: ? Diabetes. ? A weak disease-fighting system (immune system). ? Sickle cell disease. ? Gout. ? Injury of the spine. What are the signs or symptoms? Symptoms of this condition include:  Needing to pee right away (urgently).  Peeing often.  Peeing small amounts often.  Pain or burning when peeing.  Blood in the pee.  Pee that smells bad or not like normal.  Trouble peeing.  Pee that is cloudy.  Fluid coming from the vagina, if you are female.  Pain in the belly or lower back. Other symptoms include:  Throwing up (vomiting).  No urge to eat.  Feeling mixed up (confused).  Being tired and grouchy (irritable).  A fever.  Watery poop (diarrhea). How is this treated? This condition may be treated with:  Antibiotic medicine.  Other medicines.  Drinking enough water. Follow these instructions at home:  Medicines  Take over-the-counter and prescription medicines only as told by your doctor.  If you were prescribed an antibiotic medicine, take it as told by your doctor. Do not stop taking it even if   you start to feel better. General instructions  Make sure you: ? Pee until your bladder is empty. ? Do not hold pee for a long time. ? Empty your bladder after sex. ? Wipe from front to back after pooping if you are a female. Use each tissue one time when you wipe.  Drink enough fluid to keep your pee pale yellow.  Keep all follow-up visits as told by your doctor. This is important. Contact a doctor if:  You do not get better after 1-2 days.  Your symptoms go away and then come  back. Get help right away if:  You have very bad back pain.  You have very bad pain in your lower belly.  You have a fever.  You are sick to your stomach (nauseous).  You are throwing up. Summary  A urinary tract infection (UTI) is an infection of any part of the urinary tract.  This condition is caused by germs in your genital area.  There are many risk factors for a UTI. These include having a small, thin tube to drain pee and not being able to control when you pee or poop.  Treatment includes antibiotic medicines for germs.  Drink enough fluid to keep your pee pale yellow. This information is not intended to replace advice given to you by your health care provider. Make sure you discuss any questions you have with your health care provider. Document Released: 03/13/2008 Document Revised: 04/04/2018 Document Reviewed: 04/04/2018 Elsevier Interactive Patient Education  2019 Elsevier Inc.  

## 2018-09-29 LAB — URINE CULTURE

## 2018-10-13 ENCOUNTER — Emergency Department (HOSPITAL_BASED_OUTPATIENT_CLINIC_OR_DEPARTMENT_OTHER): Payer: Self-pay

## 2018-10-13 ENCOUNTER — Other Ambulatory Visit: Payer: Self-pay

## 2018-10-13 ENCOUNTER — Emergency Department (HOSPITAL_BASED_OUTPATIENT_CLINIC_OR_DEPARTMENT_OTHER)
Admission: EM | Admit: 2018-10-13 | Discharge: 2018-10-13 | Disposition: A | Discharge status: Home / Self Care | Payer: Self-pay | Attending: Emergency Medicine | Admitting: Emergency Medicine

## 2018-10-13 ENCOUNTER — Encounter (HOSPITAL_BASED_OUTPATIENT_CLINIC_OR_DEPARTMENT_OTHER): Payer: Self-pay | Admitting: Emergency Medicine

## 2018-10-13 DIAGNOSIS — Z87891 Personal history of nicotine dependence: Secondary | ICD-10-CM | POA: Insufficient documentation

## 2018-10-13 DIAGNOSIS — E876 Hypokalemia: Secondary | ICD-10-CM

## 2018-10-13 DIAGNOSIS — M25472 Effusion, left ankle: Secondary | ICD-10-CM

## 2018-10-13 DIAGNOSIS — Z79899 Other long term (current) drug therapy: Secondary | ICD-10-CM | POA: Insufficient documentation

## 2018-10-13 DIAGNOSIS — R252 Cramp and spasm: Secondary | ICD-10-CM

## 2018-10-13 DIAGNOSIS — E039 Hypothyroidism, unspecified: Secondary | ICD-10-CM | POA: Insufficient documentation

## 2018-10-13 HISTORY — DX: Irritable bowel syndrome, unspecified: K58.9

## 2018-10-13 LAB — URINALYSIS, ROUTINE W REFLEX MICROSCOPIC
Bilirubin Urine: NEGATIVE
Glucose, UA: NEGATIVE mg/dL
Hgb urine dipstick: NEGATIVE
Leukocytes, UA: NEGATIVE
Nitrite: NEGATIVE
PH: 7.5 (ref 5.0–8.0)
Protein, ur: NEGATIVE mg/dL
Specific Gravity, Urine: 1.01 (ref 1.005–1.030)

## 2018-10-13 LAB — COMPREHENSIVE METABOLIC PANEL
ALT: 67 U/L — ABNORMAL HIGH (ref 0–44)
AST: 72 U/L — ABNORMAL HIGH (ref 15–41)
Albumin: 3.8 g/dL (ref 3.5–5.0)
Alkaline Phosphatase: 59 U/L (ref 38–126)
Anion gap: 15 (ref 5–15)
BUN: 9 mg/dL (ref 6–20)
CO2: 24 mmol/L (ref 22–32)
Calcium: 8.3 mg/dL — ABNORMAL LOW (ref 8.9–10.3)
Chloride: 95 mmol/L — ABNORMAL LOW (ref 98–111)
Creatinine, Ser: 0.65 mg/dL (ref 0.44–1.00)
GFR calc Af Amer: >60 mL/min (ref >60)
GFR calc non Af Amer: >60 mL/min (ref >60)
Glucose, Bld: 112 mg/dL — ABNORMAL HIGH (ref 70–99)
Potassium: 3.1 mmol/L — ABNORMAL LOW (ref 3.5–5.1)
Sodium: 134 mmol/L — ABNORMAL LOW (ref 135–145)
Total Bilirubin: 1.3 mg/dL — ABNORMAL HIGH (ref 0.3–1.2)
Total Protein: 6.9 g/dL (ref 6.5–8.1)

## 2018-10-13 LAB — CBC WITH DIFFERENTIAL/PLATELET
Abs Immature Granulocytes: 0.02 10*3/uL (ref 0.00–0.07)
Basophils Absolute: 0 10*3/uL (ref 0.0–0.1)
Basophils Relative: 1 %
Eosinophils Absolute: 0 10*3/uL (ref 0.0–0.5)
Eosinophils Relative: 0 %
HCT: 34.7 % — ABNORMAL LOW (ref 36.0–46.0)
Hemoglobin: 11.3 g/dL — ABNORMAL LOW (ref 12.0–15.0)
Immature Granulocytes: 0 %
Lymphocytes Relative: 13 %
Lymphs Abs: 0.8 10*3/uL (ref 0.7–4.0)
MCH: 31.9 pg (ref 26.0–34.0)
MCHC: 32.6 g/dL (ref 30.0–36.0)
MCV: 98 fL (ref 80.0–100.0)
Monocytes Absolute: 0.4 10*3/uL (ref 0.1–1.0)
Monocytes Relative: 7 %
Neutro Abs: 5.1 10*3/uL (ref 1.7–7.7)
Neutrophils Relative %: 79 %
Platelets: 330 10*3/uL (ref 150–400)
RBC: 3.54 MIL/uL — ABNORMAL LOW (ref 3.87–5.11)
RDW: 18.6 % — ABNORMAL HIGH (ref 11.5–15.5)
WBC: 6.4 10*3/uL (ref 4.0–10.5)
nRBC: 0 % (ref 0.0–0.2)

## 2018-10-13 LAB — PREGNANCY, URINE: Preg Test, Ur: NEGATIVE

## 2018-10-13 LAB — MAGNESIUM: Magnesium: 1.2 mg/dL — ABNORMAL LOW (ref 1.7–2.4)

## 2018-10-13 LAB — LIPASE, BLOOD: Lipase: 33 U/L (ref 11–51)

## 2018-10-13 LAB — TSH: TSH: 5.83 u[IU]/mL — ABNORMAL HIGH (ref 0.350–4.500)

## 2018-10-13 MED ORDER — MAGNESIUM SULFATE 2 GM/50ML IV SOLN
2.0000 g | Freq: Once | INTRAVENOUS | Status: AC
  Administered 2018-10-13: 2 g via INTRAVENOUS
  Filled 2018-10-13: qty 50

## 2018-10-13 MED ORDER — SODIUM CHLORIDE 0.9 % IV BOLUS
1000.0000 mL | Freq: Once | INTRAVENOUS | Status: AC
  Administered 2018-10-13: 1000 mL via INTRAVENOUS

## 2018-10-13 MED ORDER — CALCIUM CARBONATE ANTACID 500 MG PO CHEW
800.0000 mg | CHEWABLE_TABLET | Freq: Once | ORAL | Status: AC
  Administered 2018-10-13: 800 mg via ORAL
  Filled 2018-10-13: qty 4

## 2018-10-13 MED ORDER — CALCIUM CARBONATE ANTACID 500 MG PO CHEW: 3.0000 | CHEWABLE_TABLET | Freq: Every day | ORAL | 0 refills | Status: AC

## 2018-10-13 MED ORDER — POTASSIUM CHLORIDE ER 10 MEQ PO TBCR: 10.0000 meq | EXTENDED_RELEASE_TABLET | Freq: Every day | ORAL | 0 refills | Status: DC

## 2018-10-13 MED ORDER — POTASSIUM CHLORIDE CRYS ER 20 MEQ PO TBCR
60.0000 meq | EXTENDED_RELEASE_TABLET | Freq: Once | ORAL | Status: AC
  Administered 2018-10-13: 60 meq via ORAL
  Filled 2018-10-13: qty 3

## 2018-10-13 MED ORDER — THIAMINE HCL 100 MG/ML IJ SOLN
100.0000 mg | Freq: Once | INTRAMUSCULAR | Status: AC
  Administered 2018-10-13: 100 mg via INTRAVENOUS
  Filled 2018-10-13: qty 2

## 2018-10-13 MED ORDER — MAGNESIUM GLUCONATE 30 MG PO TABS: 60.0000 mg | ORAL_TABLET | Freq: Two times a day (BID) | ORAL | 0 refills | Status: DC

## 2018-10-13 MED ORDER — ONDANSETRON HCL 4 MG/2ML IJ SOLN
4.0000 mg | Freq: Once | INTRAMUSCULAR | Status: AC
  Administered 2018-10-13: 4 mg via INTRAVENOUS
  Filled 2018-10-13: qty 2

## 2018-10-13 NOTE — ED Triage Notes (Addendum)
Pt states she has IBS and is c/o abdominal pain with N/V/D x 2 days. Also having bilateral hand cramping and left foot cramping. Pt was recently treated for UTI with Cipro and finished taking medications earlier this week,  States she is feeling the symptoms returning.

## 2018-10-13 NOTE — ED Notes (Signed)
Victoria Travis [phone]

## 2018-10-13 NOTE — Discharge Instructions (Addendum)
Take further replenishment of magnesium, calcium, potassium as prescribed.  Make sure to drink plenty of water.  Avoid alcohol as much as possible.  Please follow-up with your doctor in a few days for recheck of your blood pressure.  Please also follow-up with them if you continue to have spasms of your hands.  Please return the emergency department he develop any new or worsening symptoms.

## 2018-10-13 NOTE — ED Provider Notes (Addendum)
[facility] EMERGENCY DEPARTMENT Provider Note   CSN: [id number] Arrival date & time: 10/13/18  1634     History   Chief Complaint Chief Complaint  Patient presents with  . Abdominal Pain    HPI Victoria Travis is a 53 y.o. female with history of IBS, thyroid disease, alcohol dependence who presents with multiple complaints.  Patient reports with ongoing IBS symptoms with intermittent nausea, vomiting, and diarrhea.  She also reports a 1 day history of bilateral hand and arm cramping.  She reports intermittent seizing and tightness of her fists that is uncontrollable.  She also reports a 1 day history of left ankle swelling.  She denies any pain or injury.  She notes that she just finished a 2 to 3-day bender of drinking 2 bottles of vodka over the course of 2 to 3 days.  She reports she normally drinks a couple cocktails daily, but she has been under a lot of stress and drink more than usual.  She also reports some urinary frequency.  She did recently finished Cipro for urinary tract infection.  Per chart review, her urine culture grew out less than 10,000 bacteria 2 weeks ago.  Patient denies any significant abdominal pain, but notes that she has a butterfly feeling in her stomach sometimes.  She denies any chest pain, shortness of breath, fevers.  She denies any other leg pain or swelling, recent long trips, surgeries, known cancer, history of blood clots, exogenous estrogen use.  Patient notes that she skipped 2 days of her levothyroxine when she was drinking.  HPI  Past Medical History:  Diagnosis Date  . IBS (irritable bowel syndrome)   . Thyroid disease     Patient Active Problem List   Diagnosis Date Noted  . Alcohol dependence (HCC) 03/07/2018  . MDD (major depressive disorder), severe (HCC) 03/06/2018  . 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 Cellulitis of foot 06/11/2012    History reviewed. No pertinent surgical history.   OB History   No obstetric history  on file.      Home Medications    Prior to Admission medications   Medication Sig Start Date End Date Taking? Authorizing Provider  calcium carbonate (TUMS) 500 MG chewable tablet Chew 3 tablets (600 mg of elemental calcium total) by mouth daily for 3 days. 10/13/18 10/16/18  Emi Holes, PA-C  levothyroxine (SYNTHROID, LEVOTHROID) 100 MCG tablet Take 1 tablet (100 mcg total) by mouth daily before breakfast. For hypothyroidism 06/26/18   Benjiman Core D, PA-C  magnesium gluconate (MAGONATE) 30 MG tablet Take 2 tablets (60 mg total) by mouth 2 (two) times daily. 10/13/18   Mccartney Brucks, Waylan Boga, PA-C  ondansetron (ZOFRAN) 4 MG tablet Take 1 tablet (4 mg total) by mouth every 8 (eight) hours as needed for nausea or vomiting. 09/27/18   Georgina Quint, MD  potassium chloride (K-DUR) 10 MEQ tablet Take 1 tablet (10 mEq total) by mouth daily. 10/13/18   Emi Holes, PA-C    Family History History reviewed. No pertinent family history.  Social History Social History   Tobacco Use  . Smoking status: Former Games developer  . Smokeless tobacco: Never Used  Substance Use Topics  . Alcohol use: Yes    Alcohol/week: 14.0 standard drinks    Types: 14 Shots of liquor per week    Comment: 1/2 gallon vodka every 9 days - drinks daily - 4 large drinks  . Drug use: No     Allergies   Patient has  no known allergies.   Review of Systems Review of Systems  Constitutional: Negative for chills and fever.  HENT: Negative for facial swelling and sore throat.   Respiratory: Negative for shortness of breath.   Cardiovascular: Negative for chest pain.  Gastrointestinal: Positive for abdominal pain, diarrhea, nausea and vomiting.  Genitourinary: Negative for dysuria.  Musculoskeletal: Positive for joint swelling and myalgias. Negative for back pain.  Skin: Negative for rash and wound.  Neurological: Negative for headaches.  Psychiatric/Behavioral: The patient is not nervous/anxious.       Physical Exam Updated Vital Signs BP (!) 170/109 (BP Location: Left Arm)   Pulse 83   Temp 98.7 F (37.1 C) (Oral)   Resp 20   Ht 5\' 6"  (1.676 m)   Wt 65.8 kg   SpO2 100%   BMI 23.40 kg/m   Physical Exam Vitals signs and nursing note reviewed.  Constitutional:      General: She is not in acute distress.    Appearance: She is well-developed. She is not diaphoretic.  HENT:     Head: Normocephalic and atraumatic.     Mouth/Throat:     Pharynx: No oropharyngeal exudate.  Eyes:     General: No scleral icterus.       Right eye: No discharge.        Left eye: No discharge.     Conjunctiva/sclera: Conjunctivae normal.     Pupils: Pupils are equal, round, and reactive to light.  Neck:     Musculoskeletal: Normal range of motion and neck supple.     Thyroid: No thyromegaly.  Cardiovascular:     Rate and Rhythm: Normal rate and regular rhythm.     Heart sounds: Normal heart sounds. No murmur. No friction rub. No gallop.   Pulmonary:     Effort: Pulmonary effort is normal. No respiratory distress.     Breath sounds: Normal breath sounds. No stridor. No wheezing or rales.  Abdominal:     General: Bowel sounds are normal. There is no distension.     Palpations: Abdomen is soft.     Tenderness: There is no abdominal tenderness. There is no right CVA tenderness, left CVA tenderness, guarding or rebound.  Musculoskeletal:     Comments: Left ankle swelling, no tenderness to the ankle, calf, knee, foot; no erythema, no pain with movement  Lymphadenopathy:     Cervical: No cervical adenopathy.  Skin:    General: Skin is warm and dry.     Coloration: Skin is not pale.     Findings: No rash.  Neurological:     Mental Status: She is alert.     GCS: GCS eye subscore is 4. GCS verbal subscore is 5. GCS motor subscore is 6.     Coordination: Coordination normal.     Comments: CN 3-12 intact; normal sensation throughout; 5/5 strength in all 4 extremities; equal bilateral grip  strength      ED Treatments / Results  Labs (all labs ordered are listed, but only abnormal results are displayed) Labs Reviewed  URINALYSIS, ROUTINE W REFLEX MICROSCOPIC - Abnormal; Notable for the following components:      Result Value   Ketones, ur >80 (*)    All other components within normal limits  COMPREHENSIVE METABOLIC PANEL - Abnormal; Notable for the following components:   Sodium 134 (*)    Potassium 3.1 (*)    Chloride 95 (*)    Glucose, Bld 112 (*)    Calcium 8.3 (*)  AST 72 (*)    ALT 67 (*)    Total Bilirubin 1.3 (*)    All other components within normal limits  CBC WITH DIFFERENTIAL/PLATELET - Abnormal; Notable for the following components:   RBC 3.54 (*)    Hemoglobin 11.3 (*)    HCT 34.7 (*)    RDW 18.6 (*)    All other components within normal limits  MAGNESIUM - Abnormal; Notable for the following components:   Magnesium 1.2 (*)    All other components within normal limits  PREGNANCY, URINE  LIPASE, BLOOD  TSH    EKG None  Radiology Dg Ankle Complete Left  Result Date: 10/13/2018 CLINICAL DATA:  Swelling for 1 day.  No injury. EXAM: LEFT ANKLE COMPLETE - 3+ VIEW COMPARISON:  None. FINDINGS: No fracture deformity nor dislocation. The ankle mortise appears congruent and the tibiofibular syndesmosis intact. No destructive bony lesions. Soft tissue swelling without subcutaneous gas or radiopaque foreign bodies. IMPRESSION: 1. Soft tissue swelling, no acute osseous process. Electronically Signed   By: Awilda Metroourtnay  Bloomer M.D.   On: 10/13/2018 18:19    Procedures Procedures (including critical care time)  Medications Ordered in ED Medications  sodium chloride 0.9 % bolus 1,000 mL (1,000 mLs Intravenous New Bag/Given 10/13/18 1950)  sodium chloride 0.9 % bolus 1,000 mL (1,000 mLs Intravenous New Bag/Given 10/13/18 1751)  thiamine (B-1) injection 100 mg (100 mg Intravenous Given 10/13/18 1751)  magnesium sulfate IVPB 2 g 50 mL (2 g Intravenous New  Bag/Given 10/13/18 1838)  potassium chloride SA (K-DUR,KLOR-CON) CR tablet 60 mEq (60 mEq Oral Given 10/13/18 1836)  calcium carbonate (TUMS - dosed in mg elemental calcium) chewable tablet 800 mg of elemental calcium (800 mg of elemental calcium Oral Given 10/13/18 1940)     Initial Impression / Assessment and Plan / ED Course  I have reviewed the triage vital signs and the nursing notes.  Pertinent labs & imaging results that were available during my care of the patient were reviewed by me and considered in my medical decision making (see chart for details).     Patient presenting with multiple complaints.  Patient found to have hypo-kalemia, hypomagnesemia, hypocalcemia.  All were placed in the ED.  Patient given 2 L of IV fluids.  Hemoglobin is 11.3, stable chronic anemia.  Left ankle x-ray is negative.  There are no signs of septic joint.  Patient has no pain, redness, or pain with range of motion.  Elevation and compression stockings discussed.  Patient will be discharged home with few days of further electrolyte replacement.  Patient advised to follow-up with PCP for recheck of blood pressure, as it was elevated some degree in the ED.  EKG is pending at shift change.  Electrolyte abnormalities are objectively mild, however patient was having cramping of her right hand with a blood pressure cuff going up and down.  Calcium is only 8.3, however this could be a possibility.  At shift change, [facility], who will discharge patient when her second liter of fluids is finished.  Return precautions discussed with the patient.  She understands and agrees with plan.  Patient vitals stable throughout ED course and discharged in satisfactory condition. I discussed patient case with Dr. Jacqulyn BathLong who guided the patient's management and agrees with plan.   Final Clinical Impressions(s) / ED Diagnoses   Final diagnoses:  Hypokalemia  Hypomagnesemia  Hypocalcemia  Left  ankle swelling  Cramping of hands  ED Discharge Orders         Ordered    potassium chloride (K-DUR) 10 MEQ tablet  Daily     10/13/18 2013    magnesium gluconate (MAGONATE) 30 MG tablet  2 times daily     10/13/18 2013    calcium carbonate (TUMS) 500 MG chewable tablet  Daily     10/13/18 2013             Eber Ferrufino M, [place]PA-C 10/13/18 2021    Maia PlanLong, Joshua G, MD 10/14/18 1734

## 2018-10-13 NOTE — ED Notes (Signed)
Pts family member leaving to eat, requesting to speak with dr when convenient. [phone] Victoria Travis (spouse). Family member informed that dr would speak with him when family member returns.

## 2018-12-12 ENCOUNTER — Encounter: Payer: Self-pay | Admitting: Family Medicine

## 2018-12-12 ENCOUNTER — Other Ambulatory Visit: Payer: Self-pay

## 2018-12-12 ENCOUNTER — Emergency Department
Admission: EM | Admit: 2018-12-12 | Discharge: 2018-12-12 | Disposition: A | Discharge status: Home / Self Care | Payer: Self-pay | Source: Home / Self Care

## 2018-12-12 DIAGNOSIS — J40 Bronchitis, not specified as acute or chronic: Secondary | ICD-10-CM

## 2018-12-12 MED ORDER — ALBUTEROL SULFATE HFA 108 (90 BASE) MCG/ACT IN AERS: 2.0000 | INHALATION_SPRAY | RESPIRATORY_TRACT | 1 refills | Status: DC | PRN

## 2018-12-12 MED ORDER — PREDNISONE 20 MG PO TABS: ORAL_TABLET | ORAL | 1 refills | Status: DC

## 2018-12-12 MED ORDER — HYDROCODONE-HOMATROPINE 5-1.5 MG/5ML PO SYRP: 5.0000 mL | ORAL_SOLUTION | Freq: Four times a day (QID) | ORAL | 0 refills | Status: DC | PRN

## 2018-12-12 NOTE — ED Provider Notes (Signed)
[name]    CSN: [id number] Arrival date & time: 12/12/18  1655     History   Chief Complaint Chief Complaint  Patient presents with  . Cough  . Shortness of Breath  . Hoarse    HPI Victoria Travis is a 53 y.o. female.   Cough, congestion, and diarrhea  This 53 year old woman became sick last week about 7 days ago with fever and cough.  She seemed to get better over the weekend but then the symptoms worsened on Monday and she has had persistent cough ever since.  She had some incontinence with a cough earlier today.  Her past medical history is significant for heavy alcohol use.     Past Medical History:  Diagnosis Date  . IBS (irritable bowel syndrome)   . Thyroid disease     Patient Active Problem List   Diagnosis Date Noted  . Alcohol dependence (HCC) 03/07/2018  . MDD (major depressive disorder), severe (HCC) 03/06/2018  . 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 Cellulitis of foot 06/11/2012    History reviewed. No pertinent surgical history.  OB History   No obstetric history on file.      Home Medications    Prior to Admission medications   Medication Sig Start Date End Date Taking? Authorizing Provider  dicyclomine (BENTYL) 10 MG capsule Take 10 mg by mouth 4 (four) times daily -  before meals and at bedtime.   Yes [provider]  albuterol (PROVENTIL HFA;VENTOLIN HFA) 108 (90 Base) MCG/ACT inhaler Inhale 2 puffs into the lungs every 4 (four) hours as needed for wheezing or shortness of breath (cough, shortness of breath or wheezing.). 12/12/18   Elvina Sidle, MD  HYDROcodone-homatropine (HYDROMET) 5-1.5 MG/5ML syrup Take 5 mLs by mouth every 6 (six) hours as needed for cough. 12/12/18   Elvina Sidle, MD  levothyroxine (SYNTHROID, LEVOTHROID) 100 MCG tablet Take 1 tablet (100 mcg total) by mouth daily before breakfast. For hypothyroidism 06/26/18   Benjiman Core D, PA-C  magnesium gluconate (MAGONATE) 30 MG tablet Take  2 tablets (60 mg total) by mouth 2 (two) times daily. 10/13/18   Law, Waylan Boga, PA-C  ondansetron (ZOFRAN) 4 MG tablet Take 1 tablet (4 mg total) by mouth every 8 (eight) hours as needed for nausea or vomiting. 09/27/18   Georgina Quint, MD  potassium chloride (K-DUR) 10 MEQ tablet Take 1 tablet (10 mEq total) by mouth daily. 10/13/18   Emi Holes, PA-C  predniSONE (DELTASONE) 20 MG tablet 2 daily with food 12/12/18   Elvina Sidle, MD    Family History History reviewed. No pertinent family history.  Social History Social History   Tobacco Use  . Smoking status: Former Games developer  . Smokeless tobacco: Never Used  Substance Use Topics  . Alcohol use: Yes    Alcohol/week: 14.0 standard drinks    Types: 14 Shots of liquor per week    Comment: 1/2 gallon vodka every 9 days - drinks daily - 4 large drinks  . Drug use: No     Allergies   Patient has no known allergies.   Review of Systems Review of Systems   Physical Exam Triage Vital Signs ED Triage Vitals  Enc Vitals Group     BP      Pulse      Resp      Temp      Temp src      SpO2      Weight  Height      Head Circumference      Peak Flow      Pain Score      Pain Loc      Pain Edu?      Excl. in GC?    No data found.  Updated Vital Signs BP 123/86 (BP Location: Right Arm)   Pulse 90   Temp 98.7 F (37.1 C) (Oral)   Resp 20   Ht  (1.676 m)   Wt 65.3 kg   LMP 12/05/2018   SpO2 98%   BMI 23.24 kg/m    Physical Exam Vitals signs and nursing note reviewed.  Constitutional:      General: She is not in acute distress.    Appearance: She is well-developed and normal weight. She is not ill-appearing or toxic-appearing.  HENT:     Head: Normocephalic.     Mouth/Throat:     Mouth: Mucous membranes are moist.     Pharynx: Oropharynx is clear.  Eyes:     Extraocular Movements: Extraocular movements intact.     Pupils: Pupils are equal, round, and reactive to light.  Neck:      Musculoskeletal: Normal range of motion and neck supple.  Cardiovascular:     Rate and Rhythm: Normal rate.  Pulmonary:     Effort: Pulmonary effort is normal.     Breath sounds: Examination of the right-upper field reveals wheezing. Examination of the left-upper field reveals wheezing. Examination of the right-middle field reveals wheezing. Examination of the left-middle field reveals wheezing. Examination of the right-lower field reveals wheezing. Examination of the left-lower field reveals wheezing. Wheezing present.  Chest:     Chest wall: No deformity.  Musculoskeletal: Normal range of motion.  Skin:    General: Skin is warm and dry.  Neurological:     General: No focal deficit present.     Mental Status: She is alert.  Psychiatric:        Mood and Affect: Mood is anxious.        Behavior: Behavior normal.      UC Treatments / Results  Labs (all labs ordered are listed, but only abnormal results are displayed) Labs Reviewed - No data to display  EKG None  Radiology No results found.  Procedures Procedures (including critical care time)  Medications Ordered in UC Medications - No data to display  Initial Impression / Assessment and Plan / UC Course  I have reviewed the triage vital signs and the nursing notes.  Pertinent labs & imaging results that were available during my care of the patient were reviewed by me and considered in my medical decision making (see chart for details).    Final Clinical Impressions(s) / UC Diagnoses   Final diagnoses:  Bronchitis     Discharge Instructions     Avoid any use of alcohol for the next 5 days.  Instead, drink plenty of liquids including juice, tea with honey, and water.  Expect improvement in 48 hours.    ED Prescriptions    Medication Sig Dispense Auth. Provider   predniSONE (DELTASONE) 20 MG tablet 2 daily with food 10 tablet Elvina Sidle, MD   albuterol (PROVENTIL HFA;VENTOLIN HFA) 108 (90 Base) MCG/ACT  inhaler Inhale 2 puffs into the lungs every 4 (four) hours as needed for wheezing or shortness of breath (cough, shortness of breath or wheezing.). 1 Inhaler Elvina Sidle, MD   HYDROcodone-homatropine (HYDROMET) 5-1.5 MG/5ML syrup Take 5 mLs by mouth every 6 (  six) hours as needed for cough. 60 mL Elvina Sidle, MD     Controlled Substance Prescriptions [place] Controlled Substance Registry consulted? Not Applicable   Elvina Sidle, MD 12/12/18 1725

## 2018-12-12 NOTE — Discharge Instructions (Addendum)
Avoid any use of alcohol for the next 5 days.  Instead, drink plenty of liquids including juice, tea with honey, and water.  Expect improvement in 48 hours.

## 2018-12-12 NOTE — ED Triage Notes (Signed)
Had flu like sx last week; fever, upset stomach. Lethargic.  Felt a little better over the weekend, came back worse Monday.  Now has a cough, productive at times, no medication is working.

## 2019-01-29 ENCOUNTER — Encounter (HOSPITAL_BASED_OUTPATIENT_CLINIC_OR_DEPARTMENT_OTHER): Payer: Self-pay | Admitting: Emergency Medicine

## 2019-01-29 ENCOUNTER — Emergency Department (HOSPITAL_BASED_OUTPATIENT_CLINIC_OR_DEPARTMENT_OTHER)
Admission: EM | Admit: 2019-01-29 | Discharge: 2019-01-29 | Disposition: A | Discharge status: Home / Self Care | Payer: Self-pay | Attending: Emergency Medicine | Admitting: Emergency Medicine

## 2019-01-29 ENCOUNTER — Emergency Department (HOSPITAL_BASED_OUTPATIENT_CLINIC_OR_DEPARTMENT_OTHER): Payer: Self-pay

## 2019-01-29 ENCOUNTER — Other Ambulatory Visit: Payer: Self-pay

## 2019-01-29 DIAGNOSIS — Z87891 Personal history of nicotine dependence: Secondary | ICD-10-CM | POA: Insufficient documentation

## 2019-01-29 DIAGNOSIS — R0602 Shortness of breath: Secondary | ICD-10-CM

## 2019-01-29 DIAGNOSIS — E039 Hypothyroidism, unspecified: Secondary | ICD-10-CM | POA: Insufficient documentation

## 2019-01-29 DIAGNOSIS — Z79899 Other long term (current) drug therapy: Secondary | ICD-10-CM | POA: Insufficient documentation

## 2019-01-29 DIAGNOSIS — J40 Bronchitis, not specified as acute or chronic: Secondary | ICD-10-CM | POA: Insufficient documentation

## 2019-01-29 HISTORY — DX: Depression, unspecified: F32.A

## 2019-01-29 HISTORY — DX: Alcohol dependence, uncomplicated: F10.20

## 2019-01-29 HISTORY — DX: Major depressive disorder, single episode, unspecified: F32.9

## 2019-01-29 LAB — CBC WITH DIFFERENTIAL/PLATELET
Abs Immature Granulocytes: 0.01 10*3/uL (ref 0.00–0.07)
Basophils Absolute: 0 10*3/uL (ref 0.0–0.1)
Basophils Relative: 1 %
Eosinophils Absolute: 0 10*3/uL (ref 0.0–0.5)
Eosinophils Relative: 1 %
HCT: 39 % (ref 36.0–46.0)
Hemoglobin: 12.7 g/dL (ref 12.0–15.0)
Immature Granulocytes: 0 %
Lymphocytes Relative: 34 %
Lymphs Abs: 1.5 10*3/uL (ref 0.7–4.0)
MCH: 32.2 pg (ref 26.0–34.0)
MCHC: 32.6 g/dL (ref 30.0–36.0)
MCV: 99 fL (ref 80.0–100.0)
Monocytes Absolute: 0.4 10*3/uL (ref 0.1–1.0)
Monocytes Relative: 8 %
Neutro Abs: 2.6 10*3/uL (ref 1.7–7.7)
Neutrophils Relative %: 56 %
Platelets: ADEQUATE 10*3/uL (ref 150–400)
RBC: 3.94 MIL/uL (ref 3.87–5.11)
RDW: 18.7 % — ABNORMAL HIGH (ref 11.5–15.5)
Smear Review: NORMAL
WBC: 4.6 10*3/uL (ref 4.0–10.5)
nRBC: 0 % (ref 0.0–0.2)

## 2019-01-29 LAB — COMPREHENSIVE METABOLIC PANEL
ALT: 113 U/L — ABNORMAL HIGH (ref 0–44)
AST: 181 U/L — ABNORMAL HIGH (ref 15–41)
Albumin: 4 g/dL (ref 3.5–5.0)
Alkaline Phosphatase: 52 U/L (ref 38–126)
Anion gap: 17 — ABNORMAL HIGH (ref 5–15)
BUN: 12 mg/dL (ref 6–20)
CO2: 20 mmol/L — ABNORMAL LOW (ref 22–32)
Calcium: 8.6 mg/dL — ABNORMAL LOW (ref 8.9–10.3)
Chloride: 104 mmol/L (ref 98–111)
Creatinine, Ser: 0.61 mg/dL (ref 0.44–1.00)
GFR calc Af Amer: >60 mL/min (ref >60)
GFR calc non Af Amer: >60 mL/min (ref >60)
Glucose, Bld: 87 mg/dL (ref 70–99)
Potassium: 3.4 mmol/L — ABNORMAL LOW (ref 3.5–5.1)
Sodium: 141 mmol/L (ref 135–145)
Total Bilirubin: 0.9 mg/dL (ref 0.3–1.2)
Total Protein: 6.9 g/dL (ref 6.5–8.1)

## 2019-01-29 LAB — BRAIN NATRIURETIC PEPTIDE: B Natriuretic Peptide: 13.5 pg/mL (ref 0.0–100.0)

## 2019-01-29 LAB — TROPONIN I: Troponin I: <0.03 ng/mL (ref <0.03)

## 2019-01-29 LAB — D-DIMER, QUANTITATIVE: D-Dimer, Quant: 0.79 ug/mL-FEU — ABNORMAL HIGH (ref 0.00–0.50)

## 2019-01-29 MED ORDER — ONDANSETRON HCL 4 MG/2ML IJ SOLN
4.0000 mg | Freq: Once | INTRAMUSCULAR | Status: AC
  Administered 2019-01-29: 11:00:00 4 mg via INTRAVENOUS
  Filled 2019-01-29: qty 2

## 2019-01-29 MED ORDER — IOHEXOL 350 MG/ML SOLN
100.0000 mL | Freq: Once | INTRAVENOUS | Status: AC | PRN
  Administered 2019-01-29: 100 mL via INTRAVENOUS

## 2019-01-29 MED ORDER — ALBUTEROL SULFATE HFA 108 (90 BASE) MCG/ACT IN AERS
2.0000 | INHALATION_SPRAY | Freq: Once | RESPIRATORY_TRACT | Status: AC
  Administered 2019-01-29: 2 via RESPIRATORY_TRACT
  Filled 2019-01-29: qty 6.7

## 2019-01-29 MED ORDER — LORAZEPAM 2 MG/ML IJ SOLN
0.5000 mg | Freq: Once | INTRAMUSCULAR | Status: AC
  Administered 2019-01-29: 0.5 mg via INTRAVENOUS
  Filled 2019-01-29: qty 1

## 2019-01-29 NOTE — ED Notes (Signed)
Pt reports last ETOH use was last night.

## 2019-01-29 NOTE — ED Notes (Signed)
IV attempted x1 without success

## 2019-01-29 NOTE — ED Notes (Signed)
RN at bedside attempting US IV.

## 2019-01-29 NOTE — ED Provider Notes (Signed)
[facility] EMERGENCY DEPARTMENT Provider Note   CSN: [id number] Arrival date & time: 01/29/19  8657    History   Chief Complaint Chief Complaint  Patient presents with   Shortness of Breath    HPI Victoria Travis is a 53 y.o. female.     HPI  53yo female with history of hashimoto's thyroiditis, MDD, IBS, alcohol dependence (patient reports occasional etoh now), who presents with concern for shortness of breath.  Reports about one month ago she and her husband had cold like symptoms/flu like symptoms, 3 weeks ago was diagnosed with bronchitis, and over the last 2 weeks feels she has had worsening shortness of breath.  Reports shortness of breath for last 2 weeks with worsening over the last few days.  Reports shortness of breath worse laying down and with activity. Feels a burning pain in her lungs which is worse with deep breaths and has been present for days.  Denies recent fever, chills, cough. Does report coughing a few times but not significant. Feels like there is mucus to raise but she can't.  Reports dizziness and severe fatigue, sleeping  A lot.  Has nausea off and on, usually in association with things she eats.    No recent travel, immobilization, no asymmetric leg pain or swelling, not on estrogen, no recent surgeries, no hx of DVT/PE. No cardiac hx, denies hx of lung disease or smoking, does report hx of bronchitis for which she has been given albuterol.    Has been taking thyroid medications as prescribed. No known sick contacts other than symptoms one month ago shared by her and her husband.    Past Medical History:  Diagnosis Date   Alcohol dependence (HCC)    Depression    IBS (irritable bowel syndrome)    Thyroid disease     Patient Active Problem List   Diagnosis Date Noted   Alcohol dependence (HCC) 03/07/2018   MDD (major depressive disorder), severe (HCC) 03/06/2018   Post-surgical hypothyroidism 06/11/2012   Cellulitis of foot  06/11/2012    History reviewed. No pertinent surgical history.   OB History   No obstetric history on file.      Home Medications    Prior to Admission medications   Medication Sig Start Date End Date Taking? Authorizing Provider  albuterol (PROVENTIL HFA;VENTOLIN HFA) 108 (90 Base) MCG/ACT inhaler Inhale 2 puffs into the lungs every 4 (four) hours as needed for wheezing or shortness of breath (cough, shortness of breath or wheezing.). 12/12/18   Elvina Sidle, MD  dicyclomine (BENTYL) 10 MG capsule Take 10 mg by mouth 4 (four) times daily -  before meals and at bedtime.    [provider]  HYDROcodone-homatropine (HYDROMET) 5-1.5 MG/5ML syrup Take 5 mLs by mouth every 6 (six) hours as needed for cough. 12/12/18   Elvina Sidle, MD  levothyroxine (SYNTHROID, LEVOTHROID) 100 MCG tablet Take 1 tablet (100 mcg total) by mouth daily before breakfast. For hypothyroidism 06/26/18   Benjiman Core D, PA-C  magnesium gluconate (MAGONATE) 30 MG tablet Take 2 tablets (60 mg total) by mouth 2 (two) times daily. 10/13/18   Law, Waylan Boga, PA-C  ondansetron (ZOFRAN) 4 MG tablet Take 1 tablet (4 mg total) by mouth every 8 (eight) hours as needed for nausea or vomiting. 09/27/18   Georgina Quint, MD  potassium chloride (K-DUR) 10 MEQ tablet Take 1 tablet (10 mEq total) by mouth daily. 10/13/18   Law, Waylan Boga, PA-C  predniSONE (DELTASONE) 20 MG  tablet 2 daily with food 12/12/18   Elvina SidleLauenstein, Kurt, MD    Family History No family history on file.  Social History Social History   Tobacco Use   Smoking status: Former Smoker   Smokeless tobacco: Never Used  Substance Use Topics   Alcohol use: Yes    Alcohol/week: 14.0 standard drinks    Types: 14 Shots of liquor per week    Comment: 1/2 gallon vodka every 9 days - drinks daily - 4 large drinks   Drug use: No     Allergies   Patient has no known allergies.   Review of Systems Review of Systems  Constitutional:  Positive for fatigue. Negative for fever.  HENT: Negative for congestion.   Eyes: Negative for visual disturbance.  Respiratory: Positive for shortness of breath. Negative for cough.   Cardiovascular: Positive for chest pain. Negative for leg swelling.  Gastrointestinal: Positive for nausea. Negative for abdominal pain, diarrhea and vomiting.  Genitourinary: Negative for dysuria.  Musculoskeletal: Myalgias: occasional cramping in bilateral lower legs.  Skin: Negative for rash.  Neurological: Positive for light-headedness.     Physical Exam Updated Vital Signs BP (!) 127/104    Pulse 81    Temp 98.5 F (36.9 C) (Oral)    Resp 18    LMP 01/22/2019    SpO2 96%   Physical Exam Vitals signs and nursing note reviewed.  Constitutional:      General: She is not in acute distress.    Appearance: She is well-developed. She is not diaphoretic.     Comments: anxious  HENT:     Head: Normocephalic and atraumatic.  Eyes:     Conjunctiva/sclera: Conjunctivae normal.  Neck:     Musculoskeletal: Normal range of motion.     Comments: No JVD  Cardiovascular:     Rate and Rhythm: Normal rate and regular rhythm.     Heart sounds: Normal heart sounds. No murmur. No friction rub. No gallop.   Pulmonary:     Effort: Pulmonary effort is normal. No respiratory distress.     Breath sounds: Normal breath sounds. No wheezing or rales.  Musculoskeletal:        General: No tenderness.  Skin:    General: Skin is warm and dry.     Findings: No erythema or rash.  Neurological:     Mental Status: She is alert and oriented to person, place, and time.      ED Treatments / Results  Labs (all labs ordered are listed, but only abnormal results are displayed) Labs Reviewed  CBC WITH DIFFERENTIAL/PLATELET - Abnormal; Notable for the following components:      Result Value   RDW 18.7 (*)    All other components within normal limits  COMPREHENSIVE METABOLIC PANEL - Abnormal; Notable for the following  components:   Potassium 3.4 (*)    CO2 20 (*)    Calcium 8.6 (*)    AST 181 (*)    ALT 113 (*)    Anion gap 17 (*)    All other components within normal limits  D-DIMER, QUANTITATIVE (NOT AT [facility]) - Abnormal; Notable for the following components:   D-Dimer, Quant 0.79 (*)    All other components within normal limits  TROPONIN I  BRAIN NATRIURETIC PEPTIDE    EKG EKG Interpretation  Date/Time:  Wednesday January 29 2019 09:46:37 EDT Ventricular Rate:  88 PR Interval:    QRS Duration: 85 QT Interval:  391 QTC Calculation: 474 R Axis:  42 Text Interpretation:  Sinus rhythm Abnormal R-wave progression, early transition No significant change since last tracing Confirmed by Alvira Monday ([id number]) on 01/29/2019 10:03:24 AM Also confirmed by Alvira Monday ([id number]), editor Barbette Hair [phone])  on 01/29/2019 10:14:20 AM   Radiology Ct Angio Chest Pe W And/or Wo Contrast  Result Date: 01/29/2019 CLINICAL DATA:  Shortness of breath EXAM: CT ANGIOGRAPHY CHEST WITH CONTRAST TECHNIQUE: Multidetector CT imaging of the chest was performed using the standard protocol during bolus administration of intravenous contrast. Multiplanar CT image reconstructions and MIPs were obtained to evaluate the vascular anatomy. CONTRAST:  OMNIPAQUE IOHEXOL 350 MG/ML SOLN COMPARISON:  Chest radiograph January 29, 2019 FINDINGS: Cardiovascular: There is no demonstrable pulmonary embolus. There is no thoracic aortic aneurysm or dissection. The visualized great vessels appear unremarkable. Note that the right innominate and left common carotid arteries arise as a common trunk, an anatomic variant. No pericardial effusion or pericardial thickening is evident. Mediastinum/Nodes: Thyroid essentially absent. There is no appreciable thoracic adenopathy. There is a small hiatal hernia. Lungs/Pleura: There is no edema or consolidation. No pleural effusion or pleural thickening evident. Upper Abdomen: There is diffuse hepatic  steatosis. Visualized upper abdominal structures otherwise appear unremarkable. Musculoskeletal: No blastic or lytic bone lesions. No evident chest wall lesions. Review of the MIP images confirms the above findings. IMPRESSION: 1. No demonstrable pulmonary embolus. No thoracic aortic aneurysm or dissection. 2.  Lungs clear. 3.  No appreciable thoracic adenopathy. 4.  Hepatic steatosis. 5.  Small hiatal hernia. Electronically Signed   By: Bretta Bang III M.D.   On: 01/29/2019 12:52   Dg Chest Portable 1 View  Result Date: 01/29/2019 CLINICAL DATA:  Shortness of breath and chest tightness EXAM: PORTABLE CHEST 1 VIEW COMPARISON:  None. FINDINGS: Lungs are clear. Heart size and pulmonary vascularity are normal. No adenopathy. No pneumothorax. No bone lesions. IMPRESSION: No edema or consolidation. Electronically Signed   By: Bretta Bang III M.D.   On: 01/29/2019 10:46    Procedures Procedures (including critical care time)  Medications Ordered in ED Medications  ondansetron (ZOFRAN) injection 4 mg (4 mg Intravenous Given 01/29/19 1033)  LORazepam (ATIVAN) injection 0.5 mg (0.5 mg Intravenous Given 01/29/19 1119)  iohexol (OMNIPAQUE) 350 MG/ML injection 100 mL (100 mLs Intravenous Contrast Given 01/29/19 1243)  albuterol (VENTOLIN HFA) 108 (90 Base) MCG/ACT inhaler 2 puff (2 puffs Inhalation Given 01/29/19 1355)     Initial Impression / Assessment and Plan / ED Course  I have reviewed the triage vital signs and the nursing notes.  Pertinent labs & imaging results that were available during my care of the patient were reviewed by me and considered in my medical decision making (see chart for details).        53yo female with history of hashimoto's thyroiditis, MDD, IBS, alcohol dependence (patient reports occasional etoh now), who presents with concern for shortness of breath for 2 weeks in setting of recent flu like illness one month ago.  DDx includes pneumonia, CHF, myocarditis,  PE, ACS, anemia, metabolic abnormality, COVID19, bronchitis, anxiety, etoh withdrawal.   EKG without significant findings. CBC shows no significant anemia.  Troponin negative after days of symptoms, doubt ACS.  DDimer positive and CT PE study shows no PE, no pneumonia, no other abnormalities.  No sign of thyroid emergency by vital signs. Labs show mild acidosis, mild elevation in AST/ALT, likely in setting of etoh use, dehydration. Discussed recommendation for etoh cessation.   She is hemodynamically stable, no hypoxia,  no tachypnea, dyspnea improved on reevaluation.  Possible bronchitis and suspect component of anxiety regarding symptoms. Recommend albuterol MDI, supportive care.  Discussed due to shortage of COVID19 tests, I am unable to test her today, however by her history symptoms one month ago were more consistent with this diagnosis than the symptoms she presents with today, and acute infection is less likely--however, recommend continued self-isolation. Patient discharged in stable condition with understanding of reasons to return.  Final Clinical Impressions(s) / ED Diagnoses   Final diagnoses:  Shortness of breath  Bronchitis    ED Discharge Orders    None       Alvira Monday, MD 01/29/19 2103

## 2019-01-29 NOTE — ED Notes (Signed)
ED Provider at bedside. 

## 2019-01-29 NOTE — ED Triage Notes (Signed)
SOB for 2 weeks. Mild cough. Pt is appears anxious.

## 2019-03-20 ENCOUNTER — Telehealth: Payer: Self-pay | Admitting: Physician Assistant

## 2019-03-20 NOTE — Telephone Encounter (Signed)
Please advise 

## 2019-03-24 NOTE — Telephone Encounter (Signed)
Pt would like to know why her refill has been declined. She has been furloughed so she is not able to come in and have labs.  Please call [phone]

## 2019-03-25 ENCOUNTER — Other Ambulatory Visit: Payer: Self-pay

## 2019-03-25 ENCOUNTER — Telehealth: Payer: Self-pay | Admitting: General Practice

## 2019-03-25 ENCOUNTER — Other Ambulatory Visit: Payer: Self-pay | Admitting: Physician Assistant

## 2019-03-25 DIAGNOSIS — R5383 Other fatigue: Secondary | ICD-10-CM

## 2019-03-25 DIAGNOSIS — E039 Hypothyroidism, unspecified: Secondary | ICD-10-CM

## 2019-03-25 DIAGNOSIS — E890 Postprocedural hypothyroidism: Secondary | ICD-10-CM

## 2019-03-25 NOTE — Telephone Encounter (Signed)
Medication Refill - Medication: levothyroxine (SYNTHROID, LEVOTHROID) 100 MCG tablet [[id number]]   Has the patient contacted their pharmacy? Yes.   (Agent:  (Agent: If yes, when and what did the pharmacy advise?) The patient understands that she needs a visit before she can get a refill but she can not afford it because she has been unemployed since [place] began and she just does not have the money. If she can get the medication refill, she would like it for 90 days so then maybe she will be back on her feet.   Preferred Pharmacy (with phone number or street name):  [facility] [place]. [phone] (Phone) [phone] (Fax)     Agent: Please be advised that RX refills may take up to 3 business days. We ask that you follow-up with your pharmacy.

## 2019-03-25 NOTE — Telephone Encounter (Signed)
Pt has appt tomorrow for labs at 9:20 am and est. Care appt with morrow on 04/16/2019 at 11:00am- pt aware of appts.  [facility] will refill synthroid when labs results are back- pt aware and agreeable. Dgaddy, CMA

## 2019-03-25 NOTE — Telephone Encounter (Signed)
Pt scheduled for labs tomorrow along with bp check.  Per lab results [facility] will refill medication (levothyroxine) and pt has been scheduled with morrow to est. Care and med refill on 04/16/2019 at 11 am.  Pt agreeable.  Pt worried about cost advised she can setup payment plan for cost of services-pt agreeable. Dgaddy, CMA

## 2019-03-26 ENCOUNTER — Ambulatory Visit: Payer: Self-pay | Admitting: Family Medicine

## 2019-03-26 ENCOUNTER — Other Ambulatory Visit: Payer: Self-pay

## 2019-03-26 ENCOUNTER — Ambulatory Visit (INDEPENDENT_AMBULATORY_CARE_PROVIDER_SITE_OTHER): Payer: Self-pay | Admitting: Family Medicine

## 2019-03-26 DIAGNOSIS — E890 Postprocedural hypothyroidism: Secondary | ICD-10-CM

## 2019-03-26 DIAGNOSIS — E039 Hypothyroidism, unspecified: Secondary | ICD-10-CM

## 2019-03-26 DIAGNOSIS — R5383 Other fatigue: Secondary | ICD-10-CM

## 2019-03-27 ENCOUNTER — Other Ambulatory Visit: Payer: Self-pay | Admitting: Family Medicine

## 2019-03-27 LAB — COMPREHENSIVE METABOLIC PANEL
ALT: 31 IU/L (ref 0–32)
AST: 28 IU/L (ref 0–40)
Albumin/Globulin Ratio: 1.6 (ref 1.2–2.2)
Albumin: 3.6 g/dL — ABNORMAL LOW (ref 3.8–4.9)
Alkaline Phosphatase: 52 IU/L (ref 39–117)
BUN/Creatinine Ratio: 18 (ref 9–23)
BUN: 11 mg/dL (ref 6–24)
Bilirubin Total: 0.3 mg/dL (ref 0.0–1.2)
CO2: 22 mmol/L (ref 20–29)
Calcium: 8.9 mg/dL (ref 8.7–10.2)
Chloride: 103 mmol/L (ref 96–106)
Creatinine, Ser: 0.62 mg/dL (ref 0.57–1.00)
GFR calc Af Amer: 121 mL/min/{1.73_m2} (ref >59)
GFR calc non Af Amer: 105 mL/min/{1.73_m2} (ref >59)
Globulin, Total: 2.3 g/dL (ref 1.5–4.5)
Glucose: 82 mg/dL (ref 65–99)
Potassium: 3.5 mmol/L (ref 3.5–5.2)
Sodium: 143 mmol/L (ref 134–144)
Total Protein: 5.9 g/dL — ABNORMAL LOW (ref 6.0–8.5)

## 2019-03-27 LAB — LIPID PANEL
Chol/HDL Ratio: 2.3 ratio (ref 0.0–4.4)
Cholesterol, Total: 174 mg/dL (ref 100–199)
HDL: 75 mg/dL (ref >39)
LDL Calculated: 86 mg/dL (ref 0–99)
Triglycerides: 64 mg/dL (ref 0–149)
VLDL Cholesterol Cal: 13 mg/dL (ref 5–40)

## 2019-03-27 LAB — TSH: TSH: 1.13 u[IU]/mL (ref 0.450–4.500)

## 2019-03-27 MED ORDER — LEVOTHYROXINE SODIUM 100 MCG PO TABS: 100.0000 ug | ORAL_TABLET | Freq: Every day | ORAL | 0 refills | Status: DC

## 2019-04-16 ENCOUNTER — Ambulatory Visit: Payer: Self-pay | Admitting: Family Medicine

## 2019-04-16 ENCOUNTER — Telehealth (INDEPENDENT_AMBULATORY_CARE_PROVIDER_SITE_OTHER): Payer: Self-pay | Admitting: Registered Nurse

## 2019-04-16 ENCOUNTER — Encounter: Payer: Self-pay | Admitting: Registered Nurse

## 2019-04-16 VITALS — Ht 66.0 in | Wt 144.0 lb

## 2019-04-16 DIAGNOSIS — Z7689 Persons encountering health services in other specified circumstances: Secondary | ICD-10-CM

## 2019-04-16 NOTE — Progress Notes (Signed)
Telemedicine Encounter- SOAP NOTE Established Patient  This telephone encounter was conducted with the patient's (or proxy's) verbal consent via audio telecommunications: yes   Patient was instructed to have this encounter in a suitably private space; and to only have persons present to whom they give permission to participate. In addition, patient identity was confirmed by use of name plus two identifiers (DOB and address).  I discussed the limitations, risks, security and privacy concerns of performing an evaluation and management service by telephone and the availability of in person appointments. I also discussed with the patient that there may be a patient responsible charge related to this service. The patient expressed understanding and agreed to proceed.  I spent a total of 16 minutes talking with the patient or their proxy.  Chief Complaint  Patient presents with  . Establish Care    Subjective   Victoria Travis is a 53 y.o. established patient. Telephone visit today for transfer of care to myself as PCP  HPI Pt has a history significant for Hashimoto thyroiditis with current postsurgical hypothyroidism. She also has a history of EtOH abuse, but states this is not currently an issue.  Hypothyroidism: taking 100mcg of levothyroxine PO qd with good effect. Had recent TSH - wnl.   No current health complaints - pt is currently working for [employer] after having been furloughed from job, this is frustrating for her. She is hoping to find work soon.  Patient Active Problem List   Diagnosis Date Noted  . Alcohol dependence (HCC) 03/07/2018  . MDD (major depressive disorder), severe (HCC) 03/06/2018  . 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 Cellulitis of foot 06/11/2012    Past Medical History:  Diagnosis Date  . Alcohol dependence (HCC)   . Depression   . IBS (irritable bowel syndrome)   . Thyroid disease     Current Outpatient Medications  Medication Sig Dispense  Refill  . albuterol (PROVENTIL HFA;VENTOLIN HFA) 108 (90 Base) MCG/ACT inhaler Inhale 2 puffs into the lungs every 4 (four) hours as needed for wheezing or shortness of breath (cough, shortness of breath or wheezing.). 1 Inhaler 1  . dicyclomine (BENTYL) 10 MG capsule Take 10 mg by mouth 4 (four) times daily -  before meals and at bedtime.    Marland Kitchen. levothyroxine (SYNTHROID) 100 MCG tablet Take 1 tablet (100 mcg total) by mouth daily before breakfast. For hypothyroidism 90 tablet 0  . magnesium gluconate (MAGONATE) 30 MG tablet Take 2 tablets (60 mg total) by mouth 2 (two) times daily. 12 tablet 0  . ondansetron (ZOFRAN) 4 MG tablet Take 1 tablet (4 mg total) by mouth every 8 (eight) hours as needed for nausea or vomiting. 20 tablet 0  . potassium chloride (K-DUR) 10 MEQ tablet Take 1 tablet (10 mEq total) by mouth daily. 5 tablet 0  . HYDROcodone-homatropine (HYDROMET) 5-1.5 MG/5ML syrup Take 5 mLs by mouth every 6 (six) hours as needed for cough. (Patient not taking: Reported on 04/16/2019) 60 mL 0  . predniSONE (DELTASONE) 20 MG tablet 2 daily with food (Patient not taking: Reported on 04/16/2019) 10 tablet 1   No current facility-administered medications for this visit.     No Known Allergies  Social History   Socioeconomic History  . Marital status: Married    Spouse name: Not on file  . Number of children: Not on file  . Years of education: Not on file  . Highest education level: Not on file  Occupational History  . Not on  file  Social Needs  . Financial resource strain: Not hard at all  . Food insecurity    Worry: Never true    Inability: Never true  . Transportation needs    Medical: No    Non-medical: No  Tobacco Use  . Smoking status: Former Research scientist (life sciences)  . Smokeless tobacco: Never Used  Substance and Sexual Activity  . Alcohol use: Yes    Alcohol/week: 14.0 standard drinks    Types: 14 Shots of liquor per week    Comment: 1/2 gallon vodka every 9 days - drinks daily - 4 large  drinks  . Drug use: No  . Sexual activity: Not Currently  Lifestyle  . Physical activity    Days per week: 5 days    Minutes per session: 150+ min  . Stress: Only a little  Relationships  . Social Herbalist on phone: Three times a week    Gets together: Twice a week    Attends religious service: Patient refused    Active member of club or organization: Patient refused    Attends meetings of clubs or organizations: Patient refused    Relationship status: Patient refused  . Intimate partner violence    Fear of current or ex partner: No    Emotionally abused: No    Physically abused: No    Forced sexual activity: No  Other Topics Concern  . Not on file  Social History Narrative  . Not on file    Review of Systems  Constitutional: Negative.   HENT: Negative.   Eyes: Negative.   Respiratory: Negative.   Cardiovascular: Negative.   Gastrointestinal: Negative.   Genitourinary: Negative.   Musculoskeletal: Negative.   Skin: Negative.   Neurological: Negative.   Endo/Heme/Allergies: Negative.   Psychiatric/Behavioral: Negative.     Objective   Vitals as reported by the patient: Today's Vitals   04/16/19 0946  Weight: 144 lb (65.3 kg)  Height: 5\' 6"  (1.676 m)    Bay was seen today for establish care.  Diagnoses and all orders for this visit:  Encounter to establish care   PLAN:  Continue Synthroid at current dose of 52mcg PO qd. Six month supply given. Labs at that time. Visit sooner with any symptoms or concerns.  Patient encouraged to call clinic with any questions, comments, or concerns.    I discussed the assessment and treatment plan with the patient. The patient was provided an opportunity to ask questions and all were answered. The patient agreed with the plan and demonstrated an understanding of the instructions.   The patient was advised to call back or seek an in-person evaluation if the symptoms worsen or if the condition fails to  improve as anticipated.  I provided 16 minutes of non-face-to-face time during this encounter.  Maximiano Coss, NP  Primary [facility] at [facility]

## 2019-05-14 ENCOUNTER — Other Ambulatory Visit: Payer: Self-pay

## 2019-05-14 ENCOUNTER — Emergency Department (HOSPITAL_BASED_OUTPATIENT_CLINIC_OR_DEPARTMENT_OTHER): Payer: Self-pay

## 2019-05-14 ENCOUNTER — Encounter (HOSPITAL_BASED_OUTPATIENT_CLINIC_OR_DEPARTMENT_OTHER): Payer: Self-pay | Admitting: Adult Health

## 2019-05-14 ENCOUNTER — Emergency Department (HOSPITAL_BASED_OUTPATIENT_CLINIC_OR_DEPARTMENT_OTHER)
Admission: EM | Admit: 2019-05-14 | Discharge: 2019-05-14 | Discharge status: Against Medical Advice | Payer: Self-pay | Attending: Emergency Medicine | Admitting: Emergency Medicine

## 2019-05-14 DIAGNOSIS — Y9289 Other specified places as the place of occurrence of the external cause: Secondary | ICD-10-CM | POA: Insufficient documentation

## 2019-05-14 DIAGNOSIS — M542 Cervicalgia: Secondary | ICD-10-CM | POA: Insufficient documentation

## 2019-05-14 DIAGNOSIS — M545 Low back pain, unspecified: Secondary | ICD-10-CM

## 2019-05-14 DIAGNOSIS — Z87891 Personal history of nicotine dependence: Secondary | ICD-10-CM | POA: Insufficient documentation

## 2019-05-14 DIAGNOSIS — Y9301 Activity, walking, marching and hiking: Secondary | ICD-10-CM | POA: Insufficient documentation

## 2019-05-14 DIAGNOSIS — Z0279 Encounter for issue of other medical certificate: Secondary | ICD-10-CM | POA: Insufficient documentation

## 2019-05-14 DIAGNOSIS — W108XXA Fall (on) (from) other stairs and steps, initial encounter: Secondary | ICD-10-CM | POA: Insufficient documentation

## 2019-05-14 DIAGNOSIS — Y998 Other external cause status: Secondary | ICD-10-CM | POA: Insufficient documentation

## 2019-05-14 DIAGNOSIS — Z79899 Other long term (current) drug therapy: Secondary | ICD-10-CM | POA: Insufficient documentation

## 2019-05-14 DIAGNOSIS — Z5320 Procedure and treatment not carried out because of patient's decision for unspecified reasons: Secondary | ICD-10-CM | POA: Insufficient documentation

## 2019-05-14 MED ORDER — ACETAMINOPHEN 500 MG PO TABS
1000.0000 mg | ORAL_TABLET | Freq: Once | ORAL | Status: AC
  Administered 2019-05-14: 1000 mg via ORAL
  Filled 2019-05-14: qty 2

## 2019-05-14 NOTE — ED Provider Notes (Signed)
[facility] EMERGENCY DEPARTMENT Provider Note   CSN: [id number] Arrival date & time: 05/14/19  1924    History   Chief Complaint Chief Complaint  Patient presents with  . Fall    HPI Victoria Travis is a 53 y.o. female aspects of alcohol dependence, depression, IBS, thyroid disease who presents for evaluation of neck and back pain that began 4 days ago after mechanical fall.  Patient reports that she was helping her sister pain and slipped on a plastic cheek of her, causing her to fall down approximately 6 steps.  She states that she hit her lower back on the fall.  No head injury, LOC.  Since then, she has had pain in her lower back as well as neck.  She has not been taking anything for pain.  She states that symptoms are worse with movement.  She describes feeling "sore" everywhere.  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  No new trauma, injury, fall.  Patient states she denies any history of back surgery.  She denies any numbness/weakness of her arms or legs, urinary or bowel incontinence, saddle anesthesia.     The history is provided by the patient.    Past Medical History:  Diagnosis Date  . Alcohol dependence ([place])   . Depression   . IBS (irritable bowel syndrome)   . Thyroid disease     Patient Active Problem List   Diagnosis Date Noted  . Alcohol dependence ([place]) 03/07/2018  . MDD (major depressive disorder), severe ([place]) 03/06/2018  . 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 Cellulitis of foot 06/11/2012    History reviewed. No pertinent surgical history.   OB History   No obstetric history on file.      Home Medications    Prior to Admission medications   Medication Sig Start Date End Date Taking? Authorizing Provider  albuterol (PROVENTIL HFA;VENTOLIN HFA) 108 (90 Base) MCG/ACT inhaler Inhale 2 puffs into the lungs every 4 (four) hours as needed for  wheezing or shortness of breath (cough, shortness of breath or wheezing.). 12/12/18   Robyn Haber, MD  dicyclomine (BENTYL) 10 MG capsule Take 10 mg by mouth 4 (four) times daily -  before meals and at bedtime.    [provider]  HYDROcodone-homatropine (HYDROMET) 5-1.5 MG/5ML syrup Take 5 mLs by mouth every 6 (six) hours as needed for cough. Patient not taking: Reported on 04/16/2019 12/12/18   Robyn Haber, MD  levothyroxine (SYNTHROID) 100 MCG tablet Take 1 tablet (100 mcg total) by mouth daily before breakfast. For hypothyroidism 03/27/19   Forrest Moron, MD  magnesium gluconate (MAGONATE) 30 MG tablet Take 2 tablets (60 mg total) by mouth 2 (two) times daily. 10/13/18   Law, Bea Graff, PA-C  ondansetron (ZOFRAN) 4 MG tablet Take 1 tablet (4 mg total) by mouth every 8 (eight) hours as needed for nausea or vomiting. 09/27/18   Horald Pollen, MD  potassium chloride (K-DUR) 10 MEQ tablet Take 1 tablet (10 mEq total) by mouth daily. 10/13/18   Law, Bea Graff, PA-C  predniSONE (DELTASONE) 20 MG tablet 2 daily with food Patient not taking: Reported on 04/16/2019 12/12/18   Robyn Haber, MD    Family History History reviewed. No pertinent family history.  Social History Social History   Tobacco Use  . Smoking status: Former Research scientist (life sciences)  . Smokeless tobacco: Never Used  Substance Use Topics  .  Alcohol use: Yes    Alcohol/week: 14.0 standard drinks    Types: 14 Shots of liquor per week    Comment: 1/2 gallon vodka every 9 days - drinks daily - 4 large drinks  . Drug use: No     Allergies   Patient has no known allergies.   Review of Systems Review of Systems  Musculoskeletal: Positive for back pain and neck pain.  Neurological: Negative for weakness and numbness.  All other systems reviewed and are negative.    Physical Exam Updated Vital Signs BP (!) 150/110   Pulse 72   Temp 98.8 F (37.1 C) (Oral)   Resp 18   Ht 5\' 7"  (1.702 m)   Wt 65 kg   LMP  05/13/2019   SpO2 96%   BMI 22.44 kg/m   Physical Exam Vitals signs and nursing note reviewed.  Constitutional:      Appearance: She is well-developed.  HENT:     Head: Normocephalic and atraumatic.  Eyes:     General: No scleral icterus.       Right eye: No discharge.        Left eye: No discharge.     Conjunctiva/sclera: Conjunctivae normal.  Neck:      Comments: Full flexion/extension and lateral movement of neck fully intact. No bony midline tenderness.  Diffuse muscular tenderness noted to bilateral paraspinals.  No deformities or crepitus.  Pulmonary:     Effort: Pulmonary effort is normal.  Musculoskeletal:     Thoracic back: She exhibits no tenderness.     Lumbar back: She exhibits tenderness.       Back:     Comments: No midline T-spine tenderness.  Diffuse lumbar tenderness overlying the entire lumbar region that extends over the midline.  No deformity or crepitus noted.  Skin:    General: Skin is warm and dry.  Neurological:     Mental Status: She is alert.     Comments: Follows commands, Moves all extremities  5/5 strength to BUE and BLE  Sensation intact throughout all major nerve distributions  Psychiatric:        Speech: Speech normal.        Behavior: Behavior normal.      ED Treatments / Results  Labs (all labs ordered are listed, but only abnormal results are displayed) Labs Reviewed - No data to display  EKG None  Radiology No results found.  Procedures Procedures (including critical care time)  Medications Ordered in ED Medications  acetaminophen (TYLENOL) tablet 1,000 mg (1,000 mg Oral Given 05/14/19 2018)     Initial Impression / Assessment and Plan / ED Course  I have reviewed the triage vital signs and the nursing notes.  Pertinent labs & imaging results that were available during my care of the patient were reviewed by me and considered in my medical decision making (see chart for details).        53 year old female who  presents for evaluation of neck and low back pain after mechanical fall that occurred approximately 6 days ago.  No head injury, LOC.  No numbness/weakness of arms or legs, saddle anesthesia, urinary or bowel.  Reports since then has had pain to neck and back.  Reports pain is worse with movement.  Has still been able to ambulate. Patient is afebrile, non-toxic appearing, sitting comfortably on examination table. Vital signs reviewed and stable.  On exam, she has diffuse paraspinal tenderness noted to the neck with no midline tenderness.  Full  range of motion of neck with any difficulty.  She has diffuse tenderness over the lumbar region that extends over the midline.  No deformity or crepitus noted.  Normal neuro exam.  I discussed treatment options with patient and offered both imaging of neck and lower back.  Patient initially declined all imaging.  I discussed with her given tenderness over lower lumbar region that we could do an imaging given history of trauma fall.  Patient is agreeable.  RN inform me why was in with another patient that patient was wanting to leave and did not want any further work-up here in the ED.  Patient requesting a work note.  I did not get a chance to discuss with patient.  Work note provided.  Portions of this note were generated with Scientist, clinical (histocompatibility and immunogenetics)Dragon dictation software. Dictation errors may occur despite best attempts at proofreading.   Final Clinical Impressions(s) / ED Diagnoses   Final diagnoses:  Acute bilateral low back pain, unspecified whether sciatica present    ED Discharge Orders    None       Rosana HoesLayden, Lindsey A, PA-C 05/14/19 2230    Gwyneth SproutPlunkett, Whitney, MD 05/14/19 [id number]

## 2019-05-14 NOTE — ED Notes (Signed)
PT reportsed that she did not want her x-rays and that she needed to go. She requested a work note. She also requested a permanent work note for light duty. Rn explained that we could give her a work note for a few days but we can not write a permanent work note and that she would need to follow up with primary care

## 2019-05-14 NOTE — ED Triage Notes (Signed)
Presents post fall down 5 stairs 5 days ago. Slid down on her back. She endorses cervical pain with no c-spine tenderness, mid back pain with paraspinous tenderness and lower back pain on the right. Ambulatory,. Ibuprofen is mnot helping. She works for [employer] and she has made the pain worse by going to work, she unable to do her job due to pain.

## 2019-05-16 ENCOUNTER — Ambulatory Visit (INDEPENDENT_AMBULATORY_CARE_PROVIDER_SITE_OTHER): Payer: Self-pay

## 2019-05-16 ENCOUNTER — Other Ambulatory Visit: Payer: Self-pay

## 2019-05-16 ENCOUNTER — Encounter: Payer: Self-pay | Admitting: Family Medicine

## 2019-05-16 ENCOUNTER — Ambulatory Visit (INDEPENDENT_AMBULATORY_CARE_PROVIDER_SITE_OTHER): Payer: Self-pay | Admitting: Family Medicine

## 2019-05-16 VITALS — BP 139/90 | HR 92 | Temp 98.7°F | Ht 67.0 in | Wt 143.6 lb

## 2019-05-16 DIAGNOSIS — M549 Dorsalgia, unspecified: Secondary | ICD-10-CM

## 2019-05-16 DIAGNOSIS — W108XXA Fall (on) (from) other stairs and steps, initial encounter: Secondary | ICD-10-CM

## 2019-05-16 MED ORDER — TRAMADOL HCL 50 MG PO TABS: 50.0000 mg | ORAL_TABLET | Freq: Three times a day (TID) | ORAL | 0 refills | Status: AC | PRN

## 2019-05-16 MED ORDER — CYCLOBENZAPRINE HCL 10 MG PO TABS: 10.0000 mg | ORAL_TABLET | Freq: Three times a day (TID) | ORAL | 0 refills | Status: DC | PRN

## 2019-05-16 MED ORDER — MELOXICAM 15 MG PO TABS: 15.0000 mg | ORAL_TABLET | Freq: Every day | ORAL | 0 refills | Status: DC

## 2019-05-16 NOTE — Progress Notes (Signed)
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 Fall    back injury due to fall down stairs. Needs to able to give her job reason for transfer to another dept due to the pain. She cannot take oxycodone. Just requesting muscle relaxer    HPI:   Patient is a 53 y.o. female with past medical history significant for alcohol dependence, depression, IBS, hypothyroidism,  who presents today for back pain  She was helping her sister paint her house a week ago Stairs were covered with plastic protection sheets She slipped down about 5-6 steps She works at [employer] and has not been able to complete a shift given her pain She has struggled lifting 20 lbs, much less larger boxes Neck towards shoulder and across low back are areas of most pain Having tingling of right hand fingers, low back pain radiates down into right buttock Denies any changes to bowel or bladder function Pain brings her to tears Went to ER 2 days hoping to xrays Has been taking ibuprofen not helping She has been icing She has been trying to get into a different department  Required needs to lift 50-70 lbs  Depression screen [facility] 2/9 05/16/2019 04/16/2019 09/27/2018  Decreased Interest 0 0 0  Down, Depressed, Hopeless 0 0 0  PHQ - 2 Score 0 0 0    Fall Risk  05/16/2019 09/27/2018 12/14/2017 09/14/2017 09/04/2017  Falls in the past year? 0 0 Yes No Yes  Number falls in past yr: 0 - - - 2 or more  Injury with Fall? 0 - - - Yes     No Known Allergies  Prior to Admission medications   Medication Sig Start Date End Date Taking? Authorizing Provider  levothyroxine (SYNTHROID) 100 MCG tablet Take 1 tablet (100 mcg total) by mouth daily before breakfast. For hypothyroidism 03/27/19  Yes Forrest Moron, MD    Past Medical History:  Diagnosis Date  . Alcohol dependence ([place])   . Depression   . IBS (irritable bowel syndrome)   . Thyroid disease     History  reviewed. No pertinent surgical history.  Social History   Tobacco Use  . Smoking status: Former Research scientist (life sciences)  . Smokeless tobacco: Never Used  Substance Use Topics  . Alcohol use: Yes    Alcohol/week: 14.0 standard drinks    Types: 14 Shots of liquor per week    Comment: 1/2 gallon vodka every 9 days - drinks daily - 4 large drinks    History reviewed. No pertinent family history.  ROS Per hpi  OBJECTIVE:  Today's Vitals   05/16/19 1005  BP: 139/90  Pulse: 92  Temp: 98.7 F (37.1 C)  TempSrc: Oral  SpO2: 97%  Weight: 143 lb 9.6 oz (65.1 kg)  Height: 5\' 7"  (1.702 m)   Body mass index is 22.49 kg/m.   Physical Exam Vitals signs and nursing note reviewed.  Constitutional:      Appearance: She is well-developed.  HENT:     Head: Normocephalic and atraumatic.     Mouth/Throat:     Pharynx: No oropharyngeal exudate.  Eyes:     General: No scleral icterus.    Conjunctiva/sclera: Conjunctivae normal.     Pupils: Pupils are equal, round, and reactive to light.  Neck:     Musculoskeletal: Normal range of motion and neck supple. Pain with movement and spinous process tenderness present. No muscular tenderness.  Cardiovascular:  Rate and Rhythm: Normal rate and regular rhythm.     Heart sounds: Normal heart sounds. No murmur. No friction rub. No gallop.   Pulmonary:     Effort: Pulmonary effort is normal.     Breath sounds: Normal breath sounds. No wheezing or rales.  Musculoskeletal:     Thoracic back: She exhibits bony tenderness. She exhibits normal range of motion and no spasm.     Lumbar back: She exhibits tenderness and bony tenderness. She exhibits no spasm.  Skin:    General: Skin is warm and dry.  Neurological:     Mental Status: She is alert and oriented to person, place, and time.     Sensory: Sensation is intact.     Motor: Motor function is intact.     Deep Tendon Reflexes: Reflexes are normal and symmetric.     No results found for this or any  previous visit (from the past 24 hour(s)).  Dg Cervical Spine Complete  Result Date: 05/16/2019 CLINICAL DATA:  Neck pain since a fall down 5 steps 5 days ago. Initial encounter. EXAM: CERVICAL SPINE - COMPLETE 4+ VIEW COMPARISON:  None. FINDINGS: No fracture is identified. Trace retrolisthesis C4 on C5 and C5 on C6 noted. There is loss of disc space height from C4 to C7. Facet degenerative change appears worst at C7-T1. Uncovertebral spurring is present at C4-5, C5-6 and C6-7 resulting in some foraminal narrowing at these levels. Prevertebral soft tissues are normal. Lung apices are clear. IMPRESSION: No acute disease. Cervical spondylosis most notable from C4-C7. Electronically Signed   By: Drusilla Kannerhomas  Dalessio M.D.   On: 05/16/2019 10:42   Dg Thoracic Spine 2 View  Result Date: 05/16/2019 CLINICAL DATA:  Thoracic spine pain since a fall down 5 stairs 5 days ago. Initial encounter. EXAM: THORACIC SPINE 2 VIEWS COMPARISON:  None. FINDINGS: There is no evidence of thoracic spine fracture. Alignment is normal. No other significant bone abnormalities are identified. IMPRESSION: Negative exam. Electronically Signed   By: Drusilla Kannerhomas  Dalessio M.D.   On: 05/16/2019 10:43   Dg Lumbar Spine 2-3 Views  Result Date: 05/16/2019 CLINICAL DATA:  Low back pain since a fall down 5 stairs 5 days ago. EXAM: LUMBAR SPINE - 2-3 VIEW COMPARISON:  None. FINDINGS: There is no evidence of lumbar spine fracture. Alignment is normal. Mild convex left curvature noted. Intervertebral disc spaces are maintained. IMPRESSION: No acute abnormality. Electronically Signed   By: Drusilla Kannerhomas  Dalessio M.D.   On: 05/16/2019 10:43     ASSESSMENT and PLAN  1. Acute midline back pain, unspecified back location Discussed supportive measures of heat and gentle stretching. New meds r/se/b reviewed. pmp reviewed. Work restrictions for not lifting more than 10lbs x 2 weeks.  - DG Cervical Spine Complete; Future - DG Thoracic Spine 2 View; Future - DG  Lumbar Spine 2-3 Views; Future  2. Fall down stairs, initial encounter - DG Cervical Spine Complete; Future - DG Thoracic Spine 2 View; Future - DG Lumbar Spine 2-3 Views; Future  Other orders - meloxicam (MOBIC) 15 MG tablet; Take 1 tablet (15 mg total) by mouth daily. - cyclobenzaprine (FLEXERIL) 10 MG tablet; Take 1 tablet (10 mg total) by mouth 3 (three) times daily as needed for muscle spasms. - traMADol (ULTRAM) 50 MG tablet; Take 1 tablet (50 mg total) by mouth every 8 (eight) hours as needed for up to 5 days.  Return in about 2 weeks (around 05/30/2019).    Myles LippsIrma M Santiago, MD [facility]  [facility] at [facility], [place] [address] Ph.  [phone] Fax [phone]

## 2019-05-16 NOTE — Patient Instructions (Signed)
Acute Back Pain, Adult Acute back pain is sudden and usually short-lived. It is often caused by an injury to the muscles and tissues in the back. The injury may result from:  A muscle or ligament getting overstretched or torn (strained). Ligaments are tissues that connect bones to each other. Lifting something improperly can cause a back strain.  Wear and tear (degeneration) of the spinal disks. Spinal disks are circular tissue that provides cushioning between the bones of the spine (vertebrae).  Twisting motions, such as while playing sports or doing yard work.  A hit to the back.  Arthritis. You may have a physical exam, lab tests, and imaging tests to find the cause of your pain. Acute back pain usually goes away with rest and home care. Follow these instructions at home: Managing pain, stiffness, and swelling  Take over-the-counter and prescription medicines only as told by your health care provider.  Your health care provider may recommend applying ice during the first 24-48 hours after your pain starts. To do this: ? Put ice in a plastic bag. ? Place a towel between your skin and the bag. ? Leave the ice on for 20 minutes, 2-3 times a day.  If directed, apply heat to the affected area as often as told by your health care provider. Use the heat source that your health care provider recommends, such as a moist heat pack or a heating pad. ? Place a towel between your skin and the heat source. ? Leave the heat on for 20-30 minutes. ? Remove the heat if your skin turns bright red. This is especially important if you are unable to feel pain, heat, or cold. You have a greater risk of getting burned. Activity   Do not stay in bed. Staying in bed for more than 1-2 days can delay your recovery.  Sit up and stand up straight. Avoid leaning forward when you sit, or hunching over when you stand. ? If you work at a desk, sit close to it so you do not need to lean over. Keep your chin tucked  in. Keep your neck drawn back, and keep your elbows bent at a right angle. Your arms should look like the letter "L." ? Sit high and close to the steering wheel when you drive. Add lower back (lumbar) support to your car seat, if needed.  Take short walks on even surfaces as soon as you are able. Try to increase the length of time you walk each day.  Do not sit, drive, or stand in one place for more than 30 minutes at a time. Sitting or standing for long periods of time can put stress on your back.  Do not drive or use heavy machinery while taking prescription pain medicine.  Use proper lifting techniques. When you bend and lift, use positions that put less stress on your back: ? Bend your knees. ? Keep the load close to your body. ? Avoid twisting.  Exercise regularly as told by your health care provider. Exercising helps your back heal faster and helps prevent back injuries by keeping muscles strong and flexible.  Work with a physical therapist to make a safe exercise program, as recommended by your health care provider. Do any exercises as told by your physical therapist. Lifestyle  Maintain a healthy weight. Extra weight puts stress on your back and makes it difficult to have good posture.  Avoid activities or situations that make you feel anxious or stressed. Stress and anxiety increase muscle   tension and can make back pain worse. Learn ways to manage anxiety and stress, such as through exercise. General instructions  Sleep on a firm mattress in a comfortable position. Try lying on your side with your knees slightly bent. If you lie on your back, put a pillow under your knees.  Follow your treatment plan as told by your health care provider. This may include: ? Cognitive or behavioral therapy. ? Acupuncture or massage therapy. ? Meditation or yoga. Contact a health care provider if:  You have pain that is not relieved with rest or medicine.  You have increasing pain going down  into your legs or buttocks.  Your pain does not improve after 2 weeks.  You have pain at night.  You lose weight without trying.  You have a fever or chills. Get help right away if:  You develop new bowel or bladder control problems.  You have unusual weakness or numbness in your arms or legs.  You develop nausea or vomiting.  You develop abdominal pain.  You feel faint. Summary  Acute back pain is sudden and usually short-lived.  Use proper lifting techniques. When you bend and lift, use positions that put less stress on your back.  Take over-the-counter and prescription medicines and apply heat or ice as directed by your health care provider. This information is not intended to replace advice given to you by your health care provider. Make sure you discuss any questions you have with your health care provider. Document Released: 09/25/2005 Document Revised: 01/14/2019 Document Reviewed: 05/09/2017 Elsevier Patient Education  2020 [employer]  

## 2019-06-02 ENCOUNTER — Other Ambulatory Visit: Payer: Self-pay

## 2019-06-02 ENCOUNTER — Emergency Department
Admission: EM | Admit: 2019-06-02 | Discharge: 2019-06-02 | Disposition: A | Discharge status: Home / Self Care | Payer: Self-pay | Source: Home / Self Care | Attending: Family Medicine | Admitting: Family Medicine

## 2019-06-02 ENCOUNTER — Telehealth (INDEPENDENT_AMBULATORY_CARE_PROVIDER_SITE_OTHER): Payer: Self-pay | Admitting: Family Medicine

## 2019-06-02 DIAGNOSIS — R6889 Other general symptoms and signs: Secondary | ICD-10-CM

## 2019-06-02 DIAGNOSIS — R112 Nausea with vomiting, unspecified: Secondary | ICD-10-CM

## 2019-06-02 DIAGNOSIS — R509 Fever, unspecified: Secondary | ICD-10-CM

## 2019-06-02 DIAGNOSIS — R197 Diarrhea, unspecified: Secondary | ICD-10-CM

## 2019-06-02 DIAGNOSIS — Z20822 Contact with and (suspected) exposure to covid-19: Secondary | ICD-10-CM

## 2019-06-02 LAB — POCT RAPID STREP A (OFFICE): Rapid Strep A Screen: NEGATIVE

## 2019-06-02 NOTE — ED Provider Notes (Signed)
[facility]    CSN: [id number] Arrival date & time: 06/02/19  1551      History   Chief Complaint Chief Complaint  Patient presents with  . Fever  . Chills  . Nausea    HPI Naarah Borgerding is a 53 y.o. female.   Patient complains of fever/chills for 2 days.  She has had some nausea/vomiting and today had some loose stools.  She denies respiratory symptoms except sore throat.  The history is provided by the patient.    Past Medical History:  Diagnosis Date  . Alcohol dependence ([place])   . Depression   . IBS (irritable bowel syndrome)   . Thyroid disease     Patient Active Problem List   Diagnosis Date Noted  . Alcohol dependence ([place]) 03/07/2018  . MDD (major depressive disorder), severe ([place]) 03/06/2018  . 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 Cellulitis of foot 06/11/2012    No past surgical history on file.  OB History   No obstetric history on file.      Home Medications    Prior to Admission medications   Medication Sig Start Date End Date Taking? Authorizing Provider  cyclobenzaprine (FLEXERIL) 10 MG tablet Take 1 tablet (10 mg total) by mouth 3 (three) times daily as needed for muscle spasms. 05/16/19   Rutherford Guys, MD  levothyroxine (SYNTHROID) 100 MCG tablet Take 1 tablet (100 mcg total) by mouth daily before breakfast. For hypothyroidism 03/27/19   Forrest Moron, MD  meloxicam (MOBIC) 15 MG tablet Take 1 tablet (15 mg total) by mouth daily. 05/16/19   Rutherford Guys, MD    Family History No family history on file.  Social History Social History   Tobacco Use  . Smoking status: Former Research scientist (life sciences)  . Smokeless tobacco: Never Used  Substance Use Topics  . Alcohol use: Yes    Alcohol/week: 14.0 standard drinks    Types: 14 Shots of liquor per week    Comment: 1/2 gallon vodka every 9 days - drinks daily - 4 large drinks  . Drug use: No     Allergies   Patient has no known allergies.   Review of Systems Review of  Systems + sore throat No cough No pleuritic pain No wheezing No nasal congestion No post-nasal drainage No sinus pain/pressure No itchy/red eyes No earache No hemoptysis No SOB + fever, + chills + nausea + vomiting No abdominal pain + diarrhea No urinary symptoms No skin rash + fatigue No myalgias + arthralgias + headache    Physical Exam Triage Vital Signs ED Triage Vitals  Enc Vitals Group     BP 06/02/19 1627 (!) 148/106     Pulse Rate 06/02/19 1627 96     Resp 06/02/19 1627 18     Temp 06/02/19 1627 98.7 F (37.1 C)     Temp Source 06/02/19 1627 Oral     SpO2 06/02/19 1627 99 %     Weight 06/02/19 1628 143 lb 4.8 oz (65 kg)     Height 06/02/19 1628 5\' 7"  (1.702 m)     Head Circumference --      Peak Flow --      Pain Score --      Pain Loc --      Pain Edu? --      Excl. in [place]? --    No data found.  Updated Vital Signs BP (!) 148/106 (BP Location: Right Arm)   Pulse 96   Temp  98.7 F (37.1 C) (Oral)   Resp 18   Ht 5\' 7"  (1.702 m)   Wt 65 kg   LMP 05/13/2019   SpO2 99%   BMI 22.44 kg/m   Visual Acuity Right Eye Distance:   Left Eye Distance:   Bilateral Distance:    Right Eye Near:   Left Eye Near:    Bilateral Near:     Physical Exam Nursing notes and Vital Signs reviewed. Appearance:  Patient appears stated age, and in no acute distress Eyes:  Pupils are equal, round, and reactive to light and accomodation.  Extraocular movement is intact.  Conjunctivae are not inflamed  Ears:  Canals normal.  Tympanic membranes normal.  Nose:  Normal turbinates.  No sinus tenderness.  Pharynx:  Mildly erythematous uvula. Neck:  Supple. Mild tenderness tonsillar nodes.  Lungs:  Clear to auscultation.  Breath sounds are equal.  Moving air well. Heart:  Regular rate and rhythm without murmurs, rubs, or gallops.  Abdomen:  Nontender without masses or hepatosplenomegaly.  Bowel sounds are present.  No CVA or flank tenderness.  Extremities:  No edema.   Skin:  No rash present.    UC Treatments / Results  Labs (all labs ordered are listed, but only abnormal results are displayed) Labs Reviewed  NOVEL CORONAVIRUS, NAA  STREP A DNA PROBE  POCT RAPID STREP A (OFFICE) negative    EKG   Radiology No results found.  Procedures Procedures (including critical care time)  Medications Ordered in UC Medications - No data to display  Initial Impression / Assessment and Plan / UC Course  I have reviewed the triage vital signs and the nursing notes.  Pertinent labs & imaging results that were available during my care of the patient were reviewed by me and considered in my medical decision making (see chart for details).    Suspect viral gastroenteritis. Throat culture pending. COVID19 test pending. Followup with Family Doctor if not improved in one week.   Final Clinical Impressions(s) / UC Diagnoses   Final diagnoses:  Fever, unspecified  Nausea vomiting and diarrhea     Discharge Instructions     Begin clear liquids (Pedialyte while having diarrhea) until improved, then advance to a [employer] diet (Bananas, Rice, Applesauce, Toast).  Then gradually resume a regular diet when tolerated.  Avoid milk products until well.  May continue Zofran as needed for nausea.  If symptoms become significantly worse during the night or over the weekend, proceed to the local emergency room.     ED Prescriptions    None        Lattie HawBeese, Truc Winfree A, MD 06/05/19 1756

## 2019-06-02 NOTE — ED Triage Notes (Signed)
Pt c/o fever and chills x 2 days. Some nausea and vomiting which she says happens whenever she is dehydrated. Went to PCP office earlier today for other CC and was dismissed due to having fever. (99.4) BP is has also been elevated which is abnormal for her.

## 2019-06-02 NOTE — Discharge Instructions (Addendum)
Begin clear liquids (Pedialyte while having diarrhea) until improved, then advance to a [employer] (Bananas, Rice, Applesauce, Toast).  Then gradually resume a regular diet when tolerated.  Avoid milk products until well.  May continue Zofran as needed for nausea.  If symptoms become significantly worse during the night or over the weekend, proceed to the local emergency room.

## 2019-06-02 NOTE — Progress Notes (Signed)
Pt was switched to telemed due to fever increasing from 99.4. Says she was not feeling well yesterday. Following up on back pain from fall. Pt says she is not longer taking the flexeril, does not like the way it makes her feel.

## 2019-06-02 NOTE — Progress Notes (Signed)
   Virtual Visit Note  I connected with patient on 06/02/19 at 337pm by phone and verified that I am speaking with the correct person using two identifiers. Victoria Travis is currently located at car and patient is currently with them during visit. The provider, Rutherford Guys, MD is located in their office at time of visit.  I discussed the limitations, risks, security and privacy concerns of performing an evaluation and management service by telephone and the availability of in person appointments. I also discussed with the patient that there may be a patient responsible charge related to this service. The patient expressed understanding and agreed to proceed.   CC: back pain/covid sx  HPI ? Seen on 05/16/2019 for back pain after fall Back is doing much better and ready to return to work Started not feeling well yesterday, vomiting, diarrhea, malaise, body aches, sore throat, felt feverish, temp 98.4 Has had mild cough, no SOB, no changes in taste or smell She is going to [place] for covid testing Has no known exposure Here in clinic 99.4  No Known Allergies  Prior to Admission medications   Medication Sig Start Date End Date Taking? Authorizing Provider  cyclobenzaprine (FLEXERIL) 10 MG tablet Take 1 tablet (10 mg total) by mouth 3 (three) times daily as needed for muscle spasms. 05/16/19   Rutherford Guys, MD  levothyroxine (SYNTHROID) 100 MCG tablet Take 1 tablet (100 mcg total) by mouth daily before breakfast. For hypothyroidism 03/27/19   Forrest Moron, MD  meloxicam (MOBIC) 15 MG tablet Take 1 tablet (15 mg total) by mouth daily. 05/16/19   Rutherford Guys, MD    Past Medical History:  Diagnosis Date  . Alcohol dependence ([place])   . Depression   . IBS (irritable bowel syndrome)   . Thyroid disease     No past surgical history on file.  Social History   Tobacco Use  . Smoking status: Former Research scientist (life sciences)  . Smokeless tobacco: Never Used  Substance Use Topics  . Alcohol  use: Yes    Alcohol/week: 14.0 standard drinks    Types: 14 Shots of liquor per week    Comment: 1/2 gallon vodka every 9 days - drinks daily - 4 large drinks    No family history on file.  ROS Per hpi  Objective  Vitals as reported by the patient: above   ASSESSMENT and PLAN  1. Suspected Covid-19 Virus Infection Patient evaluated, sent for testing and sent home with instructions for home care and Quarantine. Instructed to seek further care if symptoms worsen.  - Novel Coronavirus, NAA ([facility])  FOLLOW-UP: 1 week   The above assessment and management plan was discussed with the patient. The patient verbalized understanding of and has agreed to the management plan. Patient is aware to call the clinic if symptoms persist or worsen. Patient is aware when to return to the clinic for a follow-up visit. Patient educated on when it is appropriate to go to the emergency department.    I provided 10  minutes of non-face-to-face time during this encounter.  Rutherford Guys, MD [facility] at [place], [place] [address] Ph.  [phone] Fax [phone]

## 2019-06-04 LAB — STREP A DNA PROBE: Group A Strep Probe: NOT DETECTED

## 2019-06-04 LAB — NOVEL CORONAVIRUS, NAA: SARS-CoV-2, NAA: NOT DETECTED

## 2019-06-09 ENCOUNTER — Telehealth (INDEPENDENT_AMBULATORY_CARE_PROVIDER_SITE_OTHER): Payer: Self-pay | Admitting: Family Medicine

## 2019-06-09 ENCOUNTER — Other Ambulatory Visit: Payer: Self-pay

## 2019-06-09 DIAGNOSIS — M549 Dorsalgia, unspecified: Secondary | ICD-10-CM

## 2019-06-09 DIAGNOSIS — Z20822 Contact with and (suspected) exposure to covid-19: Secondary | ICD-10-CM

## 2019-06-09 NOTE — Progress Notes (Signed)
   Virtual Visit Note  I connected with patient on 06/09/19 at 513pm by phone and verified that I am speaking with the correct person using two identifiers. Victoria Travis is currently located at home and patient is currently with them during visit. The provider, Rutherford Guys, MD is located in their office at time of visit.  I discussed the limitations, risks, security and privacy concerns of performing an evaluation and management service by telephone and the availability of in person appointments. I also discussed with the patient that there may be a patient responsible charge related to this service. The patient expressed understanding and agreed to proceed.   CC: return to work  HPI ? Seen by me on 05/16/2019 for acute back pain after falling down the stairs  Then seen in ER on 06/02/2019 for suspected viral GE.  Fever started on aug 21st Neg covid test She quarantined as instructed  She reports that she is actually doing really well Her back fells great, doing strecthing She resolved her fever and GI sx more than 3 days ago  She is requesting release for work  No Known Allergies  Prior to Admission medications   Medication Sig Start Date End Date Taking? Authorizing Provider  cyclobenzaprine (FLEXERIL) 10 MG tablet Take 1 tablet (10 mg total) by mouth 3 (three) times daily as needed for muscle spasms. 05/16/19  Yes Rutherford Guys, MD  levothyroxine (SYNTHROID) 100 MCG tablet Take 1 tablet (100 mcg total) by mouth daily before breakfast. For hypothyroidism 03/27/19  Yes Stallings, Zoe A, MD  meloxicam (MOBIC) 15 MG tablet Take 1 tablet (15 mg total) by mouth daily. 05/16/19  Yes Rutherford Guys, MD    Past Medical History:  Diagnosis Date  . Alcohol dependence ([place])   . Depression   . IBS (irritable bowel syndrome)   . Thyroid disease     No past surgical history on file.  Social History   Tobacco Use  . Smoking status: Former Research scientist (life sciences)  . Smokeless tobacco: Never Used   Substance Use Topics  . Alcohol use: Yes    Alcohol/week: 14.0 standard drinks    Types: 14 Shots of liquor per week    Comment: 1/2 gallon vodka every 9 days - drinks daily - 4 large drinks    No family history on file.  ROS Per hpi  Objective  Vitals as reported by the patient: none   ASSESSMENT and PLAN  1. Covid-19 Virus not Detected Neg covid test. Sx started 10 days ago. Has been sx free for more than 3 days. Able to return to work per cdc guidelines.  2. Acute midline back pain, unspecified back location Resolved. Continue with healthy back LFM. Able to return to work without restrictions.  FOLLOW-UP: prn   The above assessment and management plan was discussed with the patient. The patient verbalized understanding of and has agreed to the management plan. Patient is aware to call the clinic if symptoms persist or worsen. Patient is aware when to return to the clinic for a follow-up visit. Patient educated on when it is appropriate to go to the emergency department.    I provided 10 minutes of non-face-to-face time during this encounter.  Rutherford Guys, MD Primary Care at [place], [place] [address] Ph.  [phone] Fax [phone]

## 2019-06-09 NOTE — Progress Notes (Signed)
Pt is reporting that she is doing 100% better, she is ready to return bk to wk. She is still having some minor aches and pain, takes otc for that and does stretches

## 2019-07-08 ENCOUNTER — Other Ambulatory Visit: Payer: Self-pay | Admitting: Family Medicine

## 2019-07-08 NOTE — Telephone Encounter (Signed)
Requested medication (s) are due for refill today: yes  Requested medication (s) are on the active medication list: yes  Last refill:  03/27/2019.  Future visit scheduled: no  Notes to clinic: ordering provider and pcp are different    Requested Prescriptions  Pending Prescriptions Disp Refills   levothyroxine (SYNTHROID) 100 MCG tablet [Pharmacy Med Name: Levothyroxine Sodium 100 MCG Oral Tablet] 90 tablet 0    Sig: TAKE 1 TABLET BY MOUTH ONCE DAILY BEFORE BREAKFAST FOR  HYPOTHYROIDISM     Endocrinology:  Hypothyroid Agents Failed - 07/08/2019  2:17 PM      Failed - TSH needs to be rechecked within 3 months after an abnormal result. Refill until TSH is due.      Passed - TSH in normal range and within 360 days    TSH  Date Value Ref Range Status  03/26/2019 1.130 0.450 - 4.500 uIU/mL Final         Passed - Valid encounter within last 12 months    Recent Outpatient Visits          4 weeks ago Acute midline back pain, unspecified back location   [facility], Lilia Argue, MD   1 month ago Suspected Covid-19 Virus Infection   [facility], Lilia Argue, MD   1 month ago Acute midline back pain, unspecified back location   [facility], Lilia Argue, MD   2 months ago Encounter to establish care   [facility], NP   3 months ago Hypothyroidism, unspecified type   [facility] at [facility], Rex Kras, MD

## 2019-08-02 ENCOUNTER — Emergency Department (HOSPITAL_BASED_OUTPATIENT_CLINIC_OR_DEPARTMENT_OTHER): Payer: Self-pay

## 2019-08-02 ENCOUNTER — Other Ambulatory Visit: Payer: Self-pay

## 2019-08-02 ENCOUNTER — Encounter (HOSPITAL_BASED_OUTPATIENT_CLINIC_OR_DEPARTMENT_OTHER): Payer: Self-pay | Admitting: Emergency Medicine

## 2019-08-02 ENCOUNTER — Emergency Department (HOSPITAL_BASED_OUTPATIENT_CLINIC_OR_DEPARTMENT_OTHER)
Admission: EM | Admit: 2019-08-02 | Discharge: 2019-08-02 | Disposition: A | Discharge status: Home / Self Care | Payer: Self-pay | Attending: Emergency Medicine | Admitting: Emergency Medicine

## 2019-08-02 DIAGNOSIS — Z79899 Other long term (current) drug therapy: Secondary | ICD-10-CM | POA: Insufficient documentation

## 2019-08-02 DIAGNOSIS — N3000 Acute cystitis without hematuria: Secondary | ICD-10-CM | POA: Insufficient documentation

## 2019-08-02 DIAGNOSIS — E063 Autoimmune thyroiditis: Secondary | ICD-10-CM | POA: Insufficient documentation

## 2019-08-02 DIAGNOSIS — R0602 Shortness of breath: Secondary | ICD-10-CM | POA: Insufficient documentation

## 2019-08-02 DIAGNOSIS — Z87891 Personal history of nicotine dependence: Secondary | ICD-10-CM | POA: Insufficient documentation

## 2019-08-02 LAB — CBC WITH DIFFERENTIAL/PLATELET
Abs Immature Granulocytes: 0.03 10*3/uL (ref 0.00–0.07)
Basophils Absolute: 0 10*3/uL (ref 0.0–0.1)
Basophils Relative: 0 %
Eosinophils Absolute: 0 10*3/uL (ref 0.0–0.5)
Eosinophils Relative: 0 %
HCT: 39.7 % (ref 36.0–46.0)
Hemoglobin: 12.5 g/dL (ref 12.0–15.0)
Immature Granulocytes: 0 %
Lymphocytes Relative: 19 %
Lymphs Abs: 1.5 10*3/uL (ref 0.7–4.0)
MCH: 30 pg (ref 26.0–34.0)
MCHC: 31.5 g/dL (ref 30.0–36.0)
MCV: 95.2 fL (ref 80.0–100.0)
Monocytes Absolute: 0.4 10*3/uL (ref 0.1–1.0)
Monocytes Relative: 5 %
Neutro Abs: 5.7 10*3/uL (ref 1.7–7.7)
Neutrophils Relative %: 76 %
Platelets: 251 10*3/uL (ref 150–400)
RBC: 4.17 MIL/uL (ref 3.87–5.11)
RDW: 16.7 % — ABNORMAL HIGH (ref 11.5–15.5)
WBC: 7.7 10*3/uL (ref 4.0–10.5)
nRBC: 0 % (ref 0.0–0.2)

## 2019-08-02 LAB — D-DIMER, QUANTITATIVE: D-Dimer, Quant: 0.57 ug/mL-FEU — ABNORMAL HIGH (ref 0.00–0.50)

## 2019-08-02 LAB — URINALYSIS, ROUTINE W REFLEX MICROSCOPIC
Bilirubin Urine: NEGATIVE
Glucose, UA: NEGATIVE mg/dL
Ketones, ur: >80 mg/dL — AB
Leukocytes,Ua: NEGATIVE
Nitrite: NEGATIVE
Protein, ur: NEGATIVE mg/dL
Specific Gravity, Urine: >1.03 — ABNORMAL HIGH (ref 1.005–1.030)
pH: 6 (ref 5.0–8.0)

## 2019-08-02 LAB — TROPONIN I (HIGH SENSITIVITY)
Troponin I (High Sensitivity): 2 ng/L (ref <18)
Troponin I (High Sensitivity): 2 ng/L (ref <18)

## 2019-08-02 LAB — BASIC METABOLIC PANEL
Anion gap: 19 — ABNORMAL HIGH (ref 5–15)
BUN: 11 mg/dL (ref 6–20)
CO2: 17 mmol/L — ABNORMAL LOW (ref 22–32)
Calcium: 8.4 mg/dL — ABNORMAL LOW (ref 8.9–10.3)
Chloride: 94 mmol/L — ABNORMAL LOW (ref 98–111)
Creatinine, Ser: 0.77 mg/dL (ref 0.44–1.00)
GFR calc Af Amer: >60 mL/min (ref >60)
GFR calc non Af Amer: >60 mL/min (ref >60)
Glucose, Bld: 135 mg/dL — ABNORMAL HIGH (ref 70–99)
Potassium: 3.4 mmol/L — ABNORMAL LOW (ref 3.5–5.1)
Sodium: 130 mmol/L — ABNORMAL LOW (ref 135–145)

## 2019-08-02 LAB — TSH: TSH: 0.541 u[IU]/mL (ref 0.350–4.500)

## 2019-08-02 LAB — URINALYSIS, MICROSCOPIC (REFLEX)

## 2019-08-02 LAB — BRAIN NATRIURETIC PEPTIDE: B Natriuretic Peptide: 36.1 pg/mL (ref 0.0–100.0)

## 2019-08-02 LAB — PREGNANCY, URINE: Preg Test, Ur: NEGATIVE

## 2019-08-02 MED ORDER — SODIUM CHLORIDE 0.9 % IV BOLUS
1000.0000 mL | Freq: Once | INTRAVENOUS | Status: AC
  Administered 2019-08-02: 1000 mL via INTRAVENOUS

## 2019-08-02 MED ORDER — POTASSIUM CHLORIDE 10 MEQ/100ML IV SOLN: 10.0000 meq | Freq: Once | INTRAVENOUS | Status: DC

## 2019-08-02 MED ORDER — ALBUTEROL SULFATE HFA 108 (90 BASE) MCG/ACT IN AERS: 1.0000 | INHALATION_SPRAY | Freq: Four times a day (QID) | RESPIRATORY_TRACT | 0 refills | Status: DC | PRN

## 2019-08-02 MED ORDER — IOHEXOL 350 MG/ML SOLN
75.0000 mL | Freq: Once | INTRAVENOUS | Status: AC | PRN
  Administered 2019-08-02: 75 mL via INTRAVENOUS

## 2019-08-02 MED ORDER — POTASSIUM CHLORIDE CRYS ER 20 MEQ PO TBCR
40.0000 meq | EXTENDED_RELEASE_TABLET | Freq: Once | ORAL | Status: AC
  Administered 2019-08-02: 40 meq via ORAL
  Filled 2019-08-02: qty 2

## 2019-08-02 MED ORDER — ALBUTEROL SULFATE HFA 108 (90 BASE) MCG/ACT IN AERS
INHALATION_SPRAY | RESPIRATORY_TRACT | Status: AC
  Administered 2019-08-02: 4
  Filled 2019-08-02: qty 6.7

## 2019-08-02 MED ORDER — NITROFURANTOIN MONOHYD MACRO 100 MG PO CAPS: 100.0000 mg | ORAL_CAPSULE | Freq: Two times a day (BID) | ORAL | 0 refills | Status: DC

## 2019-08-02 MED ORDER — ONDANSETRON HCL 4 MG/2ML IJ SOLN
4.0000 mg | Freq: Once | INTRAMUSCULAR | Status: AC
  Administered 2019-08-02: 4 mg via INTRAVENOUS
  Filled 2019-08-02: qty 2

## 2019-08-02 MED ORDER — PREDNISONE 20 MG PO TABS: 40.0000 mg | ORAL_TABLET | Freq: Every day | ORAL | 0 refills | Status: AC

## 2019-08-02 NOTE — Discharge Instructions (Signed)
Take the medications as prescribed. Follow up with Cardiology. Return for new or worsening symptoms.

## 2019-08-02 NOTE — ED Provider Notes (Signed)
[facility] EMERGENCY DEPARTMENT Provider Note   CSN: [id number] Arrival date & time: 08/02/19  1022   History   Chief Complaint Chief Complaint  Patient presents with   Shortness of Breath   Dysuria   HPI Victoria Travis is a 53 y.o. female with possible history significant for Hashimoto's thyroiditis, alcohol dependence, occasional EtOH now with concerns for shortness of breath.  Patient states she has been short of breath over the last 4 days.  States she was diagnosed with bronchitis last April and this feels similar.  Feels short of breath with exertion and with laying down flat.  States she has felt a burning sensation across her chest however denies any overt chest pain.  Not exertional.  Patient states it has been difficult to take a deep breath.  Also admits to burning with urination.  She denies fever, chills, neck pain, neck stiffness, dizziness, lightheadedness, hemoptysis, abdominal pain, diarrhea, weakness.  Patient states she has had some fatigue as well as persistent nausea.  Patient states she is also had a decreased appetite over the last week and has had very little to eat secondary to her decreased appetite.  She denies any prior history of PE, DVT or MI.  She denies any lateral leg swelling, recent travel, immobilization, malignancy, exogenous estrogen therapy.  She denies any prior history of lung disease, tobacco use, cardiac pathology.  Denies additional aggravating or alleviating factors.  History obtained from patient and past medical records.  No interpreter is used.     HPI  Past Medical History:  Diagnosis Date   Alcohol dependence (HCC)    Depression    IBS (irritable bowel syndrome)    Thyroid disease     Patient Active Problem List   Diagnosis Date Noted   Alcohol dependence (HCC) 03/07/2018   MDD (major depressive disorder), severe (HCC) 03/06/2018   Post-surgical hypothyroidism 06/11/2012   Cellulitis of foot 06/11/2012     History reviewed. No pertinent surgical history.   OB History   No obstetric history on file.     Home Medications    Prior to Admission medications   Medication Sig Start Date End Date Taking? Authorizing Provider  albuterol (VENTOLIN HFA) 108 (90 Base) MCG/ACT inhaler Inhale 1-2 puffs into the lungs every 6 (six) hours as needed for wheezing or shortness of breath. 08/02/19   Sheridan Gettel A, PA-C  cyclobenzaprine (FLEXERIL) 10 MG tablet Take 1 tablet (10 mg total) by mouth 3 (three) times daily as needed for muscle spasms. 05/16/19   Myles LippsSantiago, Irma M, MD  levothyroxine (SYNTHROID) 100 MCG tablet TAKE 1 TABLET BY MOUTH ONCE DAILY BEFORE BREAKFAST FOR  HYPOTHYROIDISM 07/08/19   Janeece AgeeMorrow, Richard, NP  meloxicam (MOBIC) 15 MG tablet Take 1 tablet (15 mg total) by mouth daily. 05/16/19   Myles LippsSantiago, Irma M, MD  nitrofurantoin, macrocrystal-monohydrate, (MACROBID) 100 MG capsule Take 1 capsule (100 mg total) by mouth 2 (two) times daily. 08/02/19   Marquest Gunkel A, PA-C  predniSONE (DELTASONE) 20 MG tablet Take 2 tablets (40 mg total) by mouth daily for 5 days. 08/02/19 08/07/19  Crisanto Nied A, PA-C    Family History History reviewed. No pertinent family history.  Social History Social History   Tobacco Use   Smoking status: Former Smoker   Smokeless tobacco: Never Used  Substance Use Topics   Alcohol use: Yes    Alcohol/week: 14.0 standard drinks    Types: 14 Shots of liquor per week    Comment: 1/2  gallon vodka every 9 days - drinks daily - 4 large drinks   Drug use: No   Allergies   Patient has no known allergies.  Review of Systems Review of Systems  Constitutional: Positive for appetite change and fatigue. Negative for activity change, chills and diaphoresis.  HENT: Negative.   Respiratory: Positive for shortness of breath. Negative for apnea, cough, choking, chest tightness, wheezing and stridor.   Cardiovascular: Negative.   Gastrointestinal: Positive for  nausea. Negative for abdominal pain, anal bleeding, blood in stool, constipation, diarrhea, rectal pain and vomiting.  Genitourinary: Negative.   Musculoskeletal: Negative.   Skin: Negative.   Neurological: Negative.   All other systems reviewed and are negative.  Physical Exam Updated Vital Signs BP 137/84 (BP Location: Right Arm)    Pulse 99    Temp 98.5 F (36.9 C) (Oral)    Resp 18    Ht 5\' 6"  (1.676 m)    Wt 65.8 kg    LMP 05/13/2019    SpO2 100%    BMI 23.40 kg/m   Physical Exam Vitals signs and nursing note reviewed.  Constitutional:      General: She is not in acute distress.    Appearance: She is not ill-appearing, toxic-appearing or diaphoretic.  HENT:     Head: Normocephalic and atraumatic.     Jaw: There is normal jaw occlusion.  Neck:     Musculoskeletal: Full passive range of motion without pain, normal range of motion and neck supple.     Vascular: No carotid bruit or JVD.     Trachea: Trachea and phonation normal.     Comments: No JVD Cardiovascular:     Rate and Rhythm: Tachycardia present.     Pulses: Normal pulses.          Radial pulses are 2+ on the right side and 2+ on the left side.       Posterior tibial pulses are 2+ on the right side and 2+ on the left side.     Heart sounds: Normal heart sounds.     Comments: HR 104 in room Pulmonary:     Effort: Pulmonary effort is normal.     Breath sounds: Normal breath sounds and air entry.     Comments: Speaks in full sentences without difficulty.  Lungs clear to auscultation. Abdominal:     Palpations: Abdomen is soft.     Tenderness: There is no abdominal tenderness.  Musculoskeletal:     Comments: Moves all 4 extremities slight difficulty.  Calves, soft, nontender without edema, erythema or warmth bilaterally.  Skin:    Comments: Brisk capillary refill.  Neurological:     Mental Status: She is alert.    ED Treatments / Results  Labs (all labs ordered are listed, but only abnormal results are  displayed) Labs Reviewed  URINALYSIS, ROUTINE W REFLEX MICROSCOPIC - Abnormal; Notable for the following components:      Result Value   APPearance HAZY (*)    Specific Gravity, Urine >1.030 (*)    Hgb urine dipstick TRACE (*)    Ketones, ur >80 (*)    All other components within normal limits  URINALYSIS, MICROSCOPIC (REFLEX) - Abnormal; Notable for the following components:   Bacteria, UA MANY (*)    All other components within normal limits  CBC WITH DIFFERENTIAL/PLATELET - Abnormal; Notable for the following components:   RDW 16.7 (*)    All other components within normal limits  BASIC METABOLIC PANEL - Abnormal; Notable  for the following components:   Sodium 130 (*)    Potassium 3.4 (*)    Chloride 94 (*)    CO2 17 (*)    Glucose, Bld 135 (*)    Calcium 8.4 (*)    Anion gap 19 (*)    All other components within normal limits  D-DIMER, QUANTITATIVE (NOT AT [facility]) - Abnormal; Notable for the following components:   D-Dimer, Quant 0.57 (*)    All other components within normal limits  BRAIN NATRIURETIC PEPTIDE  PREGNANCY, URINE  TSH  TROPONIN I (HIGH SENSITIVITY)  TROPONIN I (HIGH SENSITIVITY)    EKG EKG Interpretation  Date/Time:  Saturday August 02 2019 11:33:14 EDT Ventricular Rate:  96 PR Interval:    QRS Duration: 58 QT Interval:  459 QTC Calculation: 581 R Axis:   57 Text Interpretation:  Sinus rhythm Nonspecific T abnormalities, anterior leads Prolonged QT interval prolonged QT and diffuse T wave flattening compared to previous; new T wave inversions in V2-V3 Confirmed by Frederick Peers [phone]) on 08/02/2019 11:43:27 AM   Radiology Dg Chest 2 View  Result Date: 08/02/2019 CLINICAL DATA:  Shortness of breath EXAM: CHEST - 2 VIEW COMPARISON:  January 29, 2019 FINDINGS: No edema or consolidation. Heart size and pulmonary vascularity are normal. No adenopathy. No pneumothorax. No bone lesions. IMPRESSION: No edema or consolidation. Electronically Signed   By:  Bretta Bang III M.D.   On: 08/02/2019 12:43   Ct Angio Chest Pe W/cm &/or Wo Cm  Result Date: 08/02/2019 CLINICAL DATA:  Shortness of breath, ongoing for 5 days.  No cough. EXAM: CT ANGIOGRAPHY CHEST WITH CONTRAST TECHNIQUE: Multidetector CT imaging of the chest was performed using the standard protocol during bolus administration of intravenous contrast. Multiplanar CT image reconstructions and MIPs were obtained to evaluate the vascular anatomy. CONTRAST:  75mL OMNIPAQUE IOHEXOL 350 MG/ML SOLN COMPARISON:  None. FINDINGS: Cardiovascular: Satisfactory opacification of the pulmonary arteries to the segmental level. No evidence of pulmonary embolism. Normal heart size. No pericardial effusion. Thoracic aorta is normal in caliber. Mediastinum/Nodes: No enlarged mediastinal, hilar, or axillary lymph nodes. Thyroid gland, trachea, and esophagus demonstrate no significant findings. Lungs/Pleura: No focal consolidation. 3 mm right upper lobe subpleural pulmonary nodule and a second 3 mm right upper lobe subpleural pulmonary nodule. No pleural effusion or pneumothorax. Upper Abdomen: Diffuse low attenuation of the liver as can be seen with hepatic steatosis. Small hiatal hernia. Musculoskeletal: No acute osseous abnormality. No aggressive osseous lesion. Review of the MIP images confirms the above findings. IMPRESSION: 1. No evidence of pulmonary embolus. 2. No acute cardiopulmonary disease. 3. Hepatic steatosis. 4. There are two subpleural 3 mm right upper lobe pulmonary nodules No follow-up needed if patient is low-risk (and has no known or suspected primary neoplasm). Non-contrast chest CT can be considered in 12 months if patient is high-risk. This recommendation follows the consensus statement: Guidelines for Management of Incidental Pulmonary Nodules Detected on CT Images: From the Fleischner Society 2017; Radiology 2017; 284:228-243. Electronically Signed   By: Elige Ko   On: 08/02/2019 13:37     Procedures Procedures (including critical care time)  Medications Ordered in ED Medications  potassium chloride SA (KLOR-CON) CR tablet 40 mEq (has no administration in time range)  albuterol (VENTOLIN HFA) 108 (90 Base) MCG/ACT inhaler (4 puffs  Given 08/02/19 1041)  ondansetron (ZOFRAN) injection 4 mg (4 mg Intravenous Given 08/02/19 1158)  sodium chloride 0.9 % bolus 1,000 mL (0 mLs Intravenous Stopped 08/02/19 1439)  iohexol (OMNIPAQUE) 350 MG/ML injection 75 mL (75 mLs Intravenous Contrast Given 08/02/19 1314)   Initial Impression / Assessment and Plan / ED Course  I have reviewed the triage vital signs and the nursing notes.  Pertinent labs & imaging results that were available during my care of the patient were reviewed by me and considered in my medical decision making (see chart for details).  53 year old female appears otherwise well presents for evaluation of shortness of breath.  She is afebrile, nonseptic, non-ill-appearing.  She is tachycardic in the room to low 100s.  States similar symptoms with last episode of bronchitis however denies any cough.  Has had persistent nausea without emesis.  Abdomen soft, nontender without rebound or guarding.  Heart and lungs clear.  Patient does not appear fluid overloaded.  No evidence of DVT on exam however she is tachycardic and cannot PERC.  Will obtain D-dimer.  She admits to some burning in her chest however no overt chest pain.  Will get chest pain labs, provide Zofran for nausea.  She was given albuterol by respiratory therapist prior to my evaluation.  Also with dysuria.  Urine with many bacteria however negative leuks and nitrite.  Does seem anxious and has been intermittently hyperventilating.  Also does appear mildly dry. She does not want anything for anxiety at this time.  CBC without leukocytosis Metabolic panel with mild hyponatremia, potassium 3.4 will treat given prolonged QT on EKG, glucose 135, anion gap 19.  Consistent  with dehydration and hyperventilation given CO2 at 17.  Urinalysis with many bacteria.  Culture and DC home with antibiotics.  Pregnancy test negative.  BNP 36.  Dimer elevated CTA negative for PE.  She does have 2 pulmonary nodules.  Recommend follow-up in 1 year. EKG with some T wave inversions in V2-V3.  Prolonged QT interval. No STEMI  Repeat EKG without acute changes.  Suspect more alcohol than patient specifies in room.  Her CIWA score is 0. No evidence of WD her in the ED. Will treat as bronchitis. Will also dc home with ABX for UTI given symptomatic with large bacteria. Discussed F/U with Cards due to prolonged QT interval. Does not want COVID testing at this time.  Patient is to be discharged with recommendation to follow up with PCP in regards to today's hospital visit. Chest pain is not likely of cardiac or pulmonary etiology d/t presentation, PERC negative, VSS, no tracheal deviation, no JVD or new murmur, RRR, breath sounds equal bilaterally, EKG without acute abnormalities, negative troponin, and negative CXR. Pt has been advised to return to the ED if CP becomes exertional, associated with diaphoresis or nausea, radiates to left jaw/arm, worsens or becomes concerning in any way. Pt appears reliable for follow up and is agreeable to discharge. Low suspicion for ACS, PE, dissection, myocarditis, pericarditis, bacterial infectious process.  Case has been discussed with Dr. Clarene Duke who agrees with the above treatment, plan and discharge.         Victoria Travis was evaluated in Emergency Department on 08/02/2019 for the symptoms described in the history of present illness. She was evaluated in the context of the global COVID-19 pandemic, which necessitated consideration that the patient might be at risk for infection with the SARS-CoV-2 virus that causes COVID-19. Institutional protocols and algorithms that pertain to the evaluation of patients at risk for COVID-19 are in a state of rapid  change based on information released by regulatory bodies including the CDC and federal and state organizations. These policies  and algorithms were followed during the patient's care in the ED. Final Clinical Impressions(s) / ED Diagnoses   Final diagnoses:  SOB (shortness of breath)  Acute cystitis without hematuria    ED Discharge Orders         Ordered    predniSONE (DELTASONE) 20 MG tablet  Daily     08/02/19 1500    albuterol (VENTOLIN HFA) 108 (90 Base) MCG/ACT inhaler  Every 6 hours PRN     08/02/19 1500    nitrofurantoin, macrocrystal-monohydrate, (MACROBID) 100 MG capsule  2 times daily     08/02/19 1500           Lilyona Richner A, PA-C 08/02/19 1512    Little, Wenda Overland, MD 08/03/19 [phone]

## 2019-08-02 NOTE — ED Triage Notes (Addendum)
SOB for 5 days, denies cough. Also c/o dysuria and frequency since yesterday.

## 2019-08-26 ENCOUNTER — Encounter (HOSPITAL_BASED_OUTPATIENT_CLINIC_OR_DEPARTMENT_OTHER): Payer: Self-pay | Admitting: Emergency Medicine

## 2019-08-26 ENCOUNTER — Emergency Department (HOSPITAL_BASED_OUTPATIENT_CLINIC_OR_DEPARTMENT_OTHER)
Admission: EM | Admit: 2019-08-26 | Discharge: 2019-08-26 | Disposition: A | Discharge status: Home / Self Care | Payer: Self-pay | Attending: Emergency Medicine | Admitting: Emergency Medicine

## 2019-08-26 ENCOUNTER — Emergency Department (HOSPITAL_BASED_OUTPATIENT_CLINIC_OR_DEPARTMENT_OTHER): Payer: Self-pay

## 2019-08-26 ENCOUNTER — Other Ambulatory Visit: Payer: Self-pay

## 2019-08-26 DIAGNOSIS — Z20828 Contact with and (suspected) exposure to other viral communicable diseases: Secondary | ICD-10-CM | POA: Insufficient documentation

## 2019-08-26 DIAGNOSIS — E860 Dehydration: Secondary | ICD-10-CM | POA: Insufficient documentation

## 2019-08-26 DIAGNOSIS — Z79899 Other long term (current) drug therapy: Secondary | ICD-10-CM | POA: Insufficient documentation

## 2019-08-26 DIAGNOSIS — F1020 Alcohol dependence, uncomplicated: Secondary | ICD-10-CM | POA: Insufficient documentation

## 2019-08-26 DIAGNOSIS — R300 Dysuria: Secondary | ICD-10-CM | POA: Insufficient documentation

## 2019-08-26 DIAGNOSIS — E079 Disorder of thyroid, unspecified: Secondary | ICD-10-CM | POA: Insufficient documentation

## 2019-08-26 DIAGNOSIS — R0602 Shortness of breath: Secondary | ICD-10-CM | POA: Insufficient documentation

## 2019-08-26 DIAGNOSIS — Z87891 Personal history of nicotine dependence: Secondary | ICD-10-CM | POA: Insufficient documentation

## 2019-08-26 DIAGNOSIS — E876 Hypokalemia: Secondary | ICD-10-CM | POA: Insufficient documentation

## 2019-08-26 DIAGNOSIS — R112 Nausea with vomiting, unspecified: Secondary | ICD-10-CM | POA: Insufficient documentation

## 2019-08-26 LAB — CBC WITH DIFFERENTIAL/PLATELET
Abs Immature Granulocytes: 0.01 10*3/uL (ref 0.00–0.07)
Basophils Absolute: 0 10*3/uL (ref 0.0–0.1)
Basophils Relative: 0 %
Eosinophils Absolute: 0 10*3/uL (ref 0.0–0.5)
Eosinophils Relative: 0 %
HCT: 38.7 % (ref 36.0–46.0)
Hemoglobin: 12.3 g/dL (ref 12.0–15.0)
Immature Granulocytes: 0 %
Lymphocytes Relative: 31 %
Lymphs Abs: 1.5 10*3/uL (ref 0.7–4.0)
MCH: 29.4 pg (ref 26.0–34.0)
MCHC: 31.8 g/dL (ref 30.0–36.0)
MCV: 92.6 fL (ref 80.0–100.0)
Monocytes Absolute: 0.2 10*3/uL (ref 0.1–1.0)
Monocytes Relative: 5 %
Neutro Abs: 3 10*3/uL (ref 1.7–7.7)
Neutrophils Relative %: 64 %
Platelets: 250 10*3/uL (ref 150–400)
RBC: 4.18 MIL/uL (ref 3.87–5.11)
RDW: 18.6 % — ABNORMAL HIGH (ref 11.5–15.5)
WBC: 4.8 10*3/uL (ref 4.0–10.5)
nRBC: 0 % (ref 0.0–0.2)

## 2019-08-26 LAB — URINALYSIS, MICROSCOPIC (REFLEX)

## 2019-08-26 LAB — BASIC METABOLIC PANEL
Anion gap: 20 — ABNORMAL HIGH (ref 5–15)
BUN: 12 mg/dL (ref 6–20)
CO2: 19 mmol/L — ABNORMAL LOW (ref 22–32)
Calcium: 8.6 mg/dL — ABNORMAL LOW (ref 8.9–10.3)
Chloride: 96 mmol/L — ABNORMAL LOW (ref 98–111)
Creatinine, Ser: 0.67 mg/dL (ref 0.44–1.00)
GFR calc Af Amer: >60 mL/min (ref >60)
GFR calc non Af Amer: >60 mL/min (ref >60)
Glucose, Bld: 89 mg/dL (ref 70–99)
Potassium: 3.2 mmol/L — ABNORMAL LOW (ref 3.5–5.1)
Sodium: 135 mmol/L (ref 135–145)

## 2019-08-26 LAB — URINALYSIS, ROUTINE W REFLEX MICROSCOPIC
Bilirubin Urine: NEGATIVE
Glucose, UA: NEGATIVE mg/dL
Ketones, ur: >80 mg/dL — AB
Leukocytes,Ua: NEGATIVE
Nitrite: NEGATIVE
Protein, ur: 30 mg/dL — AB
Specific Gravity, Urine: >1.03 — ABNORMAL HIGH (ref 1.005–1.030)
pH: 6 (ref 5.0–8.0)

## 2019-08-26 LAB — TROPONIN I (HIGH SENSITIVITY): Troponin I (High Sensitivity): <2 ng/L (ref <18)

## 2019-08-26 LAB — BRAIN NATRIURETIC PEPTIDE: B Natriuretic Peptide: 32.5 pg/mL (ref 0.0–100.0)

## 2019-08-26 MED ORDER — POTASSIUM CHLORIDE CRYS ER 20 MEQ PO TBCR
20.0000 meq | EXTENDED_RELEASE_TABLET | Freq: Once | ORAL | Status: AC
  Administered 2019-08-26: 20 meq via ORAL
  Filled 2019-08-26: qty 1

## 2019-08-26 MED ORDER — SODIUM CHLORIDE 0.9 % IV BOLUS
1000.0000 mL | Freq: Once | INTRAVENOUS | Status: AC
  Administered 2019-08-26: 1000 mL via INTRAVENOUS

## 2019-08-26 MED ORDER — PREDNISONE 20 MG PO TABS: 60.0000 mg | ORAL_TABLET | Freq: Every day | ORAL | 0 refills | Status: DC

## 2019-08-26 MED ORDER — ALBUTEROL SULFATE HFA 108 (90 BASE) MCG/ACT IN AERS
4.0000 | INHALATION_SPRAY | Freq: Once | RESPIRATORY_TRACT | Status: AC
  Administered 2019-08-26: 4 via RESPIRATORY_TRACT
  Filled 2019-08-26: qty 6.7

## 2019-08-26 MED ORDER — AZITHROMYCIN 250 MG PO TABS: 250.0000 mg | ORAL_TABLET | Freq: Every day | ORAL | 0 refills | Status: DC

## 2019-08-26 NOTE — ED Triage Notes (Signed)
SOB x 4 days. Denies cough. tx for bronchitis at the end of Oct

## 2019-08-26 NOTE — Discharge Instructions (Signed)
You were seen in the emergency department for evaluation of shortness of breath.  You had blood work EKG and a chest x-ray that did not show any serious findings.  We are treating you with antibiotics and prednisone for possible bronchitis.  You also were dehydrated and had a low potassium.  You have been having recurrent bouts of shortness of breath and so we put in a referral for you to see pulmonology.  Please follow-up with your regular doctor also.  Return to the emergency department if any worsening symptoms.

## 2019-08-26 NOTE — ED Provider Notes (Signed)
[facility] EMERGENCY DEPARTMENT Provider Note   CSN: [id number] Arrival date & time: 08/26/19  1507     History   Chief Complaint Chief Complaint  Patient presents with  . Shortness of Breath    HPI Victoria Travis is a 53 y.o. female.  She is here with complaint of shortness of breath for the last 4 to 5 days.  She said she was here at the end of last month and evaluated for shortness of breath and treated with prednisone albuterol and also covered with Macrobid for UTI.  She said she improved for a little while but then had a recurrence of all of her symptoms.  She is complaining of shortness of breath and difficulty with sleeping and difficulty at work.  Its not associated with a cough or a fever.  She is also had nausea vomiting and diarrhea.  Also endorses some urinary discomfort.    The history is provided by the patient.  Shortness of Breath Severity:  Moderate Onset quality:  Gradual Timing:  Constant Progression:  Worsening Chronicity:  Recurrent Context: activity   Relieved by:  Nothing Worsened by:  Activity Ineffective treatments:  Inhaler Associated symptoms: vomiting   Associated symptoms: no abdominal pain, no chest pain, no cough, no fever, no headaches, no rash, no sore throat, no sputum production and no wheezing   Risk factors: alcohol use and tobacco use     Past Medical History:  Diagnosis Date  . Alcohol dependence (HCC)   . Depression   . IBS (irritable bowel syndrome)   . Thyroid disease     Patient Active Problem List   Diagnosis Date Noted  . Alcohol dependence (HCC) 03/07/2018  . MDD (major depressive disorder), severe (HCC) 03/06/2018  . 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 Cellulitis of foot 06/11/2012    History reviewed. No pertinent surgical history.   OB History   No obstetric history on file.      Home Medications    Prior to Admission medications   Medication Sig Start Date End Date Taking? Authorizing  Provider  albuterol (VENTOLIN HFA) 108 (90 Base) MCG/ACT inhaler Inhale 1-2 puffs into the lungs every 6 (six) hours as needed for wheezing or shortness of breath. 08/02/19   Henderly, Britni A, PA-C  cyclobenzaprine (FLEXERIL) 10 MG tablet Take 1 tablet (10 mg total) by mouth 3 (three) times daily as needed for muscle spasms. 05/16/19   Myles Lipps, MD  levothyroxine (SYNTHROID) 100 MCG tablet TAKE 1 TABLET BY MOUTH ONCE DAILY BEFORE BREAKFAST FOR  HYPOTHYROIDISM 07/08/19   Janeece Agee, NP  meloxicam (MOBIC) 15 MG tablet Take 1 tablet (15 mg total) by mouth daily. 05/16/19   Myles Lipps, MD  nitrofurantoin, macrocrystal-monohydrate, (MACROBID) 100 MG capsule Take 1 capsule (100 mg total) by mouth 2 (two) times daily. 08/02/19   Henderly, Britni A, PA-C    Family History No family history on file.  Social History Social History   Tobacco Use  . Smoking status: Former Games developer  . Smokeless tobacco: Never Used  Substance Use Topics  . Alcohol use: Yes    Alcohol/week: 14.0 standard drinks    Types: 14 Shots of liquor per week    Comment: 1/2 gallon vodka every 9 days - drinks daily - 4 large drinks  . Drug use: No     Allergies   Patient has no known allergies.   Review of Systems Review of Systems  Constitutional: Negative for fever.  HENT: Negative  for sore throat.   Eyes: Negative for visual disturbance.  Respiratory: Positive for shortness of breath. Negative for cough, sputum production and wheezing.   Cardiovascular: Negative for chest pain.  Gastrointestinal: Positive for diarrhea, nausea and vomiting. Negative for abdominal pain.  Genitourinary: Positive for dysuria.  Musculoskeletal: Negative for back pain.  Skin: Negative for rash.  Neurological: Negative for headaches.     Physical Exam Updated Vital Signs BP (!) 160/104 (BP Location: Right Arm)   Pulse 93   Temp 98.1 F (36.7 C) (Oral)   Resp (!) 24   Ht  (1.676 m)   Wt 66.7 kg   LMP  05/13/2019   SpO2 100%   BMI 23.73 kg/m   Physical Exam Vitals signs and nursing note reviewed.  Constitutional:      General: She is not in acute distress.    Appearance: She is well-developed.  HENT:     Head: Normocephalic and atraumatic.  Eyes:     Conjunctiva/sclera: Conjunctivae normal.  Neck:     Musculoskeletal: Neck supple.  Cardiovascular:     Rate and Rhythm: Normal rate and regular rhythm.     Heart sounds: No murmur.  Pulmonary:     Effort: Pulmonary effort is normal. Tachypnea present. No respiratory distress.     Breath sounds: Normal breath sounds.  Abdominal:     Palpations: Abdomen is soft.     Tenderness: There is no abdominal tenderness.  Musculoskeletal: Normal range of motion.     Right lower leg: She exhibits no tenderness. No edema.     Left lower leg: She exhibits no tenderness. No edema.  Skin:    General: Skin is warm and dry.     Capillary Refill: Capillary refill takes less than 2 seconds.  Neurological:     General: No focal deficit present.     Mental Status: She is alert.     Gait: Gait normal.      ED Treatments / Results  Labs (all labs ordered are listed, but only abnormal results are displayed) Labs Reviewed  BASIC METABOLIC PANEL - Abnormal; Notable for the following components:      Result Value   Potassium 3.2 (*)    Chloride 96 (*)    CO2 19 (*)    Calcium 8.6 (*)    Anion gap 20 (*)    All other components within normal limits  CBC WITH DIFFERENTIAL/PLATELET - Abnormal; Notable for the following components:   RDW 18.6 (*)    All other components within normal limits  URINALYSIS, ROUTINE W REFLEX MICROSCOPIC - Abnormal; Notable for the following components:   APPearance CLOUDY (*)    Specific Gravity, Urine >1.030 (*)    Hgb urine dipstick TRACE (*)    Ketones, ur >80 (*)    Protein, ur 30 (*)    All other components within normal limits  URINALYSIS, MICROSCOPIC (REFLEX) - Abnormal; Notable for the following  components:   Bacteria, UA FEW (*)    All other components within normal limits  NOVEL CORONAVIRUS, NAA (HOSP ORDER, SEND-OUT TO REF LAB; TAT 18-24 HRS)  BRAIN NATRIURETIC PEPTIDE  TROPONIN I (HIGH SENSITIVITY)    EKG EKG Interpretation  Date/Time:  Tuesday August 26 2019 15:54:19 EST Ventricular Rate:  80 PR Interval:    QRS Duration: 90 QT Interval:  423 QTC Calculation: 488 R Axis:   59 Text Interpretation: sinus rhythm Borderline T abnormalities, anterior leads Borderline prolonged QT interval similar to prior 10/20  Confirmed by Aletta Edouard [phone]) on 08/26/2019 3:58:09 PM   Radiology Dg Chest Port 1 View  Result Date: 08/26/2019 CLINICAL DATA:  Shortness of breath EXAM: PORTABLE CHEST 1 VIEW COMPARISON:  08/02/2019 FINDINGS: The heart size and mediastinal contours are within normal limits. Both lungs are clear. The visualized skeletal structures are unremarkable. IMPRESSION: No acute abnormality of the lungs in AP portable projection. Electronically Signed   By: Eddie Candle M.D.   On: 08/26/2019 15:44    Procedures Procedures (including critical care time)  Medications Ordered in ED Medications  albuterol (VENTOLIN HFA) 108 (90 Base) MCG/ACT inhaler 4 puff (4 puffs Inhalation Given 08/26/19 1541)  sodium chloride 0.9 % bolus 1,000 mL (0 mLs Intravenous Stopped 08/26/19 1741)  potassium chloride SA (KLOR-CON) CR tablet 20 mEq (20 mEq Oral Given 08/26/19 1635)     Initial Impression / Assessment and Plan / ED Course  I have reviewed the triage vital signs and the nursing notes.  Pertinent labs & imaging results that were available during my care of the patient were reviewed by me and considered in my medical decision making (see chart for details).  Clinical Course as of Aug 26 1935  Tue Nov 17, 29103  4712 52 year old sometimes smoker here with increased shortness of breath.  On review of her prior visits she has had an extensive work-up for this in the past  most recently about 3 weeks ago.  It looks like she always has a slightly positive D-dimers and has had 2 chest CTs this year.  Sats are 100%.  She adamantly denies that this is anxiety related.  She is tachypneic and seemingly wheezing although it sounds more upper airway than lower.  Differential includes bronchitis, pneumonia, Covid, anxiety.  Will check some screening labs chest x-ray EKG.  She also was agreeable to Covid testing although she said she was tested maybe a month or 2 ago.   [MB]  4742 Chest x-ray interpreted by me as no gross infiltrates or pneumothorax.   [MB]  1626 Labs coming back showing a normal white count normal hemoglobin.  Her electrolytes are little off potassium of 3.2 and a bicarb of 19.  Urine also showing some red some whites and a lot of ketones.  Nitrite negative.  We will give her some IV fluids.  These labs are similar to ones that we had a few weeks ago.   [MB]  1645 Troponin and BMP unremarkable.  Reviewed all this with the patient.  She is getting some IV fluids here and will cover her for bronchitis with some antibiotics and steroids.  Recommended to her that she consider follow-up with pulmonology as she seems to get these symptoms a lot.   [MB]    Clinical Course User Index [MB] Hayden Rasmussen, MD   Lindalou Soltis was evaluated in Emergency Department on 08/26/2019 for the symptoms described in the history of present illness. She was evaluated in the context of the global COVID-19 pandemic, which necessitated consideration that the patient might be at risk for infection with the SARS-CoV-2 virus that causes COVID-19. Institutional protocols and algorithms that pertain to the evaluation of patients at risk for COVID-19 are in a state of rapid change based on information released by regulatory bodies including the CDC and federal and state organizations. These policies and algorithms were followed during the patient's care in the ED.      Final Clinical  Impressions(s) / ED Diagnoses   Final diagnoses:  SOB (  shortness of breath)  Hypokalemia  Dehydration    ED Discharge Orders         Ordered    Ambulatory referral to Pulmonology     08/26/19 1711    azithromycin (ZITHROMAX) 250 MG tablet  Daily     08/26/19 1722    predniSONE (DELTASONE) 20 MG tablet  Daily     08/26/19 1722           Terrilee FilesButler,  C, MD 08/26/19 1936

## 2019-08-26 NOTE — ED Notes (Addendum)
ED Provider at bedside discussing test results and plan of care. 

## 2019-08-27 LAB — NOVEL CORONAVIRUS, NAA (HOSP ORDER, SEND-OUT TO REF LAB; TAT 18-24 HRS): SARS-CoV-2, NAA: NOT DETECTED

## 2019-10-30 ENCOUNTER — Other Ambulatory Visit: Payer: Self-pay | Admitting: Registered Nurse

## 2019-10-30 NOTE — Telephone Encounter (Signed)
Medication refill

## 2019-10-31 NOTE — Telephone Encounter (Signed)
Another one to Dr. Doristine Bosworth, NP

## 2019-12-29 ENCOUNTER — Emergency Department (HOSPITAL_BASED_OUTPATIENT_CLINIC_OR_DEPARTMENT_OTHER)
Admission: EM | Admit: 2019-12-29 | Discharge: 2019-12-29 | Disposition: A | Discharge status: Home / Self Care | Payer: Self-pay | Attending: Emergency Medicine | Admitting: Emergency Medicine

## 2019-12-29 ENCOUNTER — Other Ambulatory Visit: Payer: Self-pay

## 2019-12-29 ENCOUNTER — Emergency Department (HOSPITAL_BASED_OUTPATIENT_CLINIC_OR_DEPARTMENT_OTHER): Payer: Self-pay

## 2019-12-29 ENCOUNTER — Encounter (HOSPITAL_BASED_OUTPATIENT_CLINIC_OR_DEPARTMENT_OTHER): Payer: Self-pay

## 2019-12-29 DIAGNOSIS — F17210 Nicotine dependence, cigarettes, uncomplicated: Secondary | ICD-10-CM | POA: Insufficient documentation

## 2019-12-29 DIAGNOSIS — E890 Postprocedural hypothyroidism: Secondary | ICD-10-CM | POA: Insufficient documentation

## 2019-12-29 DIAGNOSIS — F10220 Alcohol dependence with intoxication, uncomplicated: Secondary | ICD-10-CM | POA: Insufficient documentation

## 2019-12-29 DIAGNOSIS — Z79899 Other long term (current) drug therapy: Secondary | ICD-10-CM | POA: Insufficient documentation

## 2019-12-29 DIAGNOSIS — R110 Nausea: Secondary | ICD-10-CM | POA: Insufficient documentation

## 2019-12-29 DIAGNOSIS — F10920 Alcohol use, unspecified with intoxication, uncomplicated: Secondary | ICD-10-CM

## 2019-12-29 DIAGNOSIS — Z20822 Contact with and (suspected) exposure to covid-19: Secondary | ICD-10-CM | POA: Insufficient documentation

## 2019-12-29 LAB — COMPREHENSIVE METABOLIC PANEL
ALT: 98 U/L — ABNORMAL HIGH (ref 0–44)
AST: 154 U/L — ABNORMAL HIGH (ref 15–41)
Albumin: 3.8 g/dL (ref 3.5–5.0)
Alkaline Phosphatase: 52 U/L (ref 38–126)
Anion gap: 21 — ABNORMAL HIGH (ref 5–15)
BUN: 14 mg/dL (ref 6–20)
CO2: 19 mmol/L — ABNORMAL LOW (ref 22–32)
Calcium: 8.4 mg/dL — ABNORMAL LOW (ref 8.9–10.3)
Chloride: 99 mmol/L (ref 98–111)
Creatinine, Ser: 0.61 mg/dL (ref 0.44–1.00)
GFR calc Af Amer: >60 mL/min (ref >60)
GFR calc non Af Amer: >60 mL/min (ref >60)
Glucose, Bld: 71 mg/dL (ref 70–99)
Potassium: 3.8 mmol/L (ref 3.5–5.1)
Sodium: 139 mmol/L (ref 135–145)
Total Bilirubin: 0.7 mg/dL (ref 0.3–1.2)
Total Protein: 6.7 g/dL (ref 6.5–8.1)

## 2019-12-29 LAB — URINALYSIS, ROUTINE W REFLEX MICROSCOPIC
Bilirubin Urine: NEGATIVE
Glucose, UA: NEGATIVE mg/dL
Ketones, ur: >80 mg/dL — AB
Leukocytes,Ua: NEGATIVE
Nitrite: NEGATIVE
Protein, ur: NEGATIVE mg/dL
Specific Gravity, Urine: >1.03 — ABNORMAL HIGH (ref 1.005–1.030)
pH: 6 (ref 5.0–8.0)

## 2019-12-29 LAB — CBC WITH DIFFERENTIAL/PLATELET
Abs Immature Granulocytes: 0.02 10*3/uL (ref 0.00–0.07)
Basophils Absolute: 0 10*3/uL (ref 0.0–0.1)
Basophils Relative: 1 %
Eosinophils Absolute: 0 10*3/uL (ref 0.0–0.5)
Eosinophils Relative: 0 %
HCT: 41.7 % (ref 36.0–46.0)
Hemoglobin: 13.5 g/dL (ref 12.0–15.0)
Immature Granulocytes: 0 %
Lymphocytes Relative: 22 %
Lymphs Abs: 1.1 10*3/uL (ref 0.7–4.0)
MCH: 30.8 pg (ref 26.0–34.0)
MCHC: 32.4 g/dL (ref 30.0–36.0)
MCV: 95.2 fL (ref 80.0–100.0)
Monocytes Absolute: 0.3 10*3/uL (ref 0.1–1.0)
Monocytes Relative: 6 %
Neutro Abs: 3.7 10*3/uL (ref 1.7–7.7)
Neutrophils Relative %: 71 %
Platelets: 281 10*3/uL (ref 150–400)
RBC: 4.38 MIL/uL (ref 3.87–5.11)
RDW: 17.3 % — ABNORMAL HIGH (ref 11.5–15.5)
WBC: 5.1 10*3/uL (ref 4.0–10.5)
nRBC: 0 % (ref 0.0–0.2)

## 2019-12-29 LAB — SARS CORONAVIRUS 2 (TAT 6-24 HRS): SARS Coronavirus 2: NEGATIVE

## 2019-12-29 LAB — URINALYSIS, MICROSCOPIC (REFLEX)

## 2019-12-29 LAB — RAPID URINE DRUG SCREEN, HOSP PERFORMED
Amphetamines: NOT DETECTED
Barbiturates: NOT DETECTED
Benzodiazepines: NOT DETECTED
Cocaine: NOT DETECTED
Opiates: NOT DETECTED
Tetrahydrocannabinol: NOT DETECTED

## 2019-12-29 LAB — TROPONIN I (HIGH SENSITIVITY): Troponin I (High Sensitivity): <2 ng/L (ref <18)

## 2019-12-29 LAB — ETHANOL: Alcohol, Ethyl (B): 273 mg/dL — ABNORMAL HIGH (ref <10)

## 2019-12-29 MED ORDER — SODIUM CHLORIDE 0.9 % IV BOLUS
1000.0000 mL | Freq: Once | INTRAVENOUS | Status: AC
  Administered 2019-12-29: 1000 mL via INTRAVENOUS

## 2019-12-29 MED ORDER — ONDANSETRON HCL 4 MG/2ML IJ SOLN
4.0000 mg | Freq: Once | INTRAMUSCULAR | Status: AC
  Administered 2019-12-29: 12:00:00 4 mg via INTRAVENOUS
  Filled 2019-12-29: qty 2

## 2019-12-29 NOTE — ED Notes (Signed)
Pt on monitor 

## 2019-12-29 NOTE — Discharge Instructions (Signed)
Please return to ER if you develop abdominal pain, chest pain, difficulty breathing or other new concerning symptom.  Recommend follow-up with primary doctor.  Recommend pursuing alcohol cessation.  Discussed this with your primary doctor.

## 2019-12-29 NOTE — ED Triage Notes (Signed)
Pt reports she has been having SOB and muscle spasms for a few does. Pt does rpeort that she has not been taking her hypothyroid medication as prescribed.

## 2019-12-29 NOTE — ED Notes (Signed)
EDP @ bedside 

## 2019-12-29 NOTE — ED Provider Notes (Signed)
[facility] EMERGENCY DEPARTMENT Provider Note   CSN: [id number] Arrival date & time: 12/29/19  1027     History Chief Complaint  Patient presents with  . Shortness of Breath    Victoria Travis is a 54 y.o. female.  Presents to ER with complaint of chest pain, shortness of breath, muscle spasms.  Symptoms have been ongoing intermittently for the past few days, feels like she is having muscle spasms in her bilateral upper extremities.  Has been drinking heavily over the last few days.  Reports that she has intermittent episodes of heavy drinking.  No associated abdominal pain.  Has had some associated nausea, no vomiting.  Currently not having chest pain or difficulty breathing flight having some nausea. HPI     Past Medical History:  Diagnosis Date  . Alcohol dependence (HCC)   . Depression   . IBS (irritable bowel syndrome)   . Thyroid disease     Patient Active Problem List   Diagnosis Date Noted  . Alcohol dependence (HCC) 03/07/2018  . MDD (major depressive disorder), severe (HCC) 03/06/2018  . 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 Cellulitis of foot 06/11/2012    History reviewed. No pertinent surgical history.   OB History   No obstetric history on file.     No family history on file.  Social History   Tobacco Use  . Smoking status: Current Every Day Smoker    Types: Cigarettes  . Smokeless tobacco: Never Used  . Tobacco comment: pt reports she just started   Substance Use Topics  . Alcohol use: Yes    Alcohol/week: 14.0 standard drinks    Types: 14 Shots of liquor per week    Comment: 1/2 gallon vodka every 9 days - drinks daily - 4 large drinks  . Drug use: No    Home Medications Prior to Admission medications   Medication Sig Start Date End Date Taking? Authorizing Provider  albuterol (VENTOLIN HFA) 108 (90 Base) MCG/ACT inhaler Inhale 1-2 puffs into the lungs every 6 (six) hours as needed for wheezing or shortness of breath.  08/02/19   Henderly, Britni A, PA-C  azithromycin (ZITHROMAX) 250 MG tablet Take 1 tablet (250 mg total) by mouth daily. Take first 2 tablets together, then 1 every day until finished. 08/26/19   Terrilee Files, MD  cyclobenzaprine (FLEXERIL) 10 MG tablet Take 1 tablet (10 mg total) by mouth 3 (three) times daily as needed for muscle spasms. 05/16/19   Myles Lipps, MD  EUTHYROX 100 MCG tablet TAKE 1 TABLET BY MOUTH ONCE DAILY BEFORE BREAKFAST FOR  HYPOTHYROIDISM 10/31/19   Janeece Agee, NP  meloxicam (MOBIC) 15 MG tablet Take 1 tablet (15 mg total) by mouth daily. 05/16/19   Myles Lipps, MD  nitrofurantoin, macrocrystal-monohydrate, (MACROBID) 100 MG capsule Take 1 capsule (100 mg total) by mouth 2 (two) times daily. 08/02/19   Henderly, Britni A, PA-C  predniSONE (DELTASONE) 20 MG tablet Take 3 tablets (60 mg total) by mouth daily. 08/26/19   Terrilee Files, MD    Allergies    Patient has no known allergies.  Review of Systems   Review of Systems  Constitutional: Negative for chills and fever.  HENT: Negative for ear pain and sore throat.   Eyes: Negative for pain and visual disturbance.  Respiratory: Positive for shortness of breath. Negative for cough.   Cardiovascular: Positive for chest pain. Negative for palpitations.  Gastrointestinal: Positive for nausea. Negative for abdominal pain and vomiting.  Genitourinary: Negative for dysuria and hematuria.  Musculoskeletal: Negative for arthralgias and back pain.  Skin: Negative for color change and rash.  Neurological: Negative for seizures and syncope.  All other systems reviewed and are negative.   Physical Exam Updated Vital Signs BP (!) 136/95   Pulse 72   Temp 98.1 F (36.7 C) (Oral)   Resp 17   Ht 5\' 5"  (1.651 m)   Wt 64.9 kg   LMP 05/13/2019   SpO2 97%   BMI 23.81 kg/m   Physical Exam Vitals and nursing note reviewed.  Constitutional:      General: She is not in acute distress.    Appearance: She is  well-developed.  HENT:     Head: Normocephalic and atraumatic.  Eyes:     Conjunctiva/sclera: Conjunctivae normal.  Cardiovascular:     Rate and Rhythm: Normal rate and regular rhythm.     Heart sounds: No murmur.  Pulmonary:     Effort: Pulmonary effort is normal. No respiratory distress.     Breath sounds: Normal breath sounds.  Abdominal:     Palpations: Abdomen is soft.     Tenderness: There is no abdominal tenderness.  Musculoskeletal:     Cervical back: Neck supple.     Right lower leg: No edema.     Left lower leg: No edema.  Skin:    General: Skin is warm and dry.     Capillary Refill: Capillary refill takes less than 2 seconds.  Neurological:     General: No focal deficit present.     Mental Status: She is alert and oriented to person, place, and time.     ED Results / Procedures / Treatments   Labs (all labs ordered are listed, but only abnormal results are displayed) Labs Reviewed  CBC WITH DIFFERENTIAL/PLATELET - Abnormal; Notable for the following components:      Result Value   RDW 17.3 (*)    All other components within normal limits  COMPREHENSIVE METABOLIC PANEL - Abnormal; Notable for the following components:   CO2 19 (*)    Calcium 8.4 (*)    AST 154 (*)    ALT 98 (*)    Anion gap 21 (*)    All other components within normal limits  ETHANOL - Abnormal; Notable for the following components:   Alcohol, Ethyl (B) 273 (*)    All other components within normal limits  URINALYSIS, ROUTINE W REFLEX MICROSCOPIC - Abnormal; Notable for the following components:   Specific Gravity, Urine >1.030 (*)    Hgb urine dipstick SMALL (*)    Ketones, ur >80 (*)    All other components within normal limits  URINALYSIS, MICROSCOPIC (REFLEX) - Abnormal; Notable for the following components:   Bacteria, UA FEW (*)    All other components within normal limits  SARS CORONAVIRUS 2 (TAT 6-24 HRS)  RAPID URINE DRUG SCREEN, HOSP PERFORMED  TROPONIN I (HIGH SENSITIVITY)      EKG EKG Interpretation  Date/Time:  Monday December 29 2019 10:52:27 EDT Ventricular Rate:  87 PR Interval:    QRS Duration: 73 QT Interval:  395 QTC Calculation: 476 R Axis:   50 Text Interpretation: Sinus rhythm Atrial premature complex Baseline wander in lead(s) V5 V6 Confirmed by Madalyn Rob [phone]) on 12/29/2019 11:28:03 AM   Radiology DG Chest Portable 1 View  Result Date: 12/29/2019 CLINICAL DATA:  Shortness of breath EXAM: PORTABLE CHEST 1 VIEW COMPARISON:  08/26/2019 FINDINGS: The heart size and mediastinal contours  are within normal limits. Both lungs are clear. The visualized skeletal structures are unremarkable. IMPRESSION: No active disease. Electronically Signed   By: Alcide Clever M.D.   On: 12/29/2019 11:50    Procedures Procedures (including critical care time)  Medications Ordered in ED Medications  sodium chloride 0.9 % bolus 1,000 mL ( Intravenous Stopped 12/29/19 1311)  ondansetron (ZOFRAN) injection 4 mg (4 mg Intravenous Given 12/29/19 1157)    ED Course  I have reviewed the triage vital signs and the nursing notes.  Pertinent labs & imaging results that were available during my care of the patient were reviewed by me and considered in my medical decision making (see chart for details).  Clinical Course as of Dec 29 1535  Mon Dec 29, 2019  1335 Rechecked, symptoms resolved   [RD]    Clinical Course User Index [RD] Milagros Loll, MD   MDM Rules/Calculators/A&P                      54 year old lady presents to ER with multiple complaints including chest pain, shortness of breath, muscle cramps, nausea.  On exam she is noted to be well-appearing, seems to be mildly intoxicated.  On chart review noted history of alcohol abuse.  Vital signs stable, tolerating p.o. without difficulty.  Provided medication for nausea, IV fluids.  Symptoms completely resolved.  EKG without ischemic changes, troponin within normal limits, CXR negative.  Suspect  likely related to her alcohol intoxication.  Recommend consideration of alcohol cessation, follow-up with primary doctor.    After the discussed management above, the patient was determined to be safe for discharge.  The patient was in agreement with this plan and all questions regarding their care were answered.  ED return precautions were discussed and the patient will return to the ED with any significant worsening of condition.   Final Clinical Impression(s) / ED Diagnoses Final diagnoses:  Nausea  Alcoholic intoxication without complication [facility])    Rx / DC Orders ED Discharge Orders    None       Milagros Loll, MD 12/29/19 1537

## 2020-03-04 ENCOUNTER — Other Ambulatory Visit: Payer: Self-pay | Admitting: Registered Nurse

## 2020-03-04 NOTE — Telephone Encounter (Signed)
No further refills without office visit 

## 2020-04-06 ENCOUNTER — Other Ambulatory Visit: Payer: Self-pay

## 2020-04-06 ENCOUNTER — Emergency Department (HOSPITAL_BASED_OUTPATIENT_CLINIC_OR_DEPARTMENT_OTHER): Payer: Self-pay

## 2020-04-06 ENCOUNTER — Emergency Department (HOSPITAL_BASED_OUTPATIENT_CLINIC_OR_DEPARTMENT_OTHER)
Admission: EM | Admit: 2020-04-06 | Discharge: 2020-04-06 | Disposition: A | Discharge status: Home / Self Care | Payer: Self-pay | Attending: Emergency Medicine | Admitting: Emergency Medicine

## 2020-04-06 ENCOUNTER — Encounter (HOSPITAL_BASED_OUTPATIENT_CLINIC_OR_DEPARTMENT_OTHER): Payer: Self-pay | Admitting: Emergency Medicine

## 2020-04-06 DIAGNOSIS — R531 Weakness: Secondary | ICD-10-CM | POA: Insufficient documentation

## 2020-04-06 DIAGNOSIS — F17210 Nicotine dependence, cigarettes, uncomplicated: Secondary | ICD-10-CM | POA: Insufficient documentation

## 2020-04-06 DIAGNOSIS — R112 Nausea with vomiting, unspecified: Secondary | ICD-10-CM | POA: Insufficient documentation

## 2020-04-06 DIAGNOSIS — R300 Dysuria: Secondary | ICD-10-CM

## 2020-04-06 DIAGNOSIS — E890 Postprocedural hypothyroidism: Secondary | ICD-10-CM | POA: Insufficient documentation

## 2020-04-06 DIAGNOSIS — R10819 Abdominal tenderness, unspecified site: Secondary | ICD-10-CM | POA: Insufficient documentation

## 2020-04-06 DIAGNOSIS — R630 Anorexia: Secondary | ICD-10-CM | POA: Insufficient documentation

## 2020-04-06 DIAGNOSIS — E860 Dehydration: Secondary | ICD-10-CM | POA: Insufficient documentation

## 2020-04-06 LAB — LIPASE, BLOOD: Lipase: 26 U/L (ref 11–51)

## 2020-04-06 LAB — CBC WITH DIFFERENTIAL/PLATELET
Abs Immature Granulocytes: 0.04 10*3/uL (ref 0.00–0.07)
Basophils Absolute: 0 10*3/uL (ref 0.0–0.1)
Basophils Relative: 1 %
Eosinophils Absolute: 0 10*3/uL (ref 0.0–0.5)
Eosinophils Relative: 1 %
HCT: 43.9 % (ref 36.0–46.0)
Hemoglobin: 14.1 g/dL (ref 12.0–15.0)
Immature Granulocytes: 1 %
Lymphocytes Relative: 12 %
Lymphs Abs: 0.9 10*3/uL (ref 0.7–4.0)
MCH: 31.6 pg (ref 26.0–34.0)
MCHC: 32.1 g/dL (ref 30.0–36.0)
MCV: 98.4 fL (ref 80.0–100.0)
Monocytes Absolute: 0.4 10*3/uL (ref 0.1–1.0)
Monocytes Relative: 6 %
Neutro Abs: 6 10*3/uL (ref 1.7–7.7)
Neutrophils Relative %: 79 %
Platelets: 186 10*3/uL (ref 150–400)
RBC: 4.46 MIL/uL (ref 3.87–5.11)
RDW: 17.7 % — ABNORMAL HIGH (ref 11.5–15.5)
WBC: 7.4 10*3/uL (ref 4.0–10.5)
nRBC: 0 % (ref 0.0–0.2)

## 2020-04-06 LAB — COMPREHENSIVE METABOLIC PANEL
ALT: 115 U/L — ABNORMAL HIGH (ref 0–44)
AST: 142 U/L — ABNORMAL HIGH (ref 15–41)
Albumin: 4.7 g/dL (ref 3.5–5.0)
Alkaline Phosphatase: 61 U/L (ref 38–126)
Anion gap: 26 — ABNORMAL HIGH (ref 5–15)
BUN: 9 mg/dL (ref 6–20)
CO2: 16 mmol/L — ABNORMAL LOW (ref 22–32)
Calcium: 8.8 mg/dL — ABNORMAL LOW (ref 8.9–10.3)
Chloride: 93 mmol/L — ABNORMAL LOW (ref 98–111)
Creatinine, Ser: 0.73 mg/dL (ref 0.44–1.00)
GFR calc Af Amer: >60 mL/min (ref >60)
GFR calc non Af Amer: >60 mL/min (ref >60)
Glucose, Bld: 134 mg/dL — ABNORMAL HIGH (ref 70–99)
Potassium: 3.8 mmol/L (ref 3.5–5.1)
Sodium: 135 mmol/L (ref 135–145)
Total Bilirubin: 2.2 mg/dL — ABNORMAL HIGH (ref 0.3–1.2)
Total Protein: 8 g/dL (ref 6.5–8.1)

## 2020-04-06 LAB — URINALYSIS, ROUTINE W REFLEX MICROSCOPIC
Bilirubin Urine: NEGATIVE
Glucose, UA: NEGATIVE mg/dL
Ketones, ur: >80 mg/dL — AB
Leukocytes,Ua: NEGATIVE
Nitrite: NEGATIVE
Protein, ur: 30 mg/dL — AB
Specific Gravity, Urine: >1.03 — ABNORMAL HIGH (ref 1.005–1.030)
pH: 6 (ref 5.0–8.0)

## 2020-04-06 LAB — URINALYSIS, MICROSCOPIC (REFLEX)

## 2020-04-06 LAB — PREGNANCY, URINE: Preg Test, Ur: NEGATIVE

## 2020-04-06 MED ORDER — SODIUM CHLORIDE 0.9 % IV BOLUS
1000.0000 mL | Freq: Once | INTRAVENOUS | Status: AC
  Administered 2020-04-06: 1000 mL via INTRAVENOUS

## 2020-04-06 MED ORDER — PROMETHAZINE HCL 25 MG/ML IJ SOLN
12.5000 mg | Freq: Once | INTRAMUSCULAR | Status: AC
  Administered 2020-04-06: 12.5 mg via INTRAVENOUS
  Filled 2020-04-06: qty 1

## 2020-04-06 MED ORDER — PROMETHAZINE HCL 25 MG PO TABS: 12.5000 mg | ORAL_TABLET | Freq: Three times a day (TID) | ORAL | 0 refills | Status: DC | PRN

## 2020-04-06 MED ORDER — IOHEXOL 300 MG/ML  SOLN
100.0000 mL | Freq: Once | INTRAMUSCULAR | Status: AC | PRN
  Administered 2020-04-06: 100 mL via INTRAVENOUS

## 2020-04-06 NOTE — ED Triage Notes (Signed)
Pt having dysuria for a few days.  Pt feels like it may be a uti.  Some N/V in 24 hours.

## 2020-04-06 NOTE — ED Provider Notes (Signed)
[facility] EMERGENCY DEPARTMENT Provider Note   CSN: [id number] Arrival date & time: 04/06/20  0623     History Chief Complaint  Patient presents with  . Dysuria    Victoria Travis is a 54 y.o. female.  HPI Patient presents with dysuria nausea vomiting.  States that for couple days ago she had an exposure to some to some stool and thinks she could have gotten a UTI from it.  States she had an episode of irritable bowel in the car and could not get cleaned up immediately.  Since then she has had some dysuria.  However she also developed for the last day some nausea and vomiting.  Had a bowel movement yesterday that was not diarrhea.  Feels a little lightheaded.  Aches all over.  Does have lower abdominal pain that has gotten severe also.  No relief with Zofran at home.  Patient states she gets urinary tract infections 2-3 times a year.    Past Medical History:  Diagnosis Date  . Alcohol dependence (HCC)   . Depression   . IBS (irritable bowel syndrome)   . Thyroid disease     Patient Active Problem List   Diagnosis Date Noted  . Alcohol dependence (HCC) 03/07/2018  . MDD (major depressive disorder), severe (HCC) 03/06/2018  . 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 Cellulitis of foot 06/11/2012    No past surgical history on file.   OB History   No obstetric history on file.     No family history on file.  Social History   Tobacco Use  . Smoking status: Current Every Day Smoker    Types: Cigarettes  . Smokeless tobacco: Never Used  . Tobacco comment: pt reports she just started   Vaping Use  . Vaping Use: Never used  Substance Use Topics  . Alcohol use: Yes    Alcohol/week: 14.0 standard drinks    Types: 14 Shots of liquor per week    Comment: 1/2 gallon vodka every 9 days - drinks daily - 4 large drinks  . Drug use: No    Home Medications Prior to Admission medications   Medication Sig Start Date End Date Taking? Authorizing Provider    albuterol (VENTOLIN HFA) 108 (90 Base) MCG/ACT inhaler Inhale 1-2 puffs into the lungs every 6 (six) hours as needed for wheezing or shortness of breath. 08/02/19   Henderly, Britni A, PA-C  azithromycin (ZITHROMAX) 250 MG tablet Take 1 tablet (250 mg total) by mouth daily. Take first 2 tablets together, then 1 every day until finished. 08/26/19   Terrilee Files, MD  cyclobenzaprine (FLEXERIL) 10 MG tablet Take 1 tablet (10 mg total) by mouth 3 (three) times daily as needed for muscle spasms. 05/16/19   Myles Lipps, MD  EUTHYROX 100 MCG tablet TAKE 1 TABLET BY MOUTH ONCE DAILY BEFORE BREAKFAST FOR  HYPOTHYROIDISM 03/04/20   Janeece Agee, NP  meloxicam (MOBIC) 15 MG tablet Take 1 tablet (15 mg total) by mouth daily. 05/16/19   Myles Lipps, MD  nitrofurantoin, macrocrystal-monohydrate, (MACROBID) 100 MG capsule Take 1 capsule (100 mg total) by mouth 2 (two) times daily. 08/02/19   Henderly, Britni A, PA-C  predniSONE (DELTASONE) 20 MG tablet Take 3 tablets (60 mg total) by mouth daily. 08/26/19   Terrilee Files, MD  promethazine (PHENERGAN) 25 MG tablet Take 0.5-1 tablets (12.5-25 mg total) by mouth every 8 (eight) hours as needed for nausea. 04/06/20   Benjiman Core, MD    Allergies  Patient has no known allergies.  Review of Systems   Review of Systems  Constitutional: Positive for appetite change. Negative for fever.  HENT: Negative for congestion.   Respiratory: Negative for shortness of breath.   Cardiovascular: Negative for chest pain.  Gastrointestinal: Positive for nausea and vomiting.  Genitourinary: Positive for dysuria. Negative for flank pain.  Musculoskeletal: Negative for back pain.  Skin: Negative for rash.  Neurological: Positive for weakness.    Physical Exam Updated Vital Signs BP (!) 130/99 (BP Location: Left Arm)   Pulse 87   Temp 98.3 F (36.8 C) (Oral)   Resp 18   Ht 5\' 5"  (1.651 m)   Wt 61.2 kg   LMP 03/29/2020   SpO2 100%   BMI 22.47  kg/m   Physical Exam Vitals and nursing note reviewed.  HENT:     Head: Normocephalic.  Eyes:     Extraocular Movements: Extraocular movements intact.  Cardiovascular:     Rate and Rhythm: Normal rate and regular rhythm.  Abdominal:     Comments: Patient is actively vomiting.  Lower abdominal tenderness without rebound or guarding.  Musculoskeletal:        General: No tenderness.     Cervical back: Neck supple.  Skin:    General: Skin is warm.     Capillary Refill: Capillary refill takes less than 2 seconds.  Neurological:     Mental Status: She is alert and oriented to person, place, and time.     ED Results / Procedures / Treatments   Labs (all labs ordered are listed, but only abnormal results are displayed) Labs Reviewed  URINALYSIS, ROUTINE W REFLEX MICROSCOPIC - Abnormal; Notable for the following components:      Result Value   Specific Gravity, Urine >1.030 (*)    Hgb urine dipstick MODERATE (*)    Ketones, ur >80 (*)    Protein, ur 30 (*)    All other components within normal limits  CBC WITH DIFFERENTIAL/PLATELET - Abnormal; Notable for the following components:   RDW 17.7 (*)    All other components within normal limits  URINALYSIS, MICROSCOPIC (REFLEX) - Abnormal; Notable for the following components:   Bacteria, UA FEW (*)    All other components within normal limits  COMPREHENSIVE METABOLIC PANEL - Abnormal; Notable for the following components:   Chloride 93 (*)    CO2 16 (*)    Glucose, Bld 134 (*)    Calcium 8.8 (*)    AST 142 (*)    ALT 115 (*)    Total Bilirubin 2.2 (*)    Anion gap 26 (*)    All other components within normal limits  PREGNANCY, URINE  LIPASE, BLOOD    EKG None  Radiology CT ABDOMEN PELVIS W CONTRAST  Result Date: 04/06/2020 CLINICAL DATA:  Nausea, vomiting and abdominal pain for 3 days. EXAM: CT ABDOMEN AND PELVIS WITH CONTRAST TECHNIQUE: Multidetector CT imaging of the abdomen and pelvis was performed using the  standard protocol following bolus administration of intravenous contrast. CONTRAST:  100 mL OMNIPAQUE IOHEXOL 300 MG/ML  SOLN COMPARISON:  CT abdomen and pelvis 02/08/2017. FINDINGS: Lower chest: Lung bases clear.  No pleural or pericardial effusion. Hepatobiliary: No focal liver abnormality is seen. No gallstones, gallbladder wall thickening, or biliary dilatation. Marked, diffuse fatty infiltration of the liver noted. Pancreas: Unremarkable. No pancreatic ductal dilatation or surrounding inflammatory changes. Spleen: Normal in size without focal abnormality. Adrenals/Urinary Tract: Adrenal glands are unremarkable. No renal calculi, worrisome lesion,  or hydronephrosis. Bilateral parapelvic renal cysts noted. Bladder is unremarkable. Stomach/Bowel: Stomach is within normal limits. Small hiatal hernia noted. Appendix appears normal. No evidence of bowel wall thickening, distention, or inflammatory changes. Diverticulosis is most notable along the descending and sigmoid colon. Vascular/Lymphatic: No significant vascular findings are present. No enlarged abdominal or pelvic lymph nodes. Reproductive: Uterus and bilateral adnexa are unremarkable. Other: None. Musculoskeletal: No acute or focal abnormality. IMPRESSION: Diverticulosis without diverticulitis. No acute abnormality abdomen or pelvis. Diffuse fatty infiltration of the liver. Small hiatal hernia. Electronically Signed   By: Drusilla Kanner M.D.   On: 04/06/2020 12:06    Procedures Procedures (including critical care time)  Medications Ordered in ED Medications  sodium chloride 0.9 % bolus 1,000 mL (0 mLs Intravenous Stopped 04/06/20 1244)  promethazine (PHENERGAN) injection 12.5 mg (12.5 mg Intravenous Given 04/06/20 1059)  iohexol (OMNIPAQUE) 300 MG/ML solution 100 mL (100 mLs Intravenous Contrast Given 04/06/20 1141)    ED Course  I have reviewed the triage vital signs and the nursing notes.  Pertinent labs & imaging results that were available  during my care of the patient were reviewed by me and considered in my medical decision making (see chart for details).    MDM Rules/Calculators/A&P                          Patient with dysuria abdominal pain nausea and vomiting.  Think she has urinary tract infection although urinalysis is reassuring.  Patient feels better after treatment.  However with pain CT scan done and was overall reassuring.  Patient's been tolerating orals here.  Think she is stable for discharge home with antiemetics.  Outpatient follow-up as needed. Final Clinical Impression(s) / ED Diagnoses Final diagnoses:  Non-intractable vomiting with nausea, unspecified vomiting type  Dysuria  Dehydration    Rx / DC Orders ED Discharge Orders         Ordered    promethazine (PHENERGAN) 25 MG tablet  Every 8 hours PRN     Discontinue  Reprint     04/06/20 1349           Benjiman Core, MD 04/06/20 1505

## 2020-04-20 ENCOUNTER — Other Ambulatory Visit: Payer: Self-pay | Admitting: Registered Nurse

## 2020-04-21 NOTE — Telephone Encounter (Signed)
04/21/2020 - PATIENT REQUESTED A REFILL ON HER EUTHYROX 100 MCG TABLET. SHE WAS GIVEN A COURTESY 30 DAY SUPPLY BUT WILL NOT BE ABLE TO GET ANY MORE UNTIL AN APPOINTMENT IS SCHEDULED WITH DR. Darcel Bayley. I TRIED TO CALL AND SCHEDULE BUT HAD TO LEAVE A VOICE MAIL FOR HER TO RETURN MY CALL. I WILL NOT ROUTE THIS MESSAGE. MBC

## 2020-04-21 NOTE — Telephone Encounter (Signed)
Please schedule patient an appt for f/u and medication refills. No further refills without appt

## 2020-04-23 ENCOUNTER — Ambulatory Visit: Payer: Self-pay | Admitting: Family Medicine

## 2020-04-26 ENCOUNTER — Encounter: Payer: Self-pay | Admitting: Family Medicine

## 2020-06-04 ENCOUNTER — Other Ambulatory Visit: Payer: Self-pay

## 2020-06-04 ENCOUNTER — Telehealth: Payer: Self-pay

## 2020-06-04 MED ORDER — LEVOTHYROXINE SODIUM 100 MCG PO TABS: ORAL_TABLET | ORAL | 0 refills | Status: DC

## 2020-06-04 NOTE — Telephone Encounter (Signed)
done

## 2020-06-04 NOTE — Telephone Encounter (Signed)
Pt. Called to schedule med refill with PCP [place]. Appt made for 9/9. Pt asserted they would run out of medications before that appt. Pt. Was unable to specify medications.

## 2020-06-17 ENCOUNTER — Other Ambulatory Visit: Payer: Self-pay

## 2020-06-17 ENCOUNTER — Ambulatory Visit (INDEPENDENT_AMBULATORY_CARE_PROVIDER_SITE_OTHER): Payer: Self-pay | Admitting: Family Medicine

## 2020-06-17 ENCOUNTER — Encounter: Payer: Self-pay | Admitting: Family Medicine

## 2020-06-17 VITALS — BP 135/90 | HR 72 | Temp 98.4°F | Ht 65.0 in | Wt 139.4 lb

## 2020-06-17 DIAGNOSIS — E038 Other specified hypothyroidism: Secondary | ICD-10-CM

## 2020-06-17 DIAGNOSIS — E063 Autoimmune thyroiditis: Secondary | ICD-10-CM

## 2020-06-17 MED ORDER — LEVOTHYROXINE SODIUM 100 MCG PO TABS: ORAL_TABLET | ORAL | 3 refills | Status: DC

## 2020-06-17 NOTE — Patient Instructions (Signed)
° ° ° °  If you have lab work done today you will be contacted with your lab results within the next 2 weeks.  If you have not heard from us then please contact us. The fastest way to get your results is to register for My Chart. ° ° °IF you received an x-ray today, you will receive an invoice from [facility]. Please contact [facility] at [phone] with questions or concerns regarding your invoice.  ° °IF you received labwork today, you will receive an invoice from [facility]. Please contact [facility] at [phone] with questions or concerns regarding your invoice.  ° °Our billing staff will not be able to assist you with questions regarding bills from these companies. ° °You will be contacted with the lab results as soon as they are available. The fastest way to get your results is to activate your My Chart account. Instructions are located on the last page of this paperwork. If you have not heard from us regarding the results in 2 weeks, please contact this office. °  ° ° ° °

## 2020-06-17 NOTE — Progress Notes (Signed)
9/9/20219:31 AM  Victoria Travis 1966/08/08, 54 y.o., female [id number]  Chief Complaint  Patient presents with  . Hypothyroidism    med refill    HPI:   Patient is a 54 y.o. female with past medical history significant for hypothroidism who presents today for routine followup  Seen once a year She was diagnosed with hashimoto's many years ago She has been on current dose for years She denies any changes in skin/hair/temp tolerance/mood/weight Bowels alternate pending IBS She denies any thyroid enlargement or pain She denies any obstructive symptoms She is going thru perimenopause She sun bathes, she uses sunscreen   Lab Results  Component Value Date   TSH 0.541 08/02/2019    Depression screen [facility] 2/9 06/09/2019 06/02/2019 05/16/2019  Decreased Interest 0 0 0  Down, Depressed, Hopeless 0 0 0  PHQ - 2 Score 0 0 0    Fall Risk  06/17/2020 06/09/2019 06/02/2019 05/16/2019 09/27/2018  Falls in the past year? 0 1 1 0 0  Number falls in past yr: 0 0 0 0 -  Injury with Fall? 0 1 1 0 -     No Known Allergies  Prior to Admission medications   Medication Sig Start Date End Date Taking? Authorizing Provider  levothyroxine (EUTHYROX) 100 MCG tablet TAKE 1 TABLET BY MOUTH ONCE DAILY BEFORE BREAKFAST FOR  HYPOTHYROIDISM 06/04/20  Yes Myles Lipps, MD    Past Medical History:  Diagnosis Date  . Alcohol dependence (HCC)   . Depression   . IBS (irritable bowel syndrome)   . Thyroid disease     History reviewed. No pertinent surgical history.  Social History   Tobacco Use  . Smoking status: Current Every Day Smoker    Types: Cigarettes  . Smokeless tobacco: Never Used  . Tobacco comment: pt reports she just started   Substance Use Topics  . Alcohol use: Yes    Alcohol/week: 14.0 standard drinks    Types: 14 Shots of liquor per week    Comment: 1/2 gallon vodka every 9 days - drinks daily - 4 large drinks    History reviewed. No pertinent family history.  ROS Per  hpi  OBJECTIVE:  Today's Vitals   06/17/20 0921  BP: 135/90  Pulse: 72  Temp: 98.4 F (36.9 C)  SpO2: 99%  Weight: 139 lb 6.4 oz (63.2 kg)  Height: 5\' 5"  (1.651 m)   Body mass index is 23.2 kg/m.   Wt Readings from Last 3 Encounters:  06/17/20 139 lb 6.4 oz (63.2 kg)  04/06/20 135 lb (61.2 kg)  12/29/19 143 lb 1.6 oz (64.9 kg)     Physical Exam Vitals and nursing note reviewed.  Constitutional:      Appearance: She is well-developed.  HENT:     Head: Normocephalic and atraumatic.     Mouth/Throat:     Pharynx: No oropharyngeal exudate.  Eyes:     General: No scleral icterus.    Conjunctiva/sclera: Conjunctivae normal.     Pupils: Pupils are equal, round, and reactive to light.  Neck:     Thyroid: No thyroid mass, thyromegaly or thyroid tenderness.  Cardiovascular:     Rate and Rhythm: Normal rate and regular rhythm.     Heart sounds: Normal heart sounds. No murmur heard.  No friction rub. No gallop.   Pulmonary:     Effort: Pulmonary effort is normal.     Breath sounds: Normal breath sounds. No wheezing or rales.  Musculoskeletal:  Cervical back: Neck supple.  Skin:    General: Skin is warm and dry.     Comments: Tanned, a ~ 33mm excoriated papule middle of back  Neurological:     Mental Status: She is alert and oriented to person, place, and time.       No results found for this or any previous visit (from the past 24 hour(s)).  No results found.   ASSESSMENT and PLAN  1. Hypothyroidism due to Hashimoto's thyroiditis Checking labs today, medications will be adjusted as needed.  - levothyroxine (EUTHYROX) 100 MCG tablet; TAKE 1 TABLET BY MOUTH ONCE DAILY BEFORE BREAKFAST FOR  HYPOTHYROIDISM - TSH  Discussed skin care, mole changes to be aware of, monitor papule on back, if does not resolve over next month, would recommend biopsy  Return in about 1 year (around 06/17/2021).    Myles Lipps, MD Primary Care at [facility] [address] [place], [place] [address] Ph.  [phone] Fax [phone]

## 2020-06-18 LAB — TSH: TSH: 21 u[IU]/mL — ABNORMAL HIGH (ref 0.450–4.500)

## 2020-06-21 IMAGING — CR DG CHEST 2V
2 series · 2 of 2 positions shown · non-contrast
Comparison: January 29, 2019

CLINICAL DATA: Shortness of breath

EXAM:
CHEST - 2 VIEW

[w chest pa]
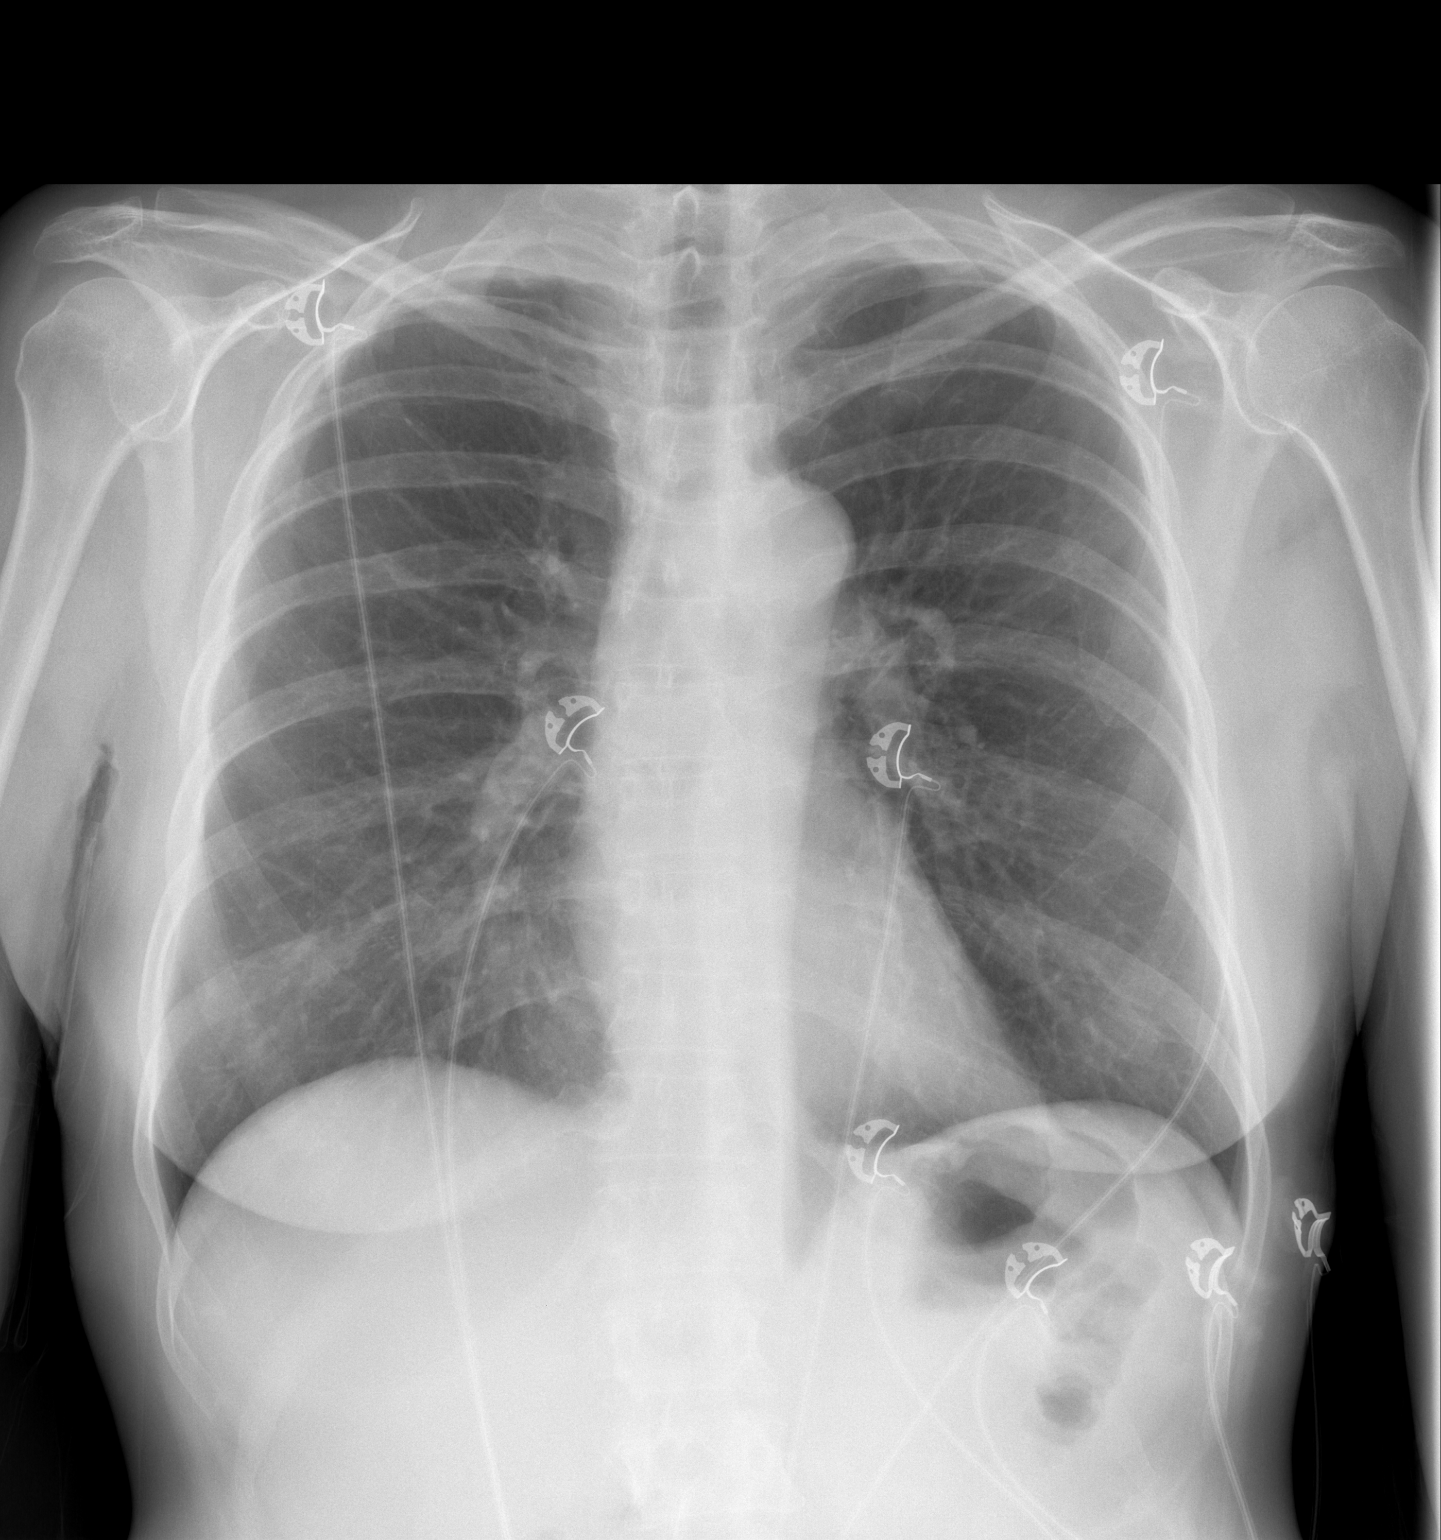

[w chest lat]
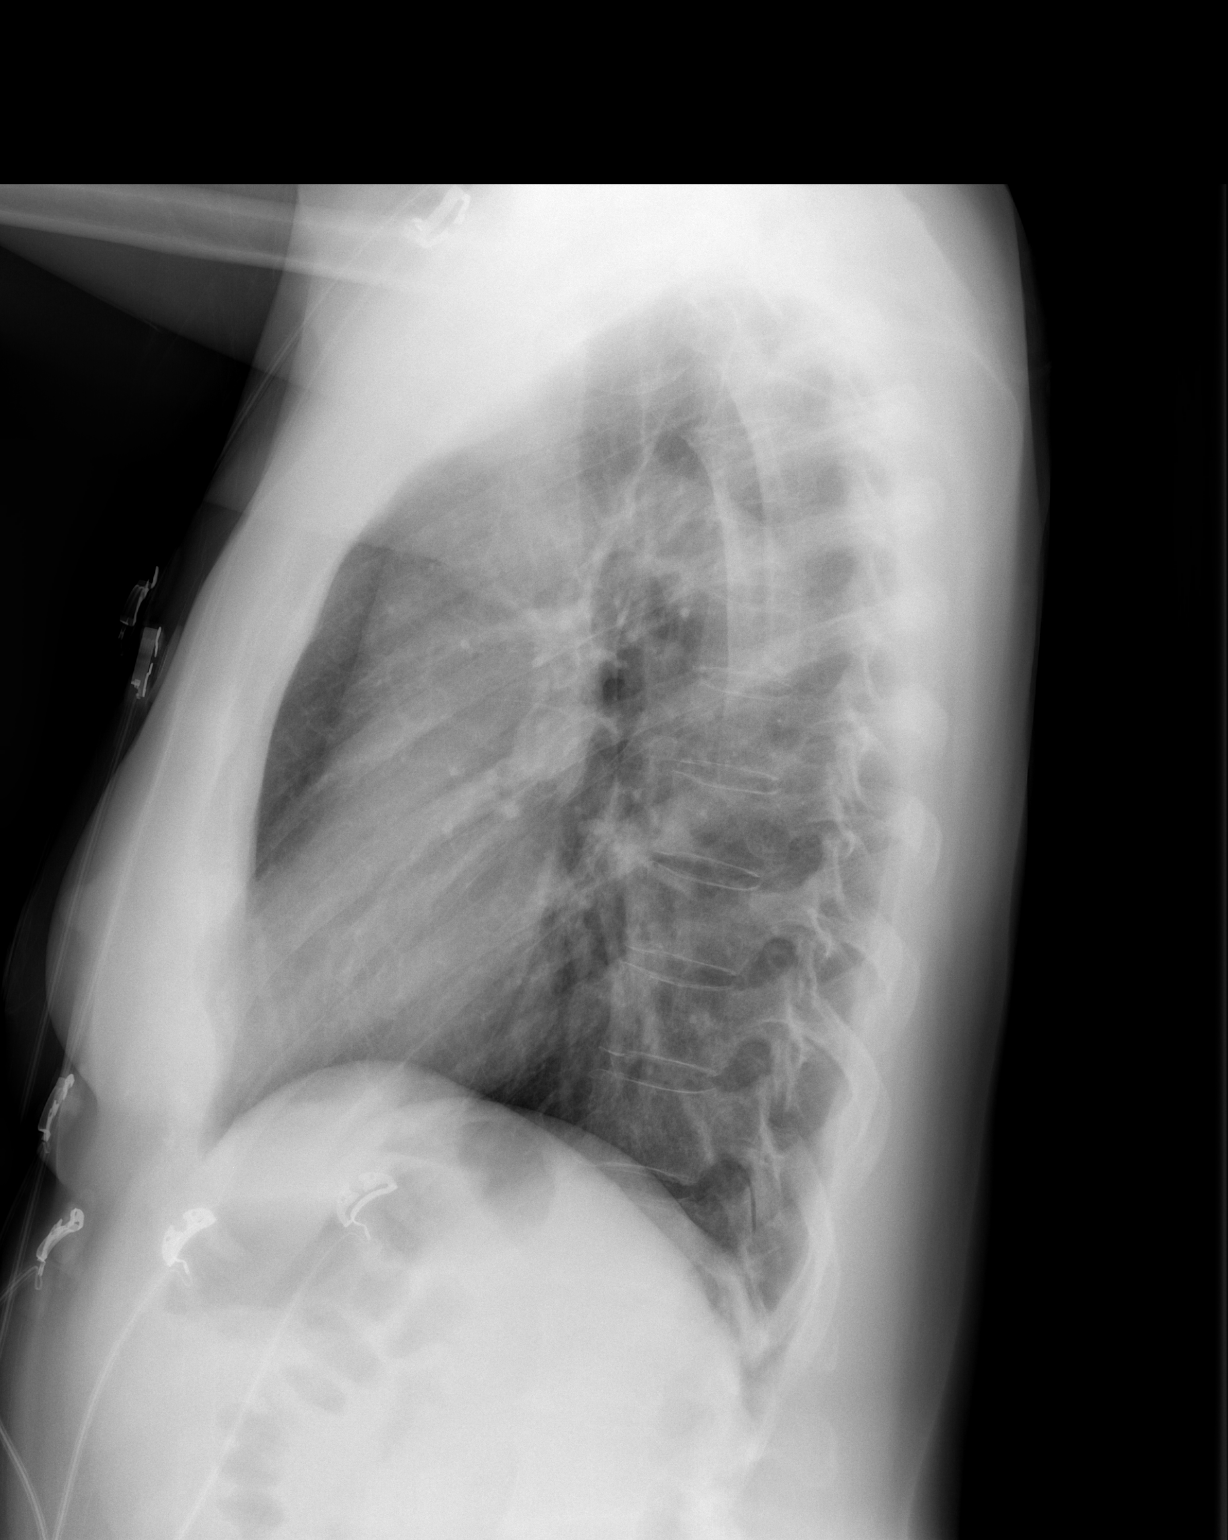

[2 of 2 positions shown; findings below may reference images not displayed]

FINDINGS: No edema or consolidation. Heart size and pulmonary vascularity are
normal. No adenopathy. No pneumothorax. No bone lesions.
IMPRESSION: No edema or consolidation.

## 2020-06-28 ENCOUNTER — Other Ambulatory Visit: Payer: Self-pay | Admitting: Family Medicine

## 2020-06-28 DIAGNOSIS — E038 Other specified hypothyroidism: Secondary | ICD-10-CM

## 2020-06-28 DIAGNOSIS — E063 Autoimmune thyroiditis: Secondary | ICD-10-CM

## 2020-06-28 MED ORDER — LEVOTHYROXINE SODIUM 125 MCG PO TABS: 125.0000 ug | ORAL_TABLET | Freq: Every day | ORAL | 1 refills | Status: DC

## 2020-06-28 NOTE — Addendum Note (Signed)
Addended by: Myles Lipps on: 06/28/2020 04:59 PM   Modules accepted: Orders

## 2020-07-01 ENCOUNTER — Telehealth: Payer: Self-pay | Admitting: *Deleted

## 2020-07-01 NOTE — Telephone Encounter (Signed)
Schedule 9-27 mobile mammogram unit @ Primary Care at [facility].  Sunny Schlein or Raynelle Fanning

## 2020-11-17 IMAGING — DX DG CHEST 1V PORT
1 series · 1 of 1 positions shown · non-contrast
Comparison: 08/26/2019

CLINICAL DATA: Shortness of breath

EXAM:
PORTABLE CHEST 1 VIEW

[chest ap]
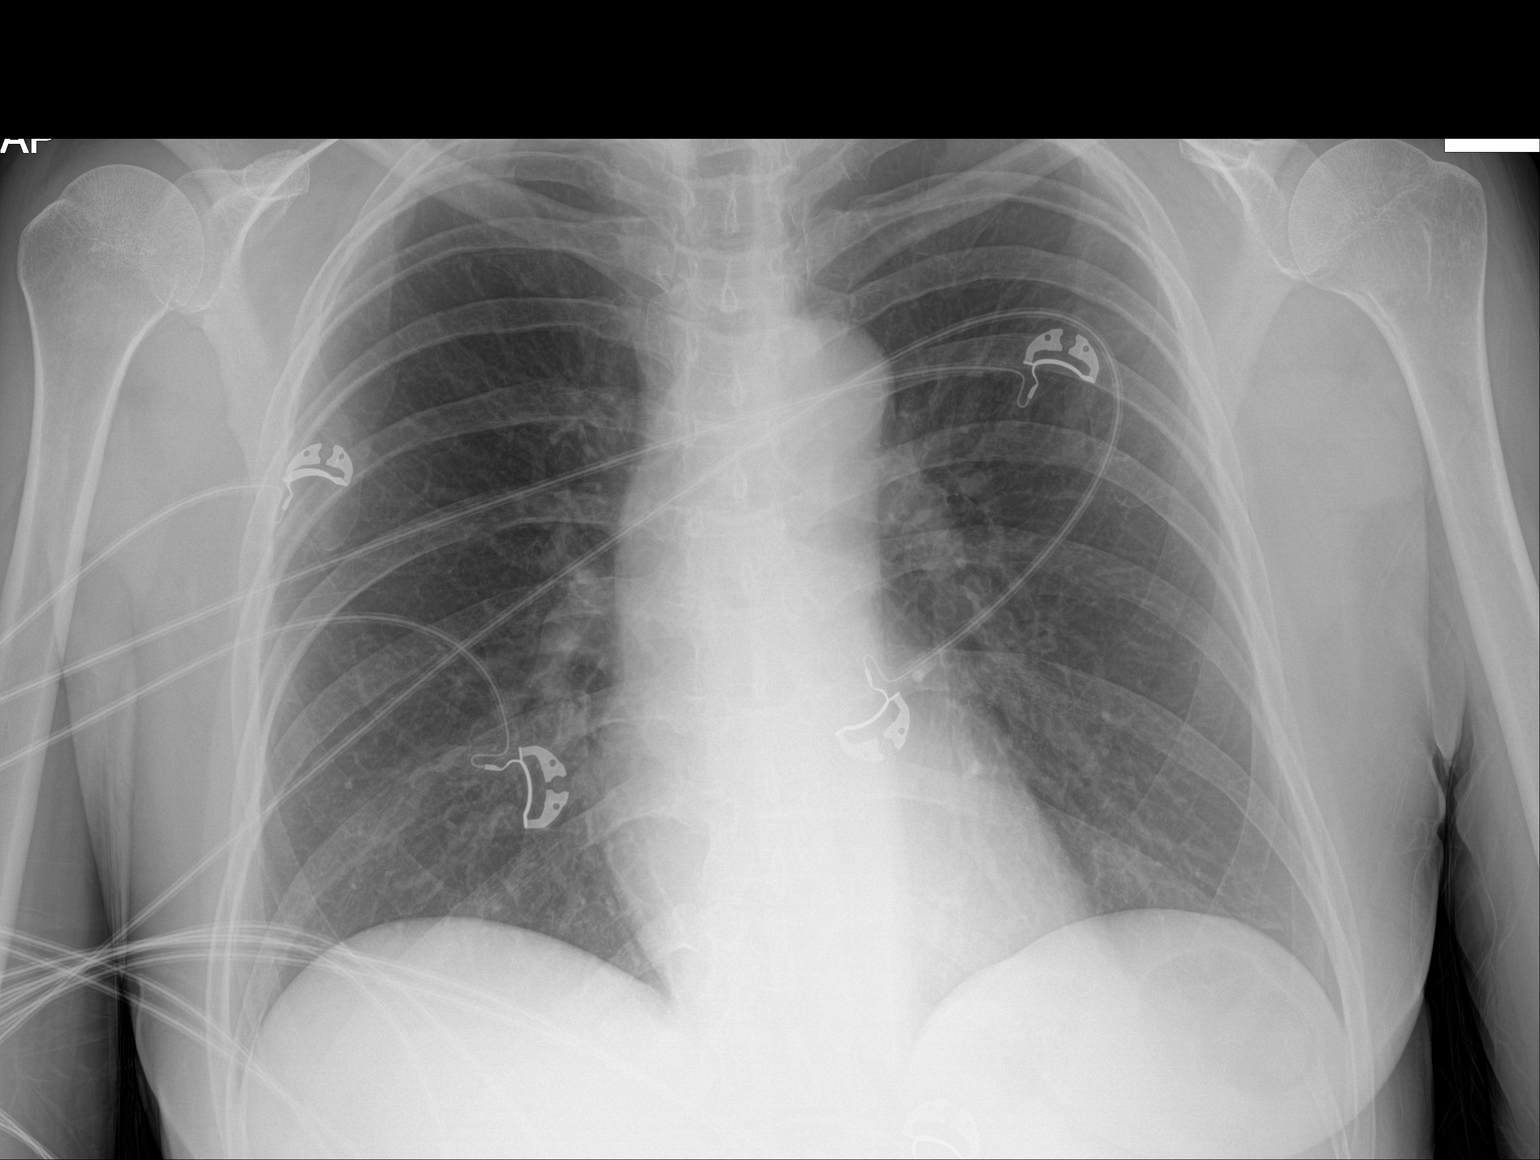

[1 of 1 positions shown; findings below may reference images not displayed]

FINDINGS: The heart size and mediastinal contours are within normal limits.
Both lungs are clear. The visualized skeletal structures are
unremarkable.
IMPRESSION: No active disease.

## 2021-02-01 ENCOUNTER — Emergency Department
Admission: EM | Admit: 2021-02-01 | Discharge: 2021-02-01 | Disposition: A | Discharge status: Home / Self Care | Payer: Self-pay | Source: Home / Self Care

## 2021-02-01 DIAGNOSIS — F411 Generalized anxiety disorder: Secondary | ICD-10-CM

## 2021-02-01 DIAGNOSIS — R000 Tachycardia, unspecified: Secondary | ICD-10-CM

## 2021-02-01 NOTE — ED Provider Notes (Signed)
[name]    CSN: [id number] Arrival date & time: 02/01/21  1636      History   Chief Complaint Chief Complaint  Patient presents with  . Panic Attack    anxiety    HPI Victoria Travis is a 55 y.o. female.   Reports that she had a panic attack earlier today.  Also reports that she had an episode of suicidal ideation that she said "slipped out in a moment of lost control."  States that she was on the phone with [employer] crisis hotline when episode happened.  States that someone from [facility] came to her house, said that she needed to seek treatment or she was going to be involuntarily committed to the hospital.  Patient decline having suicidal ideation or homicidal ideation at this time.  Denies having suicidal plan.  Reports that she did "blurt out that I was going to kill myself" when I was on the phone with that guy earlier.  Denies taking medications for anxiety or panic attacks.  States that she smokes cigarettes.  States that she also smokes marijuana at times.  States that she did not do this today.  Denies headache, cough, nausea, vomiting, diarrhea, rash, fever, other symptoms.  ROS per HPI  The history is provided by the patient.    Past Medical History:  Diagnosis Date  . Alcohol dependence (HCC)   . Depression   . Hypothyroidism 06/11/2012  . Hypothyroidism 06/11/2012  . IBS (irritable bowel syndrome)   . Thyroid disease     Patient Active Problem List   Diagnosis Date Noted  . Hypothyroidism due to Hashimoto's thyroiditis 06/17/2020  . Alcohol dependence (HCC) 03/07/2018  . MDD (major depressive disorder), severe (HCC) 03/06/2018  . Cellulitis of foot 06/11/2012    History reviewed. No pertinent surgical history.  OB History   No obstetric history on file.      Home Medications    Prior to Admission medications   Medication Sig Start Date End Date Taking? Authorizing Provider  levothyroxine (EUTHYROX) 125 MCG tablet Take 1 tablet (125 mcg  total) by mouth daily before breakfast. 06/28/20   Lezlie Lye, Meda Coffee, MD    Family History History reviewed. No pertinent family history.  Social History Social History   Tobacco Use  . Smoking status: Current Every Day Smoker    Types: Cigarettes  . Smokeless tobacco: Never Used  . Tobacco comment: pt reports she just started   Vaping Use  . Vaping Use: Never used  Substance Use Topics  . Alcohol use: Yes    Alcohol/week: 14.0 standard drinks    Types: 14 Shots of liquor per week    Comment: 1/2 gallon vodka every 9 days - drinks daily - 4 large drinks  . Drug use: No     Allergies   Patient has no known allergies.   Review of Systems Review of Systems   Physical Exam Triage Vital Signs ED Triage Vitals  Enc Vitals Group     BP 02/01/21 1646 (!) 142/110     Pulse Rate 02/01/21 1646 (!) 113     Resp 02/01/21 1646 18     Temp 02/01/21 1646 98.6 F (37 C)     Temp Source 02/01/21 1646 Oral     SpO2 02/01/21 1646 97 %     Weight --      Height --      Head Circumference --      Peak Flow --  Pain Score 02/01/21 1649 0     Pain Loc --      Pain Edu? --      Excl. in GC? --    No data found.  Updated Vital Signs BP (!) 142/110 (BP Location: Right Arm)   Pulse (!) 113   Temp 98.6 F (37 C) (Oral)   Resp 18   SpO2 97%   Visual Acuity Right Eye Distance:   Left Eye Distance:   Bilateral Distance:    Right Eye Near:   Left Eye Near:    Bilateral Near:     Physical Exam Vitals and nursing note reviewed. Exam conducted with a chaperone present Albertine Grates, Art gallery manager).  Constitutional:      General: She is not in acute distress.    Appearance: Normal appearance. She is well-developed. She is not ill-appearing.  HENT:     Head: Normocephalic and atraumatic.     Nose: Nose normal.     Mouth/Throat:     Mouth: Mucous membranes are moist.     Pharynx: Oropharynx is clear.  Eyes:     Extraocular Movements: Extraocular  movements intact.     Conjunctiva/sclera: Conjunctivae normal.     Pupils: Pupils are equal, round, and reactive to light.  Cardiovascular:     Rate and Rhythm: Normal rate and regular rhythm.     Heart sounds: Normal heart sounds. No murmur heard.   Pulmonary:     Effort: Pulmonary effort is normal. No respiratory distress.     Breath sounds: Normal breath sounds. No stridor. No wheezing, rhonchi or rales.  Chest:     Chest wall: No tenderness.  Abdominal:     General: Bowel sounds are normal.     Palpations: Abdomen is soft.     Tenderness: There is no abdominal tenderness.  Musculoskeletal:        General: Normal range of motion.     Cervical back: Normal range of motion and neck supple.  Skin:    General: Skin is warm and dry.     Capillary Refill: Capillary refill takes less than 2 seconds.  Neurological:     General: No focal deficit present.     Mental Status: She is alert and oriented to person, place, and time.  Psychiatric:        Attention and Perception: Attention and perception normal.        Mood and Affect: Mood is anxious.        Speech: Speech normal.        Behavior: Behavior normal. Behavior is cooperative.        Thought Content: Thought content does not include homicidal or suicidal ideation. Thought content does not include homicidal or suicidal plan.        Cognition and Memory: Cognition and memory normal.      UC Treatments / Results  Labs (all labs ordered are listed, but only abnormal results are displayed) Labs Reviewed - No data to display  EKG   Radiology No results found.  Procedures Procedures (including critical care time)  Medications Ordered in UC Medications - No data to display  Initial Impression / Assessment and Plan / UC Course  I have reviewed the triage vital signs and the nursing notes.  Pertinent labs & imaging results that were available during my care of the patient were reviewed by me and considered in my medical  decision making (see chart for details).    Anxiety state Tachycardia  Discussed [facility] has a behavioral health urgent care that is open 24/7 Discussed that she would be best served there at this time Also discussed that there are options online for therapist to be available anytime she wanted to sign on States that she was going to look into both of these States that she is over her panic attack, and that it was a moment that has since passed Patient displays appropriate thought content and judgment in the office Declines suicidal, homicidal ideation or plan Discussed that if she did become suicidal, homicidal that she should follow-up in the ER for more acute intervention   Final Clinical Impressions(s) / UC Diagnoses   Final diagnoses:  Anxiety state  Tachycardia     Discharge Instructions     I think that you may be best served being seen at the [facility] in [facility]  I think that they would be able to help you more at this location.   Follow up in the ER for suicidal thoughts        ED Prescriptions    None     PDMP not reviewed this encounter.   Moshe Cipro, NP 02/01/21 [phone]

## 2021-02-01 NOTE — ED Triage Notes (Addendum)
Pt c/o panic attack earlier. Has since subsided. Not aware of any triggers. Ended up calling a suicide hotline. Says the hotline advised her to come get checked out. Says she is not suicidal, that she just "had a moment". Hotline requesting callback as wellness check.

## 2021-02-01 NOTE — Discharge Instructions (Addendum)
I think that you may be best served being seen at the [facility] in [facility].  I think that they would be able to help you more at this location.   Follow up in the ER for suicidal thoughts

## 2021-02-05 ENCOUNTER — Emergency Department
Admission: EM | Admit: 2021-02-05 | Discharge: 2021-02-05 | Disposition: A | Discharge status: Home / Self Care | Payer: Self-pay | Source: Home / Self Care

## 2021-02-05 ENCOUNTER — Encounter: Payer: Self-pay | Admitting: Emergency Medicine

## 2021-02-05 DIAGNOSIS — E038 Other specified hypothyroidism: Secondary | ICD-10-CM

## 2021-02-05 DIAGNOSIS — E039 Hypothyroidism, unspecified: Secondary | ICD-10-CM

## 2021-02-05 DIAGNOSIS — Z760 Encounter for issue of repeat prescription: Secondary | ICD-10-CM

## 2021-02-05 MED ORDER — LEVOTHYROXINE SODIUM 125 MCG PO TABS
125.0000 ug | ORAL_TABLET | Freq: Every day | ORAL | 0 refills | Status: DC
Start: 2021-02-05 — End: 2021-02-16

## 2021-02-05 NOTE — Discharge Instructions (Addendum)
Refilled levothyroxine  Take as prescribed  Follow-up with primary care as scheduled  Follow up with this office or with primary care if symptoms are persisting.  Follow up in the ER for high fever, trouble swallowing, trouble breathing, other concerning symptoms.

## 2021-02-05 NOTE — ED Provider Notes (Signed)
[name]    CSN: [id number] Arrival date & time: 02/05/21  4098      History   Chief Complaint Chief Complaint  Patient presents with  . Medication refill    HPI Victoria Travis is a 55 y.o. female.   Reports that she needs a refill for her levothyroxine.  She takes 125 mcg daily.  She does not have an established primary care physician.  She was a previous patient at [facility].  She does have an appointment with [facility] next week.  Declines thyroid testing today.  Denies headache, cough, abdominal pain, nausea, vomiting, diarrhea, rash, fever, other symptoms.  Of note, this patient was seen by me earlier this week and was sent to urgent care by [facility] crisis hotline.  She denies homicidal or suicidal ideations today.  ROS per HPI  The history is provided by the patient.    Past Medical History:  Diagnosis Date  . Alcohol dependence (HCC)   . Depression   . Hypothyroidism 06/11/2012  . Hypothyroidism 06/11/2012  . IBS (irritable bowel syndrome)   . Thyroid disease     Patient Active Problem List   Diagnosis Date Noted  . Hypothyroidism due to Hashimoto's thyroiditis 06/17/2020  . Alcohol dependence (HCC) 03/07/2018  . MDD (major depressive disorder), severe (HCC) 03/06/2018  . Cellulitis of foot 06/11/2012    History reviewed. No pertinent surgical history.  OB History   No obstetric history on file.      Home Medications    Prior to Admission medications   Medication Sig Start Date End Date Taking? Authorizing Provider  levothyroxine (EUTHYROX) 125 MCG tablet Take 1 tablet (125 mcg total) by mouth daily before breakfast. 02/05/21 03/07/21  Moshe Cipro, NP    Family History No family history on file.  Social History Social History   Tobacco Use  . Smoking status: Current Every Day Smoker    Types: Cigarettes  . Smokeless tobacco: Never Used  . Tobacco comment: pt reports she just started   Vaping Use   . Vaping Use: Never used  Substance Use Topics  . Alcohol use: Yes    Alcohol/week: 14.0 standard drinks    Types: 14 Shots of liquor per week    Comment: 1/2 gallon vodka every 9 days - drinks daily - 4 large drinks  . Drug use: No     Allergies   Patient has no known allergies.   Review of Systems Review of Systems   Physical Exam Triage Vital Signs ED Triage Vitals  Enc Vitals Group     BP 02/05/21 1013 (!) 149/94     Pulse Rate 02/05/21 1013 87     Resp --      Temp --      Temp src --      SpO2 02/05/21 1013 99 %     Weight --      Height --      Head Circumference --      Peak Flow --      Pain Score 02/05/21 1014 0     Pain Loc --      Pain Edu? --      Excl. in GC? --    No data found.  Updated Vital Signs BP (!) 149/94 (BP Location: Left Arm)   Pulse 87   SpO2 99%   Visual Acuity Right Eye Distance:   Left Eye Distance:   Bilateral Distance:    Right Eye  Near:   Left Eye Near:    Bilateral Near:     Physical Exam Vitals and nursing note reviewed.  Constitutional:      General: She is not in acute distress.    Appearance: Normal appearance. She is well-developed and normal weight. She is not ill-appearing.  HENT:     Head: Normocephalic and atraumatic.     Mouth/Throat:     Mouth: Mucous membranes are moist.     Pharynx: Oropharynx is clear.  Eyes:     Extraocular Movements: Extraocular movements intact.     Conjunctiva/sclera: Conjunctivae normal.     Pupils: Pupils are equal, round, and reactive to light.  Cardiovascular:     Rate and Rhythm: Normal rate and regular rhythm.     Heart sounds: Normal heart sounds. No murmur heard.   Pulmonary:     Effort: Pulmonary effort is normal. No respiratory distress.     Breath sounds: Normal breath sounds. No stridor. No wheezing, rhonchi or rales.  Chest:     Chest wall: No tenderness.  Abdominal:     Palpations: Abdomen is soft.     Tenderness: There is no abdominal tenderness.   Musculoskeletal:        General: Normal range of motion.     Cervical back: Normal range of motion and neck supple.  Skin:    General: Skin is warm and dry.     Capillary Refill: Capillary refill takes less than 2 seconds.  Neurological:     General: No focal deficit present.     Mental Status: She is alert and oriented to person, place, and time.  Psychiatric:        Mood and Affect: Mood normal.        Behavior: Behavior normal.        Thought Content: Thought content normal.      UC Treatments / Results  Labs (all labs ordered are listed, but only abnormal results are displayed) Labs Reviewed - No data to display  EKG   Radiology No results found.  Procedures Procedures (including critical care time)  Medications Ordered in UC Medications - No data to display  Initial Impression / Assessment and Plan / UC Course  I have reviewed the triage vital signs and the nursing notes.  Pertinent labs & imaging results that were available during my care of the patient were reviewed by me and considered in my medical decision making (see chart for details).    Hypothyroidism Medication refill  Refilled levothyroxine 125 mcg daily During this visit, patient got set up and an appointment with [facility] Follow-up with primary care as scheduled Follow-up with this office as needed Follow-up the ER for high fever, trouble swallowing, trouble breathing, other concerns times  Final Clinical Impressions(s) / UC Diagnoses   Final diagnoses:  Medication refill  Hypothyroidism, unspecified type     Discharge Instructions     Refilled levothyroxine  Take as prescribed  Follow-up with primary care as scheduled  Follow up with this office or with primary care if symptoms are persisting.  Follow up in the ER for high fever, trouble swallowing, trouble breathing, other concerning symptoms.     ED Prescriptions    Medication Sig Dispense Auth. Provider    levothyroxine (EUTHYROX) 125 MCG tablet Take 1 tablet (125 mcg total) by mouth daily before breakfast. 30 tablet Moshe Cipro, NP     PDMP not reviewed this encounter.   Moshe Cipro, NP 02/08/21 [phone]

## 2021-02-05 NOTE — ED Triage Notes (Signed)
Patient states that she needs a refill on her Levothyroxin .  Patient's PCP's office closed without warning.  Patient has been without medication for a couple of days.

## 2021-02-15 ENCOUNTER — Encounter: Payer: Self-pay | Admitting: Medical-Surgical

## 2021-02-15 ENCOUNTER — Other Ambulatory Visit: Payer: Self-pay

## 2021-02-15 ENCOUNTER — Ambulatory Visit (INDEPENDENT_AMBULATORY_CARE_PROVIDER_SITE_OTHER): Payer: Self-pay | Admitting: Medical-Surgical

## 2021-02-15 VITALS — BP 137/99 | HR 103 | Temp 97.5°F | Ht 66.0 in | Wt 136.0 lb

## 2021-02-15 DIAGNOSIS — F322 Major depressive disorder, single episode, severe without psychotic features: Secondary | ICD-10-CM

## 2021-02-15 DIAGNOSIS — N951 Menopausal and female climacteric states: Secondary | ICD-10-CM

## 2021-02-15 DIAGNOSIS — Z7689 Persons encountering health services in other specified circumstances: Secondary | ICD-10-CM

## 2021-02-15 DIAGNOSIS — F1210 Cannabis abuse, uncomplicated: Secondary | ICD-10-CM

## 2021-02-15 DIAGNOSIS — F1020 Alcohol dependence, uncomplicated: Secondary | ICD-10-CM

## 2021-02-15 DIAGNOSIS — E063 Autoimmune thyroiditis: Secondary | ICD-10-CM

## 2021-02-15 DIAGNOSIS — E038 Other specified hypothyroidism: Secondary | ICD-10-CM

## 2021-02-15 NOTE — Progress Notes (Signed)
New Patient Office Visit  Subjective:  Patient ID: Victoria Travis, female    DOB: September 18, 1966  Age: 55 y.o. MRN: [id number]  CC:  Chief Complaint  Patient presents with  . Establish Care    HPI Victoria Travis presents to establish care.  Hypothyroidism- diagnosed about 10 years ago. Has been treated with levothyroxine since then. Had her TSH checked in 06/2020 with results of 21. Her levothyroxine dose was increased to and she was to have it rechecked in 6-8 weeks. Unfortunately her practice closed and she has not followed up. She saw UC recently who provided a 30 days supply of her medication to get her through to this appointment.   Menopausal symptoms- still having menstrual cycles but has been experiencing mood swings, hot flashes, and night sweats.   Mood- has a history of anxiety and depression. Not taking any medications for this. Reports drinking alcoholic drinks and smoking marijuana to help her nerves. Symptoms are sporadic and depend on life situations. Recently, has had a lot of changes including a death in the family, quitting her job, etc.    Alcohol- drinks 2+ alcoholic drinks per night on average. Feels that her drinking is not an issue. Can go several days without having alcohol.   Past Medical History:  Diagnosis Date  . Alcohol dependence (HCC)   . Depression   . Hypothyroidism 06/11/2012  . Hypothyroidism 06/11/2012  . IBS (irritable bowel syndrome)   . Thyroid disease     History reviewed. No pertinent surgical history.  Family History  Problem Relation Age of Onset  . Heart attack Father        Triple Bypass with stents    Social History   Socioeconomic History  . Marital status: Married    Spouse name: Not on file  . Number of children: 1  . Years of education: Not on file  . Highest education level: Not on file  Occupational History  . Not on file  Tobacco Use  . Smoking status: Current Every Day Smoker    Packs/day: 0.50    Types: Cigarettes     Start date: 10/10/2019  . Smokeless tobacco: Never Used  Vaping Use  . Vaping Use: Never used  Substance and Sexual Activity  . Alcohol use: Yes    Alcohol/week: 14.0 standard drinks    Types: 14 Shots of liquor per week    Comment: 1/2 gallon vodka every 9 days - drinks daily - 4 large drinks  . Drug use: Yes    Frequency: 1.0 times per week    Types: Marijuana  . Sexual activity: Not Currently    Partners: Male  Other Topics Concern  . Not on file  Social History Narrative  . Not on file   Social Determinants of Health   Financial Resource Strain: Not on file  Food Insecurity: Not on file  Transportation Needs: Not on file  Physical Activity: Not on file  Stress: Not on file  Social Connections: Not on file  Intimate Partner Violence: Not on file    ROS Review of Systems  Constitutional: Positive for diaphoresis (night sweats) and fatigue. Negative for chills, fever and unexpected weight change.  Respiratory: Negative for cough, chest tightness, shortness of breath and wheezing.   Cardiovascular: Negative for chest pain, palpitations and leg swelling.  Endocrine: Positive for heat intolerance (hot flashes). Negative for cold intolerance.  Neurological: Negative for dizziness, light-headedness and headaches.  Psychiatric/Behavioral: Positive for dysphoric mood and sleep disturbance (night  sweats). Negative for self-injury and suicidal ideas. The patient is nervous/anxious.    Objective:   Today's Vitals: BP (!) 137/99 (BP Location: Left Arm, Patient Position: Sitting, Cuff Size: Small)   Pulse (!) 103   Temp (!) 97.5 F (36.4 C)   Ht 5\' 6"  (1.676 m)   Wt 136 lb (61.7 kg)   LMP 02/15/2021 (Exact Date)   SpO2 100%   BMI 21.95 kg/m   Physical Exam Vitals reviewed.  Constitutional:      General: She is not in acute distress.    Appearance: Normal appearance.  HENT:     Head: Normocephalic and atraumatic.  Cardiovascular:     Rate and Rhythm: Normal rate and  regular rhythm.     Pulses: Normal pulses.     Heart sounds: Normal heart sounds. No murmur heard. No friction rub. No gallop.   Pulmonary:     Effort: Pulmonary effort is normal. No respiratory distress.     Breath sounds: Normal breath sounds. No wheezing.  Skin:    General: Skin is warm and dry.  Neurological:     Mental Status: She is alert and oriented to person, place, and time.  Psychiatric:        Mood and Affect: Mood normal.        Behavior: Behavior normal.        Thought Content: Thought content normal.        Judgment: Judgment normal.     Assessment & Plan:   1. Encounter to establish care Reviewed available information and discussed care concerns. She is currently self pay so limiting care to only necessary care for now. Once insurance is available, will tackle preventative care.   2. Hypothyroidism due to Hashimoto's thyroiditis Checking TSH. Once resulted, will determine levothyroxine dose and follow up.  - TSH  3. Uncomplicated alcohol dependence (HCC) Reviewed recommended alcohol intake limits for women. Recommend reducing to no more than 1 drink per day. Discussed risks of continued alcohol dependence.   4. Cannabis abuse Recommend addressing mood concerns to be able to stop or limit use of cannabis.   5. Menopausal syndrome Discussed options for management. Does not want to start medications today.   6. MDD (major depressive disorder), severe (HCC) Discussed options for management. Does not want to start medications today.   Outpatient Encounter Medications as of 02/15/2021  Medication Sig  . levothyroxine (EUTHYROX) 125 MCG tablet Take 1 tablet (125 mcg total) by mouth daily before breakfast.   No facility-administered encounter medications on file as of 02/15/2021.   Follow-up: Return if symptoms worsen or fail to improve.   04/17/2021, DNP, APRN, FNP-BC [facility]

## 2021-02-16 ENCOUNTER — Other Ambulatory Visit: Payer: Self-pay | Admitting: Medical-Surgical

## 2021-02-16 DIAGNOSIS — E038 Other specified hypothyroidism: Secondary | ICD-10-CM

## 2021-02-16 LAB — TSH: TSH: 1.02 mIU/L

## 2021-02-16 MED ORDER — LEVOTHYROXINE SODIUM 125 MCG PO TABS: 125.0000 ug | ORAL_TABLET | Freq: Every day | ORAL | 0 refills | Status: DC

## 2021-06-22 ENCOUNTER — Other Ambulatory Visit: Payer: Self-pay

## 2021-06-22 ENCOUNTER — Emergency Department
Admission: EM | Admit: 2021-06-22 | Discharge: 2021-06-22 | Disposition: A | Discharge status: Home / Self Care | Payer: Self-pay | Source: Home / Self Care

## 2021-06-22 DIAGNOSIS — R21 Rash and other nonspecific skin eruption: Secondary | ICD-10-CM

## 2021-06-22 DIAGNOSIS — L2082 Flexural eczema: Secondary | ICD-10-CM

## 2021-06-22 MED ORDER — METHYLPREDNISOLONE SODIUM SUCC 125 MG IJ SOLR
125.0000 mg | Freq: Once | INTRAMUSCULAR | Status: AC
  Administered 2021-06-22: 125 mg via INTRAMUSCULAR

## 2021-06-22 MED ORDER — METHYLPREDNISOLONE 4 MG PO TBPK
ORAL_TABLET | ORAL | 0 refills | Status: DC
Start: 2021-06-22 — End: 2022-03-16

## 2021-06-22 MED ORDER — FLUOCINONIDE 0.05 % EX OINT: TOPICAL_OINTMENT | CUTANEOUS | 0 refills | Status: DC

## 2021-06-22 NOTE — ED Triage Notes (Signed)
Pt presents with rash on back and neck that began 1 week ago. Pt endorses also having runny nose and sneezing x1 week. Pt states rash is itchy

## 2021-06-22 NOTE — Discharge Instructions (Addendum)
Advised/instructed patient to start Medrol Dosepak tomorrow morning, Thursday, 06/23/2021.  Advised patient to use Lidex topical ointment as directed for the next 7 to 10 days, advised patient if symptoms worsen and/or unresolved please follow-up with PCP for further evaluation and possible Dermatology consult.

## 2021-06-22 NOTE — ED Provider Notes (Signed)
[facility]    CSN: [id number] Arrival date & time: 06/22/21  1219      History   Chief Complaint Chief Complaint  Patient presents with   Rash    HPI Victoria Travis is a 55 y.o. female.   HPI 55 year old female presents with rash of posterior neck and upper back for 1 week.  Patient denies change of new bath soaps, laundry soaps, new emollients, creams, or ointments.  Patient denies recent contact with new pets, recent travel, or exposure to contaminated foods or beverages.  Past Medical History:  Diagnosis Date   Alcohol dependence (HCC)    Depression    Hypothyroidism 06/11/2012   Hypothyroidism 06/11/2012   IBS (irritable bowel syndrome)    Thyroid disease     Patient Active Problem List   Diagnosis Date Noted   Cannabis abuse 02/15/2021   Menopausal syndrome 02/15/2021   Hypothyroidism due to Hashimoto's thyroiditis 06/17/2020   Alcohol dependence (HCC) 03/07/2018   MDD (major depressive disorder), severe (HCC) 03/06/2018   Cellulitis of foot 06/11/2012    History reviewed. No pertinent surgical history.  OB History   No obstetric history on file.      Home Medications    Prior to Admission medications   Medication Sig Start Date End Date Taking? Authorizing Provider  fluocinonide ointment (LIDEX) 0.05 % Apply topically to affected areas of back twice daily for the next 7-10 days, then PRN. 06/22/21  Yes Trevor Iha, FNP  methylPREDNISolone (MEDROL DOSEPAK) 4 MG TBPK tablet Take as directed 06/22/21  Yes Trevor Iha, FNP  levothyroxine (EUTHYROX) 125 MCG tablet Take 1 tablet (125 mcg total) by mouth daily before breakfast. 02/16/21   Christen Butter, NP    Family History Family History  Problem Relation Age of Onset   Heart attack Father        Triple Bypass with stents    Social History Social History   Tobacco Use   Smoking status: Every Day    Packs/day: 0.50    Types: Cigarettes    Start date: 10/10/2019   Smokeless tobacco: Never   Vaping Use   Vaping Use: Never used  Substance Use Topics   Alcohol use: Yes    Alcohol/week: 14.0 standard drinks    Types: 14 Shots of liquor per week    Comment: 1/2 gallon vodka every 9 days - drinks daily - 4 large drinks   Drug use: Yes    Frequency: 1.0 times per week    Types: Marijuana     Allergies   Patient has no known allergies.   Review of Systems Review of Systems  Skin:  Positive for rash.  All other systems reviewed and are negative.   Physical Exam Triage Vital Signs ED Triage Vitals  Enc Vitals Group     BP 06/22/21 1228 (!) 147/108     Pulse Rate 06/22/21 1228 85     Resp 06/22/21 1228 14     Temp 06/22/21 1228 98.2 F (36.8 C)     Temp Source 06/22/21 1228 Oral     SpO2 06/22/21 1228 98 %     Weight --      Height --      Head Circumference --      Peak Flow --      Pain Score 06/22/21 1229 0     Pain Loc --      Pain Edu? --      Excl. in GC? --  No data found.  Updated Vital Signs BP (!) 147/108 (BP Location: Left Arm)   Pulse 85   Temp 98.2 F (36.8 C) (Oral)   Resp 14   SpO2 98%    Physical Exam Vitals and nursing note reviewed.  Constitutional:      Appearance: Normal appearance.  HENT:     Head: Normocephalic and atraumatic.     Mouth/Throat:     Mouth: Mucous membranes are moist.     Pharynx: Oropharynx is clear.  Eyes:     Extraocular Movements: Extraocular movements intact.     Conjunctiva/sclera: Conjunctivae normal.     Pupils: Pupils are equal, round, and reactive to light.  Cardiovascular:     Rate and Rhythm: Normal rate and regular rhythm.     Pulses: Normal pulses.     Heart sounds: Normal heart sounds.  Pulmonary:     Effort: Pulmonary effort is normal.     Breath sounds: Normal breath sounds.     Comments: No adventitious breath sounds noted Musculoskeletal:        General: Normal range of motion.     Cervical back: Normal range of motion and neck supple.  Skin:    General: Skin is warm and dry.      Comments: Posterior neck (inferior aspect), upper back (superior central aspect): Pruritic erythematous maculopapular eruption, with raised pruritic erythematous plaques, with coalesced scales noted.  Please see image inserted  Neurological:     General: No focal deficit present.     Mental Status: She is alert and oriented to person, place, and time. Mental status is at baseline.     Motor: Weakness present.  Psychiatric:        Mood and Affect: Mood normal.        Behavior: Behavior normal.        Thought Content: Thought content normal.       UC Treatments / Results  Labs (all labs ordered are listed, but only abnormal results are displayed) Labs Reviewed - No data to display  EKG   Radiology No results found.  Procedures Procedures (including critical care time)  Medications Ordered in UC Medications  methylPREDNISolone sodium succinate (SOLU-MEDROL) 125 mg/2 mL injection 125 mg (125 mg Intramuscular Given 06/22/21 1254)    Initial Impression / Assessment and Plan / UC Course  I have reviewed the triage vital signs and the nursing notes.  Pertinent labs & imaging results that were available during my care of the patient were reviewed by me and considered in my medical decision making (see chart for details).     MDM: 1.  Rash and nonspecific skin eruption-IM Solu-Medrol given once in clinic prior to discharge today, Rx'd Medrol Dosepak to start tomorrow; 2.  Flexural eczema-Rx'd Lidex. Advised/instructed patient to start Medrol Dosepak tomorrow morning, Thursday, 06/23/2021.  Advised patient to use Lidex topical ointment as directed for the next 7 to 10 days, advised patient if symptoms worsen and/or unresolved please follow-up with PCP for further evaluation and possible Dermatology consult.  Discharged home, hemodynamically stable. Final Clinical Impressions(s) / UC Diagnoses   Final diagnoses:  Rash and nonspecific skin eruption  Flexural eczema      Discharge Instructions      Advised/instructed patient to start Medrol Dosepak tomorrow morning, Thursday, 06/23/2021.  Advised patient to use Lidex topical ointment as directed for the next 7 to 10 days, advised patient if symptoms worsen and/or unresolved please follow-up with PCP for further evaluation and possible  Dermatology consult.     ED Prescriptions     Medication Sig Dispense Auth. Provider   methylPREDNISolone (MEDROL DOSEPAK) 4 MG TBPK tablet Take as directed 1 each Trevor Iha, FNP   fluocinonide ointment (LIDEX) 0.05 % Apply topically to affected areas of back twice daily for the next 7-10 days, then PRN. 30 g Trevor Iha, FNP      PDMP not reviewed this encounter.   Trevor Iha, FNP 06/22/21 1321

## 2021-07-07 ENCOUNTER — Other Ambulatory Visit: Payer: Self-pay | Admitting: Medical-Surgical

## 2021-07-07 ENCOUNTER — Other Ambulatory Visit: Payer: Self-pay

## 2021-07-07 DIAGNOSIS — E063 Autoimmune thyroiditis: Secondary | ICD-10-CM

## 2021-07-07 DIAGNOSIS — E038 Other specified hypothyroidism: Secondary | ICD-10-CM

## 2021-08-10 ENCOUNTER — Telehealth: Payer: Self-pay | Admitting: Medical-Surgical

## 2021-08-10 NOTE — Telephone Encounter (Signed)
Pt only has two pills left of her synthroid, does she need to make an appointment with Christen Butter or just come for labwork? Please advise pt and call her. She also request a refill. Thank you

## 2021-08-10 NOTE — Telephone Encounter (Signed)
Attempted to call pt, no answer. LVM with information regarding lab draw.

## 2021-08-13 ENCOUNTER — Other Ambulatory Visit: Payer: Self-pay | Admitting: Medical-Surgical

## 2021-08-13 DIAGNOSIS — E038 Other specified hypothyroidism: Secondary | ICD-10-CM

## 2021-08-13 LAB — TSH: TSH: 0.03 mIU/L — ABNORMAL LOW

## 2021-08-15 ENCOUNTER — Other Ambulatory Visit: Payer: Self-pay | Admitting: Medical-Surgical

## 2021-08-15 DIAGNOSIS — E063 Autoimmune thyroiditis: Secondary | ICD-10-CM

## 2021-08-15 DIAGNOSIS — E038 Other specified hypothyroidism: Secondary | ICD-10-CM

## 2021-08-15 NOTE — Telephone Encounter (Signed)
Pt recently had labs done

## 2021-12-03 ENCOUNTER — Other Ambulatory Visit: Payer: Self-pay | Admitting: Medical-Surgical

## 2021-12-03 DIAGNOSIS — E063 Autoimmune thyroiditis: Secondary | ICD-10-CM

## 2021-12-03 DIAGNOSIS — E038 Other specified hypothyroidism: Secondary | ICD-10-CM

## 2022-01-05 ENCOUNTER — Telehealth: Payer: Self-pay | Admitting: Medical-Surgical

## 2022-01-05 NOTE — Telephone Encounter (Signed)
Edit:  ? ?Patient changed appt to 02/16/2022 but still needs a refill up until her appt date. AM ?

## 2022-01-05 NOTE — Telephone Encounter (Signed)
Patient stopped by to schedule a f/u appt with you, scheduled for next available on 02/15/2022. Patient needs a refill of med listed below refilled up until appt date. AM ? ? ?levothyroxine (SYNTHROID) 112 MCG tablet ? ? ?[facility] [address], [place] - [address] Phone:  [phone]  ?Fax:  [phone]  ?  ? ? ? ?

## 2022-01-06 NOTE — Telephone Encounter (Signed)
LVM letting patient know to swing by office to have blood work done/keep follow up appt. AM ?

## 2022-01-09 ENCOUNTER — Other Ambulatory Visit: Payer: Self-pay | Admitting: Medical-Surgical

## 2022-01-09 DIAGNOSIS — E038 Other specified hypothyroidism: Secondary | ICD-10-CM

## 2022-01-11 ENCOUNTER — Other Ambulatory Visit: Payer: Self-pay

## 2022-01-11 DIAGNOSIS — E038 Other specified hypothyroidism: Secondary | ICD-10-CM

## 2022-01-11 MED ORDER — LEVOTHYROXINE SODIUM 112 MCG PO TABS: ORAL_TABLET | ORAL | 0 refills | Status: DC

## 2022-01-11 NOTE — Progress Notes (Signed)
Please contact the patient to schedule thyroid follow-up appt with [place]. Labs required also.  ? ?Thanks ? ?

## 2022-01-11 NOTE — Progress Notes (Signed)
Patient is scheduled for 02/16/2022. ?

## 2022-01-12 ENCOUNTER — Other Ambulatory Visit: Payer: Self-pay

## 2022-01-12 DIAGNOSIS — E063 Autoimmune thyroiditis: Secondary | ICD-10-CM

## 2022-01-12 MED ORDER — LEVOTHYROXINE SODIUM 112 MCG PO TABS: ORAL_TABLET | ORAL | 0 refills | Status: DC

## 2022-02-15 ENCOUNTER — Ambulatory Visit: Payer: Self-pay | Admitting: Medical-Surgical

## 2022-02-16 ENCOUNTER — Ambulatory Visit: Payer: Self-pay | Admitting: Medical-Surgical

## 2022-02-18 ENCOUNTER — Emergency Department (HOSPITAL_BASED_OUTPATIENT_CLINIC_OR_DEPARTMENT_OTHER): Payer: Self-pay

## 2022-02-18 ENCOUNTER — Emergency Department (HOSPITAL_BASED_OUTPATIENT_CLINIC_OR_DEPARTMENT_OTHER)
Admission: EM | Admit: 2022-02-18 | Discharge: 2022-02-18 | Disposition: A | Discharge status: Home / Self Care | Payer: Self-pay | Attending: Emergency Medicine | Admitting: Emergency Medicine

## 2022-02-18 ENCOUNTER — Encounter (HOSPITAL_BASED_OUTPATIENT_CLINIC_OR_DEPARTMENT_OTHER): Payer: Self-pay | Admitting: Emergency Medicine

## 2022-02-18 ENCOUNTER — Other Ambulatory Visit: Payer: Self-pay

## 2022-02-18 DIAGNOSIS — E039 Hypothyroidism, unspecified: Secondary | ICD-10-CM | POA: Insufficient documentation

## 2022-02-18 DIAGNOSIS — R0602 Shortness of breath: Secondary | ICD-10-CM | POA: Insufficient documentation

## 2022-02-18 DIAGNOSIS — R0989 Other specified symptoms and signs involving the circulatory and respiratory systems: Secondary | ICD-10-CM | POA: Insufficient documentation

## 2022-02-18 DIAGNOSIS — J069 Acute upper respiratory infection, unspecified: Secondary | ICD-10-CM

## 2022-02-18 DIAGNOSIS — R0981 Nasal congestion: Secondary | ICD-10-CM | POA: Insufficient documentation

## 2022-02-18 DIAGNOSIS — Z79899 Other long term (current) drug therapy: Secondary | ICD-10-CM | POA: Insufficient documentation

## 2022-02-18 DIAGNOSIS — R000 Tachycardia, unspecified: Secondary | ICD-10-CM | POA: Insufficient documentation

## 2022-02-18 DIAGNOSIS — R059 Cough, unspecified: Secondary | ICD-10-CM | POA: Insufficient documentation

## 2022-02-18 LAB — CBC WITH DIFFERENTIAL/PLATELET
Abs Immature Granulocytes: 0.01 10*3/uL (ref 0.00–0.07)
Basophils Absolute: 0 10*3/uL (ref 0.0–0.1)
Basophils Relative: 1 %
Eosinophils Absolute: 0.2 10*3/uL (ref 0.0–0.5)
Eosinophils Relative: 4 %
HCT: 38 % (ref 36.0–46.0)
Hemoglobin: 12.8 g/dL (ref 12.0–15.0)
Immature Granulocytes: 0 %
Lymphocytes Relative: 41 %
Lymphs Abs: 1.5 10*3/uL (ref 0.7–4.0)
MCH: 30.9 pg (ref 26.0–34.0)
MCHC: 33.7 g/dL (ref 30.0–36.0)
MCV: 91.8 fL (ref 80.0–100.0)
Monocytes Absolute: 0.4 10*3/uL (ref 0.1–1.0)
Monocytes Relative: 12 %
Neutro Abs: 1.6 10*3/uL — ABNORMAL LOW (ref 1.7–7.7)
Neutrophils Relative %: 42 %
Platelets: 174 10*3/uL (ref 150–400)
RBC: 4.14 MIL/uL (ref 3.87–5.11)
RDW: 17.7 % — ABNORMAL HIGH (ref 11.5–15.5)
WBC: 3.7 10*3/uL — ABNORMAL LOW (ref 4.0–10.5)
nRBC: 0 % (ref 0.0–0.2)

## 2022-02-18 LAB — BASIC METABOLIC PANEL
Anion gap: 11 (ref 5–15)
BUN: 7 mg/dL (ref 6–20)
CO2: 22 mmol/L (ref 22–32)
Calcium: 8.2 mg/dL — ABNORMAL LOW (ref 8.9–10.3)
Chloride: 105 mmol/L (ref 98–111)
Creatinine, Ser: 0.53 mg/dL (ref 0.44–1.00)
GFR, Estimated: >60 mL/min (ref >60)
Glucose, Bld: 104 mg/dL — ABNORMAL HIGH (ref 70–99)
Potassium: 3 mmol/L — ABNORMAL LOW (ref 3.5–5.1)
Sodium: 138 mmol/L (ref 135–145)

## 2022-02-18 LAB — TROPONIN I (HIGH SENSITIVITY): Troponin I (High Sensitivity): <2 ng/L

## 2022-02-18 LAB — D-DIMER, QUANTITATIVE: D-Dimer, Quant: 0.68 ug{FEU}/mL — ABNORMAL HIGH (ref 0.00–0.50)

## 2022-02-18 MED ORDER — IOHEXOL 350 MG/ML SOLN
75.0000 mL | Freq: Once | INTRAVENOUS | Status: AC | PRN
  Administered 2022-02-18: 100 mL via INTRAVENOUS

## 2022-02-18 MED ORDER — HYDROCOD POLI-CHLORPHE POLI ER 10-8 MG/5ML PO SUER
5.0000 mL | Freq: Once | ORAL | Status: AC
  Administered 2022-02-18: 5 mL via ORAL
  Filled 2022-02-18: qty 5

## 2022-02-18 MED ORDER — BENZONATATE 100 MG PO CAPS: 100.0000 mg | ORAL_CAPSULE | Freq: Three times a day (TID) | ORAL | 0 refills | Status: DC | PRN

## 2022-02-18 MED ORDER — ALBUTEROL SULFATE HFA 108 (90 BASE) MCG/ACT IN AERS
2.0000 | INHALATION_SPRAY | Freq: Once | RESPIRATORY_TRACT | Status: AC
  Administered 2022-02-18: 2 via RESPIRATORY_TRACT
  Filled 2022-02-18: qty 6.7

## 2022-02-18 NOTE — ED Provider Notes (Signed)
?[facility] EMERGENCY DEPARTMENT ?Provider Note ? ? ?CSN: [id number] ?Arrival date & time: 02/18/22  1947 ? ?  ? ?History ? ?Chief Complaint  ?Patient presents with  ? Shortness of Breath  ? ? ?Victoria Travis is a 56 y.o. female.  Presents to ER due to concern for cough, shortness of breath.  Patient states that over the past week she has had progressive cough, has had some production, mostly clear.  Has some associated chest and nasal congestion.  States that over the last couple days she has had increasing shortness of breath.  No chest pain.  She does endorse history of smoking.  No known heart issues.  Denies prior history of DVT/PE.  Does endorse history of low thyroid.  States she has follow-up with her primary care doctor for monitoring of her thyroid. ? ?HPI ? ?  ? ?Home Medications ?Prior to Admission medications   ?Medication Sig Start Date End Date Taking? Authorizing Provider  ?fluocinonide ointment (LIDEX) 0.05 % Apply topically to affected areas of back twice daily for the next 7-10 days, then PRN. 06/22/21   Trevor Iha, FNP  ?levothyroxine (SYNTHROID) 112 MCG tablet TAKE 1 TABLET BY MOUTH ONCE DAILY . NEED LABS IN 6 WEEKS 01/12/22   Christen Butter, NP  ?methylPREDNISolone (MEDROL DOSEPAK) 4 MG TBPK tablet Take as directed 06/22/21   Trevor Iha, FNP  ?   ? ?Allergies    ?Patient has no known allergies.   ? ?Review of Systems   ?Review of Systems  ?Constitutional:  Positive for chills and fatigue. Negative for fever.  ?HENT:  Negative for ear pain and sore throat.   ?Eyes:  Negative for pain and visual disturbance.  ?Respiratory:  Positive for cough and shortness of breath.   ?Cardiovascular:  Negative for chest pain and palpitations.  ?Gastrointestinal:  Negative for abdominal pain and vomiting.  ?Genitourinary:  Negative for dysuria and hematuria.  ?Musculoskeletal:  Negative for arthralgias and back pain.  ?Skin:  Negative for color change and rash.  ?Neurological:  Negative for seizures and  syncope.  ?All other systems reviewed and are negative. ? ?Physical Exam ?Updated Vital Signs ?BP (!) 154/120 (BP Location: Right Arm)   Pulse (!) 114   Temp 98.5 ?F (36.9 ?C) (Oral)   Resp 20   Ht 5\' 5"  (1.651 m)   Wt 61.7 kg   SpO2 (!) 1%   BMI 22.63 kg/m?  ?Physical Exam ?Vitals and nursing note reviewed.  ?Constitutional:   ?   General: She is not in acute distress. ?   Appearance: She is well-developed.  ?HENT:  ?   Head: Normocephalic and atraumatic.  ?Eyes:  ?   Conjunctiva/sclera: Conjunctivae normal.  ?Cardiovascular:  ?   Rate and Rhythm: Normal rate and regular rhythm.  ?   Heart sounds: No murmur heard. ?Pulmonary:  ?   Effort: Pulmonary effort is normal. No respiratory distress.  ?   Breath sounds: Normal breath sounds.  ?Abdominal:  ?   Palpations: Abdomen is soft.  ?   Tenderness: There is no abdominal tenderness.  ?Musculoskeletal:     ?   General: No swelling.  ?   Cervical back: Neck supple.  ?Skin: ?   General: Skin is warm and dry.  ?   Capillary Refill: Capillary refill takes less than 2 seconds.  ?Neurological:  ?   Mental Status: She is alert.  ?Psychiatric:     ?   Mood and Affect: Mood normal.  ? ? ?  ED Results / Procedures / Treatments   ?Labs ?(all labs ordered are listed, but only abnormal results are displayed) ?Labs Reviewed  ?CBC WITH DIFFERENTIAL/PLATELET  ?BASIC METABOLIC PANEL  ?D-DIMER, QUANTITATIVE  ?TROPONIN I (HIGH SENSITIVITY)  ? ? ?EKG ?EKG Interpretation ? ?Date/Time:  Saturday Feb 18 2022 19:54:09 EDT ?Ventricular Rate:  112 ?PR Interval:  134 ?QRS Duration: 70 ?QT Interval:  322 ?QTC Calculation: 439 ?R Axis:   53 ?Text Interpretation: Sinus tachycardia Otherwise normal ECG When compared with ECG of 29-Dec-2019 10:52, PREVIOUS ECG IS PRESENT Confirmed by Marianna Fuss ([id number]) on 02/18/2022 8:44:13 PM ? ?Radiology ?DG Chest 2 View ? ?Result Date: 02/18/2022 ?CLINICAL DATA:  Cough and shortness of breath. EXAM: CHEST - 2 VIEW COMPARISON:  December 29, 2019 FINDINGS: The  heart size and mediastinal contours are within normal limits. Both lungs are clear. The visualized skeletal structures are unremarkable. IMPRESSION: No active cardiopulmonary disease. Electronically Signed   By: Aram Candela M.D.   On: 02/18/2022 20:24   ? ?Procedures ?Procedures  ? ? ?Medications Ordered in ED ?Medications  ?chlorpheniramine-HYDROcodone 10-8 MG/5ML suspension 5 mL (has no administration in time range)  ?albuterol (VENTOLIN HFA) 108 (90 Base) MCG/ACT inhaler 2 puff (2 puffs Inhalation Given 02/18/22 2055)  ? ? ?ED Course/ Medical Decision Making/ A&P ?  ?                        ?Medical Decision Making ?Amount and/or Complexity of Data Reviewed ?Labs: ordered. ?Radiology: ordered. ? ?Risk ?Prescription drug management. ? ? ?56 year old lady presents to ER for cough and shortness of breath.  On exam she appears well in no distress, she was mildly tachycardic in triage but now has normal heart rate.  Lungs are clear.  CXR was independently reviewed by myself, no acute findings noted.  No pneumonia.  Due to her shortness of breath and the initial tachycardia, will check some basic labs including troponin, D-dimer and EKG.  EKG without ischemic change, troponin within normal limits, doubt ACS.  D-dimer is ? ? ? ? ? ? ? ?Final Clinical Impression(s) / ED Diagnoses ?Final diagnoses:  ?None  ? ? ?Rx / DC Orders ?ED Discharge Orders   ? ? None  ? ?  ? ? ?  ?Milagros Loll, MD ?02/18/22 2117 ? ?

## 2022-02-18 NOTE — ED Triage Notes (Signed)
Reports she has been fighting a cold for about a week.  Now having cough with sob with chest and head congestion.  Hx of bronchitis when she had covid. ?

## 2022-02-18 NOTE — Discharge Instructions (Addendum)
Follow-up with primary care doctor.  Come back to ER for worsening difficulty breathing or other new concerning symptom. ?

## 2022-02-20 ENCOUNTER — Telehealth: Payer: Self-pay | Admitting: General Practice

## 2022-02-20 NOTE — Telephone Encounter (Signed)
Transition Care Management Unsuccessful Follow-up Telephone Call ? ?Date of discharge and from where:  02/18/22 from [facility] ? ?Attempts:  1st Attempt ? ?Reason for unsuccessful TCM follow-up call:  Left voice message ? ?  ?

## 2022-02-21 NOTE — Telephone Encounter (Signed)
Transition Care Management Unsuccessful Follow-up Telephone Call ? ?Date of discharge and from where:  02/18/22 from [facility] ? ?Attempts:  2nd Attempt ? ?Reason for unsuccessful TCM follow-up call:  Left voice message patient has an OV scheduled for 02/23/22. ? ?  ?

## 2022-02-22 NOTE — Telephone Encounter (Signed)
Transition Care Management Unsuccessful Follow-up Telephone Call ? ?Date of discharge and from where:  02/18/22 from [facility] ? ?Attempts:  3rd Attempt ? ?Reason for unsuccessful TCM follow-up call:  Left voice message ? ?  ?

## 2022-02-23 ENCOUNTER — Ambulatory Visit (INDEPENDENT_AMBULATORY_CARE_PROVIDER_SITE_OTHER): Payer: Self-pay | Admitting: Medical-Surgical

## 2022-02-23 DIAGNOSIS — Z91199 Patient's noncompliance with other medical treatment and regimen due to unspecified reason: Secondary | ICD-10-CM

## 2022-02-23 NOTE — Progress Notes (Signed)
   Complete physical exam  Patient: Victoria Travis   DOB: 07/29/1998   56 y.o. Female  MRN: [id number]  Subjective:    No chief complaint on file.   Victoria Travis is a 56 y.o. female who presents today for a complete physical exam. She reports consuming a {diet types:17450} diet. {types:19826} She generally feels {DESC; WELL/FAIRLY WELL/POORLY:18703}. She reports sleeping {DESC; WELL/FAIRLY WELL/POORLY:18703}. She {does/does not:200015} have additional problems to discuss today.    Most recent fall risk assessment:    04/05/2022   10:42 AM  Fall Risk   Falls in the past year? 0  Number falls in past yr: 0  Injury with Fall? 0  Risk for fall due to : No Fall Risks  Follow up Falls evaluation completed     Most recent depression screenings:    04/05/2022   10:42 AM 02/24/2021   10:46 AM  PHQ 2/9 Scores  PHQ - 2 Score 0 0  PHQ- 9 Score 5     {VISON DENTAL STD PSA (Optional):27386}  {History (Optional):23778}  Patient Care Team: Doneisha Ivey, NP as PCP - General (Nurse Practitioner)   Outpatient Medications Prior to Visit  Medication Sig   fluticasone (FLONASE) 50 MCG/ACT nasal spray Place 2 sprays into both nostrils in the morning and at bedtime. After 7 days, reduce to once daily.   norgestimate-ethinyl estradiol (SPRINTEC 28) 0.25-35 MG-MCG tablet Take 1 tablet by mouth daily.   Nystatin POWD Apply liberally to affected area 2 times per day   spironolactone (ALDACTONE) 100 MG tablet Take 1 tablet (100 mg total) by mouth daily.   No facility-administered medications prior to visit.    ROS        Objective:     There were no vitals taken for this visit. {Vitals History (Optional):23777}  Physical Exam   No results found for any visits on 05/11/22. {Show previous labs (optional):23779}    Assessment & Plan:    Routine Health Maintenance and Physical Exam  Immunization History  Administered Date(s) Administered   DTaP 10/12/1999, 12/08/1999,  02/16/2000, 11/01/2000, 05/17/2004   Hepatitis A 03/13/2008, 03/19/2009   Hepatitis B 07/30/1999, 09/06/1999, 02/16/2000   HiB (PRP-OMP) 10/12/1999, 12/08/1999, 02/16/2000, 11/01/2000   IPV 10/12/1999, 12/08/1999, 08/06/2000, 05/17/2004   Influenza,inj,Quad PF,6+ Mos 06/19/2014   Influenza-Unspecified 09/18/2012   MMR 08/06/2001, 05/17/2004   Meningococcal Polysaccharide 03/18/2012   Pneumococcal Conjugate-13 11/01/2000   Pneumococcal-Unspecified 02/16/2000, 05/01/2000   Tdap 03/18/2012   Varicella 08/06/2000, 03/13/2008    Health Maintenance  Topic Date Due   HIV Screening  Never done   Hepatitis C Screening  Never done   INFLUENZA VACCINE  05/09/2022   PAP-Cervical Cytology Screening  05/11/2022 (Originally 07/28/2020)   PAP SMEAR-Modifier  05/11/2022 (Originally 07/28/2020)   TETANUS/TDAP  05/11/2022 (Originally 03/18/2022)   HPV VACCINES  Discontinued   COVID-19 Vaccine  Discontinued    Discussed health benefits of physical activity, and encouraged her to engage in regular exercise appropriate for her age and condition.  Problem List Items Addressed This Visit   None Visit Diagnoses     Annual physical exam    -  Primary   Cervical cancer screening       Need for Tdap vaccination          No follow-ups on file.     Jessee Mezera, NP   

## 2022-02-24 ENCOUNTER — Other Ambulatory Visit: Payer: Self-pay | Admitting: Medical-Surgical

## 2022-02-24 ENCOUNTER — Telehealth: Payer: Self-pay | Admitting: Medical-Surgical

## 2022-02-24 ENCOUNTER — Emergency Department (HOSPITAL_BASED_OUTPATIENT_CLINIC_OR_DEPARTMENT_OTHER)
Admission: EM | Admit: 2022-02-24 | Discharge: 2022-02-24 | Disposition: A | Discharge status: Home / Self Care | Payer: Self-pay | Attending: Emergency Medicine | Admitting: Emergency Medicine

## 2022-02-24 ENCOUNTER — Other Ambulatory Visit: Payer: Self-pay

## 2022-02-24 ENCOUNTER — Encounter (HOSPITAL_BASED_OUTPATIENT_CLINIC_OR_DEPARTMENT_OTHER): Payer: Self-pay | Admitting: Emergency Medicine

## 2022-02-24 DIAGNOSIS — E038 Other specified hypothyroidism: Secondary | ICD-10-CM

## 2022-02-24 DIAGNOSIS — F17200 Nicotine dependence, unspecified, uncomplicated: Secondary | ICD-10-CM | POA: Insufficient documentation

## 2022-02-24 DIAGNOSIS — J069 Acute upper respiratory infection, unspecified: Secondary | ICD-10-CM | POA: Insufficient documentation

## 2022-02-24 DIAGNOSIS — R000 Tachycardia, unspecified: Secondary | ICD-10-CM | POA: Insufficient documentation

## 2022-02-24 MED ORDER — LEVOTHYROXINE SODIUM 112 MCG PO TABS: ORAL_TABLET | ORAL | 0 refills | Status: DC

## 2022-02-24 MED ORDER — AMOXICILLIN-POT CLAVULANATE 875-125 MG PO TABS: 1.0000 | ORAL_TABLET | Freq: Two times a day (BID) | ORAL | 0 refills | Status: DC

## 2022-02-24 MED ORDER — AMOXICILLIN-POT CLAVULANATE 875-125 MG PO TABS
1.0000 | ORAL_TABLET | Freq: Once | ORAL | Status: AC
  Administered 2022-02-24: 1 via ORAL
  Filled 2022-02-24: qty 1

## 2022-02-24 MED ORDER — DEXAMETHASONE 4 MG PO TABS
10.0000 mg | ORAL_TABLET | Freq: Once | ORAL | Status: AC
  Administered 2022-02-24: 10 mg via ORAL
  Filled 2022-02-24: qty 3

## 2022-02-24 NOTE — Discharge Instructions (Signed)
Take tylenol 2 pills 4 times a day and motrin 4 pills 3 times a day.  Drink plenty of fluids.  Return for worsening shortness of breath, headache, confusion. Follow up with your family doctor.   

## 2022-02-24 NOTE — Telephone Encounter (Signed)
Per patient missed last appt and rescheduled for 6/8 but will be out of her thyroid medication before then. Can a refill be sent to get her through until her appt.gh

## 2022-02-24 NOTE — ED Notes (Signed)
Ambulated from  lobby to room 11, HR 110-122, SpO298-100%

## 2022-02-24 NOTE — ED Provider Notes (Signed)
[facility] EMERGENCY DEPARTMENT Provider Note   CSN: [id number] Arrival date & time: 02/24/22  3474     History  Chief Complaint  Patient presents with   Cough    Victoria Travis is a 56 y.o. female.  56 yo F with chief complaints of cough congestion shortness of breath.  This has been going on for about two weeks now.  She had been seen in the emergency department and had a CT angiogram of the chest that was negative for intrathoracic pathology.  She unfortunately has continued to have symptoms.  Feels a pressure in her sinuses.  Coughing up purulent like sputum.  She feels like she needs an antibiotic to try and get this.  She is an everyday smoker.   Cough     Home Medications Prior to Admission medications   Medication Sig Start Date End Date Taking? Authorizing Provider  amoxicillin-clavulanate (AUGMENTIN) 875-125 MG tablet Take 1 tablet by mouth every 12 (twelve) hours. 02/24/22  Yes Melene Plan, DO  benzonatate (TESSALON) 100 MG capsule Take 1 capsule (100 mg total) by mouth 3 (three) times daily as needed for cough. 02/18/22   Milagros Loll, MD  fluocinonide ointment (LIDEX) 0.05 % Apply topically to affected areas of back twice daily for the next 7-10 days, then PRN. 06/22/21   Trevor Iha, FNP  levothyroxine (SYNTHROID) 112 MCG tablet TAKE 1 TABLET BY MOUTH ONCE DAILY . NEED LABS IN 6 WEEKS 01/12/22   Christen Butter, NP  methylPREDNISolone (MEDROL DOSEPAK) 4 MG TBPK tablet Take as directed 06/22/21   Trevor Iha, FNP      Allergies    Patient has no known allergies.    Review of Systems   Review of Systems  Respiratory:  Positive for cough.    Physical Exam Updated Vital Signs BP (!) 170/124 (BP Location: Left Arm)   Pulse (!) 104   Temp 97.7 F (36.5 C) (Oral)   Resp (!) 22 Comment: sobbing  Ht 5\' 5"  (1.651 m)   Wt 61.7 kg   SpO2 100%   BMI 22.64 kg/m  Physical Exam Vitals and nursing note reviewed.  Constitutional:      General: She is not  in acute distress.    Appearance: She is well-developed. She is not diaphoretic.  HENT:     Head: Normocephalic and atraumatic.     Comments: Swollen turbinates, posterior nasal drip, no noted sinus ttp, right TM with purulent appearing effusion and some bulging distortion of landmarks  Eyes:     Pupils: Pupils are equal, round, and reactive to light.  Cardiovascular:     Rate and Rhythm: Regular rhythm. Tachycardia present.     Heart sounds: No murmur heard.   No friction rub. No gallop.  Pulmonary:     Effort: Pulmonary effort is normal.     Breath sounds: No wheezing or rales.  Abdominal:     General: There is no distension.     Palpations: Abdomen is soft.     Tenderness: There is no abdominal tenderness.  Musculoskeletal:        General: No tenderness.     Cervical back: Normal range of motion and neck supple.  Skin:    General: Skin is warm and dry.  Neurological:     Mental Status: She is alert and oriented to person, place, and time.  Psychiatric:        Behavior: Behavior normal.    ED Results / Procedures / Treatments  Labs (all labs ordered are listed, but only abnormal results are displayed) Labs Reviewed - No data to display  EKG None  Radiology No results found.  Procedures Procedures    Medications Ordered in ED Medications  dexamethasone (DECADRON) tablet 10 mg (has no administration in time range)  amoxicillin-clavulanate (AUGMENTIN) 875-125 MG per tablet 1 tablet (has no administration in time range)    ED Course/ Medical Decision Making/ A&P                           Medical Decision Making Risk Prescription drug management.   56 yo F with a chief complaints of cough congestion difficulty breathing has been going on for about a week and a half or so.  She had been seen in the emergency department previously for this and had a CT angiogram of the chest after she had a positive D-dimer.  This likely was negative but unfortunately the  patient continues to have persistent symptoms.  She does feel some sinus pressure and has some signs of sinusitis clinically on exam.  With her having multiple weeks of symptoms and no improvement will start on antibiotics.  With her being an every day smoker we will give a dose of Decadron.  We will have her follow-up with her family doctor.  I did discuss obtaining lab work as the patient is tachycardic again today like she was a week ago.  Patient is declining at this time.  10:32 AM:  I have discussed the diagnosis/risks/treatment options with the patient.  Evaluation and diagnostic testing in the emergency department does not suggest an emergent condition requiring admission or immediate intervention beyond what has been performed at this time.  They will follow up with  PCP. We also discussed returning to the ED immediately if new or worsening sx occur. We discussed the sx which are most concerning (e.g., sudden worsening sob, fever, inability to tolerate by mouth) that necessitate immediate return. Medications administered to the patient during their visit and any new prescriptions provided to the patient are listed below.  Medications given during this visit Medications  dexamethasone (DECADRON) tablet 10 mg (has no administration in time range)  amoxicillin-clavulanate (AUGMENTIN) 875-125 MG per tablet 1 tablet (has no administration in time range)     The patient appears reasonably screen and/or stabilized for discharge and I doubt any other medical condition or other [facility] requiring further screening, evaluation, or treatment in the ED at this time prior to discharge.          Final Clinical Impression(s) / ED Diagnoses Final diagnoses:  Viral URI with cough    Rx / DC Orders ED Discharge Orders          Ordered    amoxicillin-clavulanate (AUGMENTIN) 875-125 MG tablet  Every 12 hours        02/24/22 1028              Ma[place], DO 02/24/22 1032

## 2022-02-24 NOTE — ED Triage Notes (Signed)
Cough x 2 weeks got dx with bronchitis and was seen but does not feel better , states needs antiobiotics

## 2022-02-27 ENCOUNTER — Telehealth: Payer: Self-pay | Admitting: General Practice

## 2022-02-27 NOTE — Telephone Encounter (Signed)
Transition Care Management Unsuccessful Follow-up Telephone Call  Date of discharge and from where:  02/24/22 from [facility]  Attempts:  1st Attempt  Reason for unsuccessful TCM follow-up call:  Left voice message

## 2022-02-28 NOTE — Telephone Encounter (Signed)
Transition Care Management Unsuccessful Follow-up Telephone Call  Date of discharge and from where:  02/24/22 from [facility]  Attempts:  2nd Attempt  Reason for unsuccessful TCM follow-up call:  Left voice message    

## 2022-03-01 NOTE — Telephone Encounter (Signed)
Transition Care Management Unsuccessful Follow-up Telephone Call  Date of discharge and from where:  02/24/22 from [facility]  Attempts:  3rd Attempt  Reason for unsuccessful TCM follow-up call:  Left voice message

## 2022-03-16 ENCOUNTER — Ambulatory Visit (INDEPENDENT_AMBULATORY_CARE_PROVIDER_SITE_OTHER): Payer: Self-pay | Admitting: Medical-Surgical

## 2022-03-16 ENCOUNTER — Encounter: Payer: Self-pay | Admitting: Medical-Surgical

## 2022-03-16 VITALS — BP 130/89 | HR 88 | Ht 65.0 in | Wt 135.0 lb

## 2022-03-16 DIAGNOSIS — E038 Other specified hypothyroidism: Secondary | ICD-10-CM

## 2022-03-16 DIAGNOSIS — E063 Autoimmune thyroiditis: Secondary | ICD-10-CM

## 2022-03-16 DIAGNOSIS — R8290 Unspecified abnormal findings in urine: Secondary | ICD-10-CM

## 2022-03-16 DIAGNOSIS — R233 Spontaneous ecchymoses: Secondary | ICD-10-CM

## 2022-03-16 LAB — WET PREP FOR TRICH, YEAST, CLUE
MICRO NUMBER:: 13500793
Specimen Quality: ADEQUATE

## 2022-03-16 LAB — POCT URINALYSIS DIP (CLINITEK)
Bilirubin, UA: NEGATIVE
Blood, UA: NEGATIVE
Glucose, UA: NEGATIVE mg/dL
Leukocytes, UA: NEGATIVE
Nitrite, UA: POSITIVE — AB
Spec Grav, UA: 1.025 (ref 1.010–1.025)
Urobilinogen, UA: 1 E.U./dL
pH, UA: 7 (ref 5.0–8.0)

## 2022-03-16 MED ORDER — NITROFURANTOIN MONOHYD MACRO 100 MG PO CAPS: 100.0000 mg | ORAL_CAPSULE | Freq: Two times a day (BID) | ORAL | 0 refills | Status: DC

## 2022-03-16 NOTE — Progress Notes (Signed)
Established Patient Office Visit  Subjective   Patient ID: Victoria Travis, female   DOB: [birth year] Age: 56 y.o. 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  She has been taking levothyroxine 112 mcg daily, tolerating well without side effects.  Overdue for recheck of her thyroid as her last levels were abnormal approximately 7 months ago. Has been doing well since she was here last. No hair loss/changes, dry skin, brittle nails, unusual anxiety, palpitations, or tremors. No heat or cold intolerance.   Has noted easy bruising to her forearms for over a year. Is pretty sure it's related to age but wants to make sure.   Urine has been smelling really bad lately, almost like burnt rubber. Admits to getting dehydrated regularly due to not drinking enough. No burning or frequency. Some urgency and associated urge incontinence.    Objective:    Vitals:   03/16/22 1459  BP: 130/89  Pulse: 88  Height: 5\' 5"  (1.651 m)  Weight: 135 lb (61.2 kg)  SpO2: 92%  BMI (Calculated): 22.47   Physical Exam Vitals and nursing note reviewed.  Constitutional:      General: She is not in acute distress.    Appearance: Normal appearance. She is not ill-appearing.  HENT:     Head: Normocephalic and atraumatic.  Cardiovascular:     Rate and Rhythm: Normal rate and regular rhythm.     Pulses: Normal pulses.     Heart sounds: Normal heart sounds.  Pulmonary:     Effort: Pulmonary effort is normal. No respiratory distress.     Breath sounds: Normal breath sounds. No wheezing, rhonchi or rales.  Skin:    General: Skin is warm and dry.     Findings: Bruising (Bilateral forearms, scattered) present.  Neurological:     Mental Status: She is alert and oriented to person, place, and time.  Psychiatric:        Mood and Affect: Mood normal.        Behavior: Behavior  normal.        Thought Content: Thought content normal.        Judgment: Judgment normal.    Results for orders placed or performed in visit on 03/16/22 (from the past 24 hour(s))  POCT URINALYSIS DIP (CLINITEK)     Status: Abnormal   Collection Time: 03/16/22  4:46 PM  Result Value Ref Range   Color, UA yellow yellow   Clarity, UA clear clear   Glucose, UA negative negative mg/dL   Bilirubin, UA negative negative   Ketones, POC UA trace (5) (A) negative mg/dL   Spec Grav, UA 5.638 9.373 - 1.025   Blood, UA negative negative   pH, UA 7.0 5.0 - 8.0   POC PROTEIN,UA trace negative, trace   Urobilinogen, UA 1.0 0.2 or 1.0 E.U./dL   Nitrite, UA Positive (A) Negative   Leukocytes, UA Negative Negative       The 10-year ASCVD risk score (Arnett DK, et al., 2019) is: 2.9%   Values used to calculate the score:     Age: 71 years     Sex: Female     Is Non-Hispanic African American: No     Diabetic: No     Tobacco smoker: Yes     Systolic Blood Pressure: 130 mmHg     Is BP treated: No  HDL Cholesterol: 75 mg/dL     Total Cholesterol: 174 mg/dL   Assessment & Plan:   1. Hypothyroidism due to Hashimoto's thyroiditis Checking TSH today.  Continue levothyroxine 112 mcg daily, titrate pending lab results. - TSH  2. Abnormal urine odor POCT UA positive for nitrites and leukocytes, treating for acute cystitis with Macrobid 100 mg twice daily x5 days.  Sending for culture for further evaluation.  Wet prep to evaluate for BV given abnormal odor. - WET PREP FOR TRICH, YEAST, CLUE - Urine Culture - POCT URINALYSIS DIP (CLINITEK)  3. Easy bruisability Suspect this is related to aging and the thinning of collagen on the skin.  Recommend making sure to your avoid trauma to the area.  May benefit from looking into collagen supplements if desired and making sure to take a daily multivitamin.  Return if symptoms worsen or fail to improve.  Further follow-up pending lab  results.  ___________________________________________ Clearnce Sorrel, DNP, APRN, FNP-BC [facility] [place]

## 2022-03-17 LAB — TSH: TSH: 1.64 mIU/L

## 2022-03-20 LAB — URINE CULTURE
MICRO NUMBER:: 13506412
SPECIMEN QUALITY:: ADEQUATE

## 2022-04-05 ENCOUNTER — Other Ambulatory Visit: Payer: Self-pay | Admitting: Medical-Surgical

## 2022-04-05 DIAGNOSIS — E038 Other specified hypothyroidism: Secondary | ICD-10-CM

## 2022-05-09 ENCOUNTER — Other Ambulatory Visit: Payer: Self-pay | Admitting: Medical-Surgical

## 2022-05-09 DIAGNOSIS — E038 Other specified hypothyroidism: Secondary | ICD-10-CM

## 2022-06-09 ENCOUNTER — Other Ambulatory Visit: Payer: Self-pay | Admitting: Medical-Surgical

## 2022-06-09 DIAGNOSIS — E038 Other specified hypothyroidism: Secondary | ICD-10-CM

## 2022-07-13 ENCOUNTER — Other Ambulatory Visit: Payer: Self-pay | Admitting: Medical-Surgical

## 2022-07-13 DIAGNOSIS — E038 Other specified hypothyroidism: Secondary | ICD-10-CM

## 2022-07-13 NOTE — Telephone Encounter (Signed)
Last OV in June 2023 with normal TSH. Refills sent. Due for next TSH in about 8 months.

## 2022-07-13 NOTE — Telephone Encounter (Signed)
Last office visit 02/15/2021  Last filled 06/14/2022

## 2022-11-29 ENCOUNTER — Encounter: Payer: Self-pay | Admitting: Medical-Surgical

## 2022-12-01 ENCOUNTER — Ambulatory Visit (INDEPENDENT_AMBULATORY_CARE_PROVIDER_SITE_OTHER): Payer: No Typology Code available for payment source | Admitting: Medical-Surgical

## 2022-12-01 VITALS — BP 131/80 | HR 84 | Resp 18 | Ht 65.0 in | Wt 141.0 lb

## 2022-12-01 DIAGNOSIS — E038 Other specified hypothyroidism: Secondary | ICD-10-CM | POA: Diagnosis not present

## 2022-12-01 DIAGNOSIS — R8290 Unspecified abnormal findings in urine: Secondary | ICD-10-CM

## 2022-12-01 DIAGNOSIS — Z0000 Encounter for general adult medical examination without abnormal findings: Secondary | ICD-10-CM | POA: Diagnosis not present

## 2022-12-01 DIAGNOSIS — F1020 Alcohol dependence, uncomplicated: Secondary | ICD-10-CM

## 2022-12-01 DIAGNOSIS — Z113 Encounter for screening for infections with a predominantly sexual mode of transmission: Secondary | ICD-10-CM | POA: Diagnosis not present

## 2022-12-01 DIAGNOSIS — Z13220 Encounter for screening for lipoid disorders: Secondary | ICD-10-CM

## 2022-12-01 DIAGNOSIS — E063 Autoimmune thyroiditis: Secondary | ICD-10-CM

## 2022-12-01 LAB — WET PREP FOR TRICH, YEAST, CLUE
MICRO NUMBER:: 14606855
Specimen Quality: ADEQUATE

## 2022-12-01 LAB — POCT URINALYSIS DIP (CLINITEK)
Bilirubin, UA: NEGATIVE
Blood, UA: NEGATIVE
Glucose, UA: NEGATIVE mg/dL
Ketones, POC UA: NEGATIVE mg/dL
Nitrite, UA: POSITIVE — AB
POC PROTEIN,UA: NEGATIVE
Spec Grav, UA: 1.01 (ref 1.010–1.025)
Urobilinogen, UA: 0.2 E.U./dL
pH, UA: 7 (ref 5.0–8.0)

## 2022-12-01 MED ORDER — SULFAMETHOXAZOLE-TRIMETHOPRIM 800-160 MG PO TABS: 1.0000 | ORAL_TABLET | Freq: Two times a day (BID) | ORAL | 0 refills | Status: DC

## 2022-12-01 NOTE — Progress Notes (Signed)
Established Patient Office Visit  Subjective   Patient ID: Victoria Travis, female   DOB: [birth year] Age: 57 y.o. MRN: [id number]   Chief Complaint  Patient presents with   urinary odor   HPI Pleasant 57 year old female presenting today for evaluation of urinary odor and vaginal discharge.  She is currently sexually active with 2 female partners and reports that they are using no protection.  Recently she has noted that her urine smells like burnt rubber and one of her partners made a comment reflecting the same the other day.  She is here to get reevaluated since she has had these issues in the past but her testing came back normal.  Denies burning, urgency, frequency, and hematuria.  No flank pain, back pain, suprapubic pressure, or abdominal cramping.  Reports her vaginal discharge is the same as always and has not changed recently.  It is thin and clear to milky in color.   Objective:    Vitals:   12/01/22 1017 12/01/22 1018  BP: 131/80 131/80  Pulse: 84 84  Resp: 18   Height: '5\' 5"'$  (1.651 m)   Weight: 141 lb 0.6 oz (64 kg)   SpO2: 97% 97%  BMI (Calculated): 23.47     Physical Exam Vitals reviewed.  Constitutional:      General: She is not in acute distress.    Appearance: Normal appearance. She is not ill-appearing.  HENT:     Head: Normocephalic and atraumatic.  Cardiovascular:     Rate and Rhythm: Normal rate and regular rhythm.     Pulses: Normal pulses.     Heart sounds: Normal heart sounds.  Pulmonary:     Effort: Pulmonary effort is normal. No respiratory distress.     Breath sounds: Normal breath sounds. No wheezing, rhonchi or rales.  Skin:    General: Skin is warm and dry.  Neurological:     Mental Status: She is alert and oriented to person, place, and time.  Psychiatric:        Mood and Affect: Mood normal.        Behavior: Behavior normal.        Thought Content: Thought content normal.        Judgment: Judgment normal.    Results for orders placed or  performed in visit on 12/01/22 (from the past 24 hour(s))  POCT URINALYSIS DIP (CLINITEK)     Status: Abnormal   Collection Time: 12/01/22 11:04 AM  Result Value Ref Range   Color, UA yellow yellow   Clarity, UA clear clear   Glucose, UA negative negative mg/dL   Bilirubin, UA negative negative   Ketones, POC UA negative negative mg/dL   Spec Grav, UA 1.010 1.010 - 1.025   Blood, UA negative negative   pH, UA 7.0 5.0 - 8.0   POC PROTEIN,UA negative negative, trace   Urobilinogen, UA 0.2 0.2 or 1.0 E.U./dL   Nitrite, UA Positive (A) Negative   Leukocytes, UA Trace (A) Negative       The ASCVD Risk score (Arnett DK, et al., 2019) failed to calculate for the following reasons:   Cannot find a previous HDL lab   Cannot find a previous total cholesterol lab   Assessment & Plan:   1. Hypothyroidism due to Hashimoto's thyroiditis Checking TSH today.  Continue levothyroxine 112 mcg daily.  Titrate dosing depending on results. - TSH  2. Abnormal urine odor POCT urinalysis positive for nitrites and leukocytes.  Sending for culture.  We will  go ahead and start on Bactrim twice daily x 3 days.  Stat wet prep sent.  Checking for chlamydia and gonorrhea. - POCT URINALYSIS DIP (CLINITEK) - Urine Culture - WET PREP FOR TRICH, YEAST, CLUE - C. trachomatis/N. gonorrhoeae RNA  3. Routine screening for STI (sexually transmitted infection) STI testing as below per patient request. - RPR - Hepatitis B surface antigen - Hepatitis C antibody - HIV Antibody (routine testing w rflx) - C. trachomatis/N. gonorrhoeae RNA  4. Preventative health care Checking labs as below. - CBC with Differential/Platelet - COMPLETE METABOLIC PANEL WITH GFR  5. Uncomplicated alcohol dependence ([place]) Checking CMP for liver function.  Recommend reducing alcohol intake to no more than 1 drink per day according to healthy living guidelines. - COMPLETE METABOLIC PANEL WITH GFR  6. Lipid screening Checking lipid  panel today. - Lipid panel  Return if symptoms worsen or fail to improve.  ___________________________________________ Clearnce Sorrel, DNP, APRN, FNP-BC [facility] [place]

## 2022-12-02 LAB — C. TRACHOMATIS/N. GONORRHOEAE RNA
C. trachomatis RNA, TMA: NOT DETECTED
N. gonorrhoeae RNA, TMA: NOT DETECTED

## 2022-12-04 ENCOUNTER — Ambulatory Visit: Payer: Self-pay | Admitting: Medical-Surgical

## 2022-12-04 LAB — COMPLETE METABOLIC PANEL WITH GFR
AG Ratio: 1.8 (calc) (ref 1.0–2.5)
ALT: 114 U/L — ABNORMAL HIGH (ref 6–29)
AST: 145 U/L — ABNORMAL HIGH (ref 10–35)
Albumin: 4.3 g/dL (ref 3.6–5.1)
Alkaline phosphatase (APISO): 61 U/L (ref 37–153)
BUN: 7 mg/dL (ref 7–25)
CO2: 24 mmol/L (ref 20–32)
Calcium: 9.2 mg/dL (ref 8.6–10.4)
Chloride: 100 mmol/L (ref 98–110)
Creat: 0.63 mg/dL (ref 0.50–1.03)
Globulin: 2.4 g/dL (calc) (ref 1.9–3.7)
Glucose, Bld: 133 mg/dL — ABNORMAL HIGH (ref 65–99)
Potassium: 3.9 mmol/L (ref 3.5–5.3)
Sodium: 141 mmol/L (ref 135–146)
Total Bilirubin: 0.5 mg/dL (ref 0.2–1.2)
Total Protein: 6.7 g/dL (ref 6.1–8.1)
eGFR: 105 mL/min/{1.73_m2} (ref >=60)

## 2022-12-04 LAB — CBC WITH DIFFERENTIAL/PLATELET
Absolute Monocytes: 570 cells/uL (ref 200–950)
Basophils Absolute: 37 cells/uL (ref 0–200)
Basophils Relative: 0.6 %
Eosinophils Absolute: 93 cells/uL (ref 15–500)
Eosinophils Relative: 1.5 %
HCT: 38.5 % (ref 35.0–45.0)
Hemoglobin: 13.5 g/dL (ref 11.7–15.5)
Lymphs Abs: 1606 cells/uL (ref 850–3900)
MCH: 34.7 pg — ABNORMAL HIGH (ref 27.0–33.0)
MCHC: 35.1 g/dL (ref 32.0–36.0)
MCV: 99 fL (ref 80.0–100.0)
MPV: 11.7 fL (ref 7.5–12.5)
Monocytes Relative: 9.2 %
Neutro Abs: 3894 cells/uL (ref 1500–7800)
Neutrophils Relative %: 62.8 %
Platelets: 266 10*3/uL (ref 140–400)
RBC: 3.89 10*6/uL (ref 3.80–5.10)
RDW: 15.3 % — ABNORMAL HIGH (ref 11.0–15.0)
Total Lymphocyte: 25.9 %
WBC: 6.2 10*3/uL (ref 3.8–10.8)

## 2022-12-04 LAB — URINE CULTURE
MICRO NUMBER:: 14607207
SPECIMEN QUALITY:: ADEQUATE

## 2022-12-04 LAB — HEPATITIS B SURFACE ANTIGEN: Hepatitis B Surface Ag: NONREACTIVE

## 2022-12-04 LAB — LIPID PANEL
Cholesterol: 207 mg/dL — ABNORMAL HIGH (ref <200)
HDL: 127 mg/dL (ref >=50)
LDL Cholesterol (Calc): 67 mg/dL (calc)
Non-HDL Cholesterol (Calc): 80 mg/dL (calc) (ref <130)
Total CHOL/HDL Ratio: 1.6 (calc) (ref <5.0)
Triglycerides: 53 mg/dL (ref <150)

## 2022-12-04 LAB — RPR: RPR Ser Ql: NONREACTIVE

## 2022-12-04 LAB — HEPATITIS C ANTIBODY: Hepatitis C Ab: NONREACTIVE

## 2022-12-04 LAB — TSH: TSH: 2.77 mIU/L

## 2022-12-04 LAB — HIV ANTIBODY (ROUTINE TESTING W REFLEX): HIV 1&2 Ab, 4th Generation: NONREACTIVE

## 2022-12-28 ENCOUNTER — Encounter: Payer: Self-pay | Admitting: Medical-Surgical

## 2022-12-28 NOTE — Telephone Encounter (Signed)
I would recommend appt to evaluate this.

## 2023-01-02 ENCOUNTER — Ambulatory Visit: Payer: No Typology Code available for payment source | Admitting: Medical-Surgical

## 2023-01-02 ENCOUNTER — Encounter: Payer: Self-pay | Admitting: Medical-Surgical

## 2023-01-02 VITALS — BP 143/89 | HR 81 | Resp 20 | Ht 65.0 in | Wt 139.1 lb

## 2023-01-02 DIAGNOSIS — Z1211 Encounter for screening for malignant neoplasm of colon: Secondary | ICD-10-CM | POA: Insufficient documentation

## 2023-01-02 DIAGNOSIS — L309 Dermatitis, unspecified: Secondary | ICD-10-CM | POA: Diagnosis not present

## 2023-01-02 DIAGNOSIS — Z1231 Encounter for screening mammogram for malignant neoplasm of breast: Secondary | ICD-10-CM | POA: Insufficient documentation

## 2023-01-02 MED ORDER — METHYLPREDNISOLONE 4 MG PO TBPK: ORAL_TABLET | ORAL | 0 refills | Status: DC

## 2023-01-02 MED ORDER — DESONIDE 0.05 % EX CREA: TOPICAL_CREAM | Freq: Two times a day (BID) | CUTANEOUS | 0 refills | Status: DC

## 2023-01-02 MED ORDER — METHYLPREDNISOLONE SODIUM SUCC 40 MG IJ SOLR
40.0000 mg | Freq: Once | INTRAMUSCULAR | Status: AC
  Administered 2023-01-02: 40 mg via INTRAMUSCULAR

## 2023-01-02 MED ORDER — FLUOCINONIDE EMULSIFIED BASE 0.05 % EX CREA: 1.0000 | TOPICAL_CREAM | Freq: Two times a day (BID) | CUTANEOUS | 1 refills | Status: DC

## 2023-01-02 MED ORDER — METHYLPREDNISOLONE SODIUM SUCC 125 MG IJ SOLR: 125.0000 mg | Freq: Once | INTRAMUSCULAR | Status: DC

## 2023-01-02 NOTE — Assessment & Plan Note (Signed)
Mammogram ordered

## 2023-01-02 NOTE — Assessment & Plan Note (Signed)
Referring to GI for colonoscopy. ?

## 2023-01-02 NOTE — Assessment & Plan Note (Signed)
History of having issues with eczema.  Notes an issue a couple of years ago with severe eczema that was treated at urgent care with Lidex cream and steroids.  That worked very well for her and she has been able to manage her symptoms since then.  Unfortunately, 2 weeks ago, she had a flare of eczema that affected her face, back, and upper lips.  At 1 point, she had severe swelling, redness, and pain along the upper lip.  She began using the leftover Lidex cream she had at home for couple of days and the redness began to resolve.  After that, she notes that the area looked like it was burned and it began to peel.  When it looks like it was completely resolving, she unfortunately developed some areas on her cheeks as well as the upper back at the base of the neck.  Reports that it is very itchy and uncomfortable.  The only thing she uses on her face is a light moisturizer for sensitive skin.  No other cosmetics, chemicals, medications, etc.  Because of the severity of her symptoms and the location affected, we will go ahead and treat with Solu-Medrol IM x 1 in office.  Add a Medrol Dosepak to start tomorrow.  Refilling Lidex ointment but advised to use this twice daily only for up to 4 to 5 days.  Adding desonide for spot treatment when symptoms are not quite so severe.

## 2023-01-02 NOTE — Progress Notes (Signed)
        Established patient visit  Brief history, exam, impression, and plan:  Severe eczema History of having issues with eczema.  Notes an issue a couple of years ago with severe eczema that was treated at urgent care with Lidex cream and steroids.  That worked very well for her and she has been able to manage her symptoms since then.  Unfortunately, 2 weeks ago, she had a flare of eczema that affected her face, back, and upper lips.  At 1 point, she had severe swelling, redness, and pain along the upper lip.  She began using the leftover Lidex cream she had at home for couple of days and the redness began to resolve.  After that, she notes that the area looked like it was burned and it began to peel.  When it looks like it was completely resolving, she unfortunately developed some areas on her cheeks as well as the upper back at the base of the neck.  Reports that it is very itchy and uncomfortable.  The only thing she uses on her face is a light moisturizer for sensitive skin.  No other cosmetics, chemicals, medications, etc.  Because of the severity of her symptoms and the location affected, we will go ahead and treat with Solu-Medrol IM x 1 in office.  Add a Medrol Dosepak to start tomorrow.  Refilling Lidex ointment but advised to use this twice daily only for up to 4 to 5 days.  Adding desonide for spot treatment when symptoms are not quite so severe.  Colon cancer screening Referring to GI for colonoscopy.  Encounter for screening mammogram for malignant neoplasm of breast Mammogram ordered.   Procedures performed this visit: None.  Return if symptoms worsen or fail to improve.  __________________________________ Clearnce Sorrel, DNP, APRN, FNP-BC [facility] [place]

## 2023-01-08 IMAGING — CT CT ANGIO CHEST
2 of 8 series · 19 of 36 positions shown · IV contrast (Omnipaque)
Comparison: 08/02/2019

CLINICAL DATA: Cough, shortness of breath

EXAM:
CT ANGIOGRAPHY CHEST WITH CONTRAST
TECHNIQUE: Multidetector CT imaging of the chest was performed using the
standard protocol during bolus administration of intravenous
contrast. Multiplanar CT image reconstructions and MIPs were
obtained to evaluate the vascular anatomy.

[Series 5: pe thins · axial · 0.71mm/px · z∈[-276,-34]mm · 18 of 271 slices shown]
[im 15/271  lung]
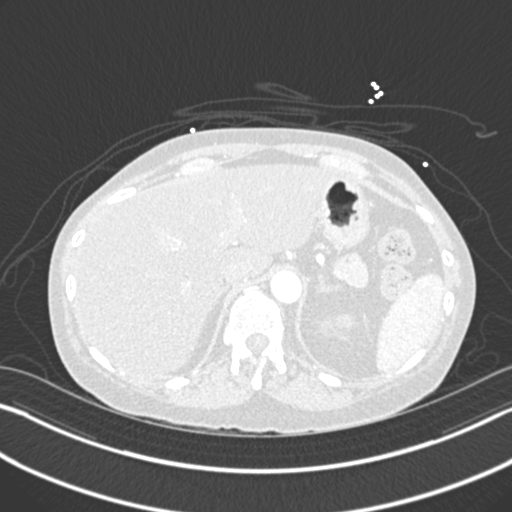
[im 29/271  mediastinal]
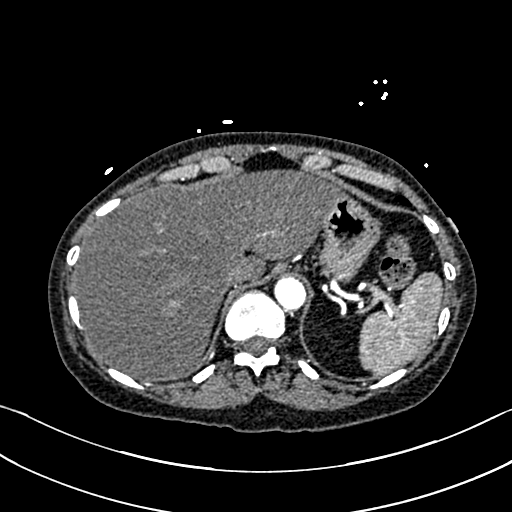
[im 43/271  lung]
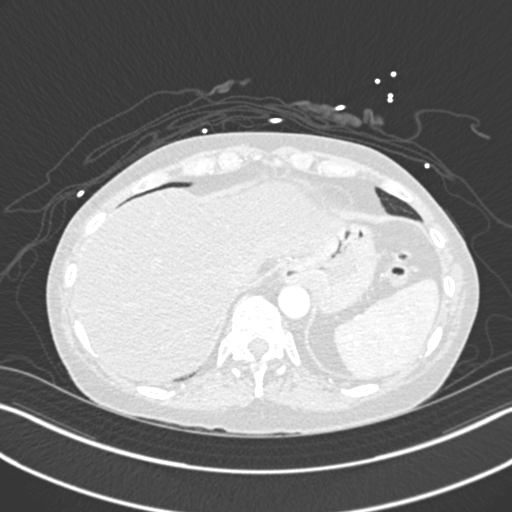
[im 57/271  mediastinal]
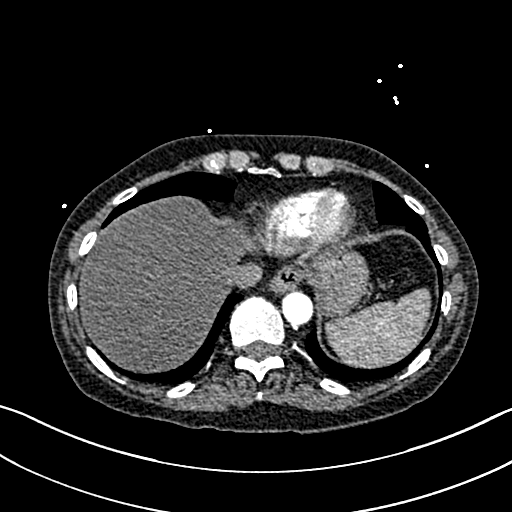
[im 72/271  lung]
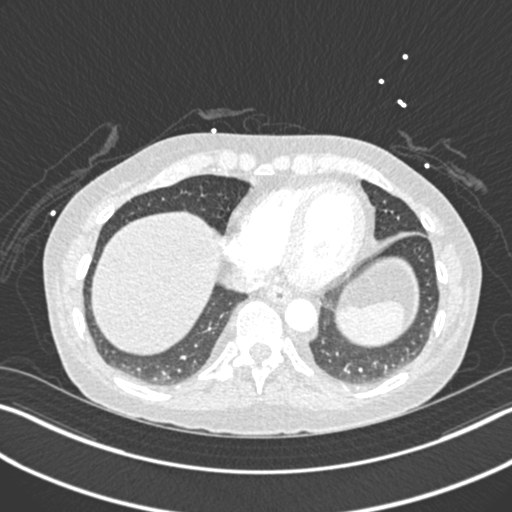
[im 86/271  mediastinal]
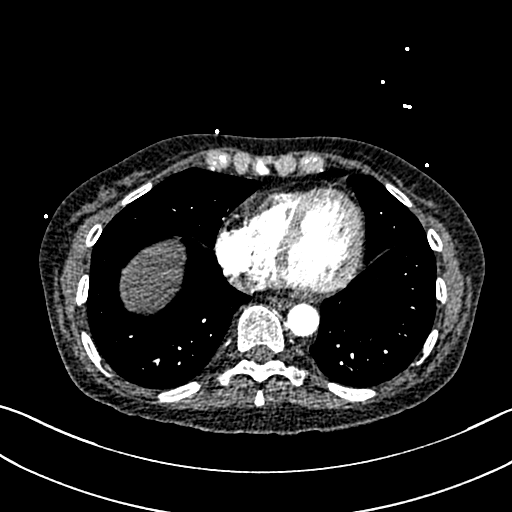
[im 100/271  lung]
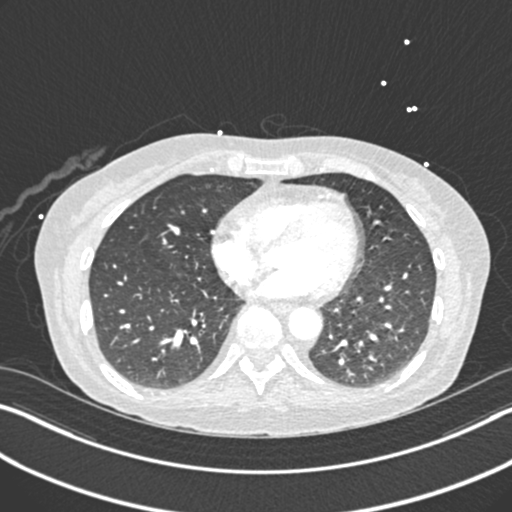
[im 114/271  mediastinal]
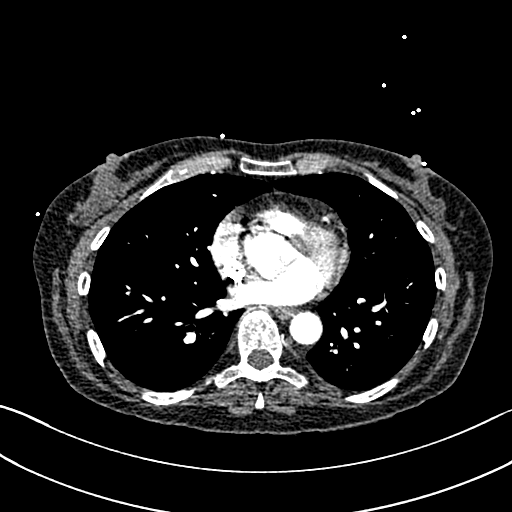
[im 128/271  lung]
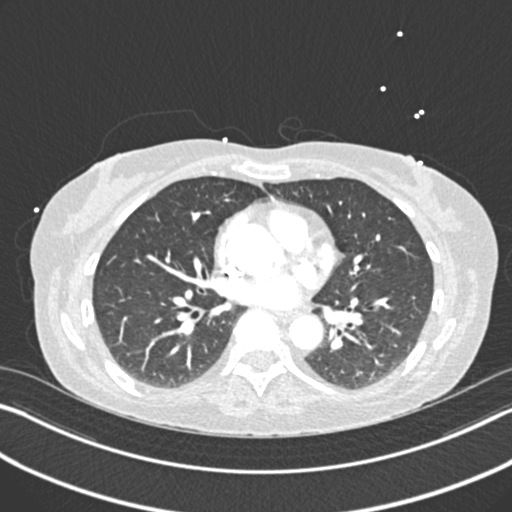
[im 143/271  mediastinal]
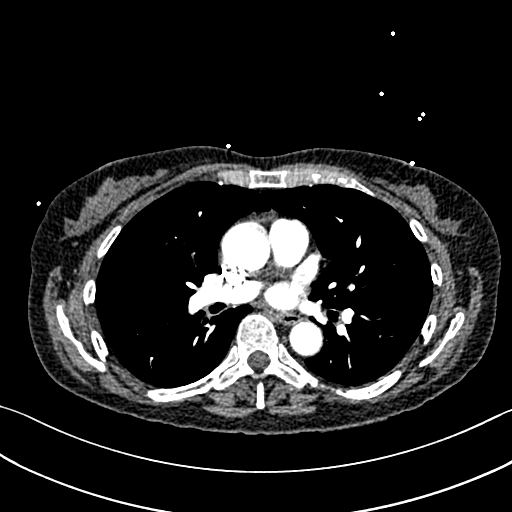
[im 157/271  lung]
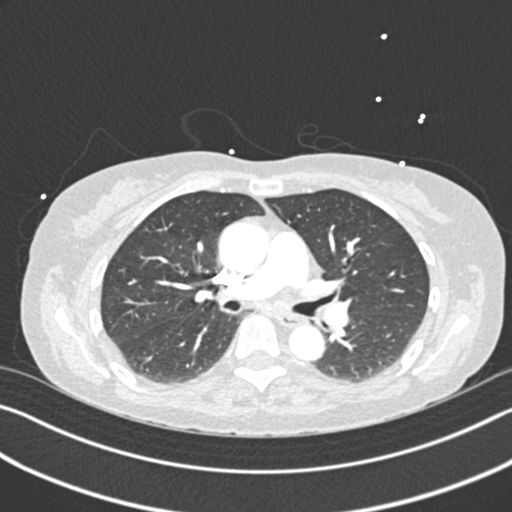
[im 171/271  mediastinal]
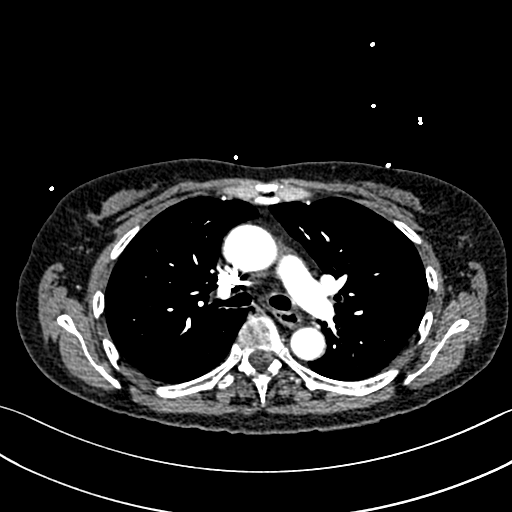
[im 185/271  lung]
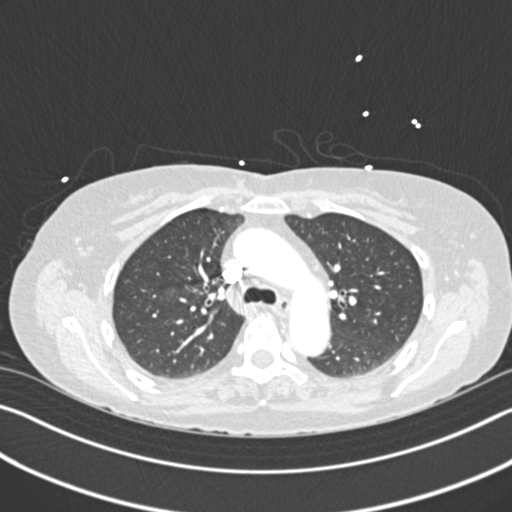
[im 199/271  mediastinal]
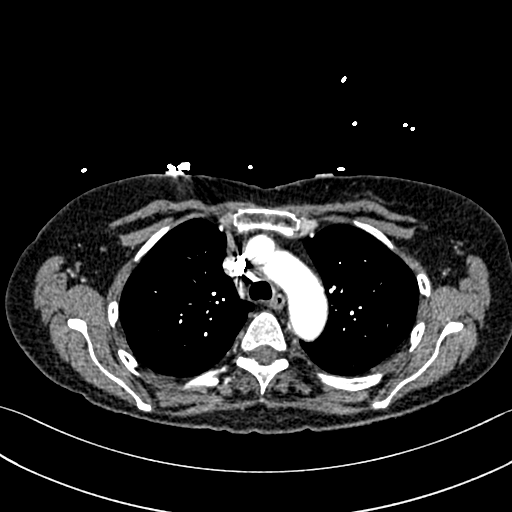
[im 214/271  lung]
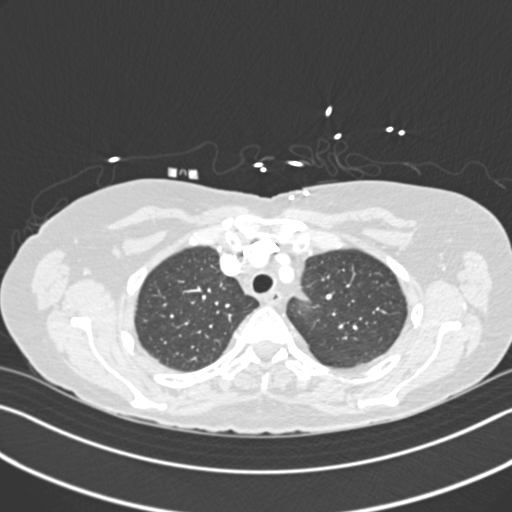
[im 228/271  mediastinal]
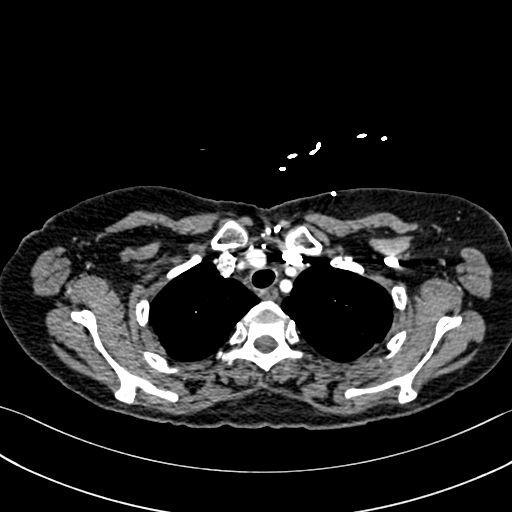
[im 242/271  lung]
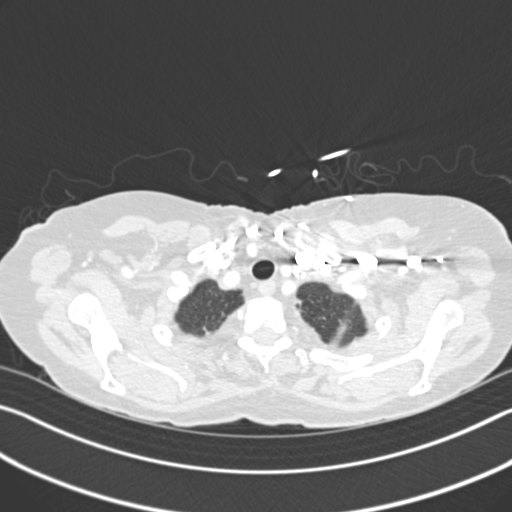
[im 256/271  mediastinal]
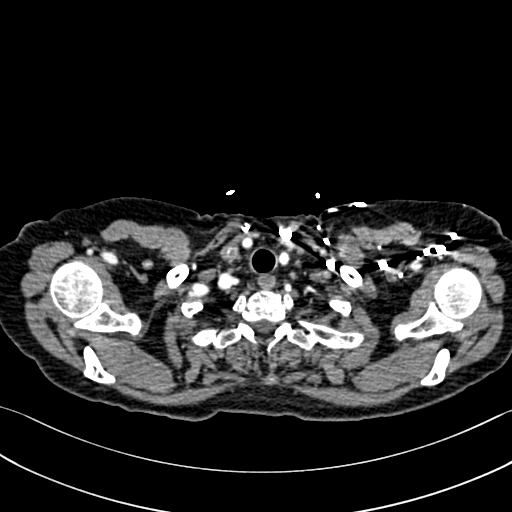

[Series 7: pe coronal mpr · coronal · 0.57mm/px · 1 of 151 slices shown]
[im 76/151  mediastinal]
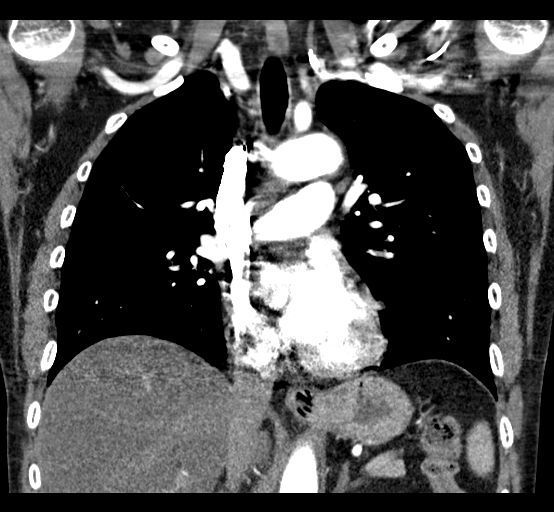

[19 of 36 positions shown; findings below may reference images not displayed]

RADIATION DOSE REDUCTION: This exam was performed according to the
departmental dose-optimization program which includes automated
exposure control, adjustment of the mA and/or kV according to
patient size and/or use of iterative reconstruction technique.

CONTRAST:  100mL OMNIPAQUE IOHEXOL 350 MG/ML SOLN
FINDINGS: Cardiovascular: No filling defects in the pulmonary arteries to
suggest pulmonary emboli. Heart is normal size. Aorta is normal
caliber.

Mediastinum/Nodes: No mediastinal, hilar, or axillary adenopathy.
Trachea and esophagus are unremarkable. Thyroid unremarkable.

Lungs/Pleura: Lungs are clear. No focal airspace opacities or
suspicious nodules. No effusions.

Upper Abdomen: Low-density throughout the liver suggesting fatty
infiltration

Musculoskeletal: Chest wall soft tissues are unremarkable. No acute
bony abnormality.

Review of the MIP images confirms the above findings.
IMPRESSION: No evidence of pulmonary embolus.

No acute cardiopulmonary disease.

Hepatic steatosis.

## 2023-05-18 ENCOUNTER — Encounter: Payer: Self-pay | Admitting: Medical-Surgical

## 2023-05-23 ENCOUNTER — Encounter: Payer: Self-pay | Admitting: Medical-Surgical

## 2023-06-18 ENCOUNTER — Other Ambulatory Visit: Payer: Self-pay | Admitting: Medical-Surgical

## 2023-06-18 DIAGNOSIS — E063 Autoimmune thyroiditis: Secondary | ICD-10-CM

## 2023-07-16 ENCOUNTER — Encounter: Payer: Self-pay | Admitting: Emergency Medicine

## 2023-07-16 ENCOUNTER — Ambulatory Visit
Admission: EM | Admit: 2023-07-16 | Discharge: 2023-07-16 | Disposition: A | Discharge status: Home / Self Care | Payer: No Typology Code available for payment source | Attending: Family Medicine | Admitting: Family Medicine

## 2023-07-16 DIAGNOSIS — R1110 Vomiting, unspecified: Secondary | ICD-10-CM | POA: Diagnosis not present

## 2023-07-16 DIAGNOSIS — E860 Dehydration: Secondary | ICD-10-CM | POA: Diagnosis not present

## 2023-07-16 LAB — POCT URINALYSIS DIP (MANUAL ENTRY)
Glucose, UA: NEGATIVE mg/dL
Leukocytes, UA: NEGATIVE
Nitrite, UA: NEGATIVE
Protein Ur, POC: 100 mg/dL — AB
Spec Grav, UA: >=1.03 — AB (ref 1.010–1.025)
Urobilinogen, UA: 0.2 U/dL — AB
pH, UA: 6.5 (ref 5.0–8.0)

## 2023-07-16 MED ORDER — ONDANSETRON 8 MG PO TBDP: 8.0000 mg | ORAL_TABLET | Freq: Three times a day (TID) | ORAL | 0 refills | Status: DC | PRN

## 2023-07-16 MED ORDER — ONDANSETRON HCL 4 MG/2ML IJ SOLN
4.0000 mg | Freq: Once | INTRAMUSCULAR | Status: AC
  Administered 2023-07-16: 4 mg via INTRAVENOUS

## 2023-07-16 MED ORDER — SODIUM CHLORIDE 0.9 % IV BOLUS
1000.0000 mL | Freq: Once | INTRAVENOUS | Status: AC
Start: 2023-07-16 — End: 2023-07-16
  Administered 2023-07-16: 1000 mL via INTRAVENOUS

## 2023-07-16 NOTE — Discharge Instructions (Addendum)
Make sure you drink lots of clear fluids.  It is important to prevent dehydration Take Zofran as needed.  I have prescribed the dissolvable tablet for easier use If your vomiting becomes worse, or is persistent, go to the emergency room

## 2023-07-16 NOTE — ED Provider Notes (Signed)
[name]    CSN: [id number] Arrival date & time: 07/16/23  1133      History   Chief Complaint Chief Complaint  Patient presents with   Emesis    HPI Victoria Travis is a 57 y.o. female.   Patient states that she has had protracted repeated projectile vomiting since yesterday.  She feels like she becomes dehydrated easily.  She states that when she stands up she feels like she is going to pass out.  Mild abdominal cramping.  No diarrhea.  She states that she gets vomiting spells from stress, illness, certain foods.  She has not had any recent travel.    Past Medical History:  Diagnosis Date   Alcohol dependence (HCC)    Depression    Hypothyroidism 06/11/2012   Hypothyroidism 06/11/2012   IBS (irritable bowel syndrome)    Thyroid disease     Patient Active Problem List   Diagnosis Date Noted   Severe eczema 01/02/2023   Colon cancer screening 01/02/2023   Encounter for screening mammogram for malignant neoplasm of breast 01/02/2023   Cannabis abuse 02/15/2021   Menopausal syndrome 02/15/2021   Hypothyroidism due to Hashimoto's thyroiditis 06/17/2020   MDD (major depressive disorder), severe (HCC) 03/06/2018    History reviewed. No pertinent surgical history.  OB History   No obstetric history on file.      Home Medications    Prior to Admission medications   Medication Sig Start Date End Date Taking? Authorizing Provider  levothyroxine (SYNTHROID) 112 MCG tablet NEEDS APPOINTMENT FOR FURTHER REFILLS. TAKE 1 TABLET BY MOUTH ONCE DAILY. 06/18/23  Yes Jessup, Joy, NP  ondansetron (ZOFRAN-ODT) 8 MG disintegrating tablet Take 1 tablet (8 mg total) by mouth every 8 (eight) hours as needed for nausea or vomiting. 07/16/23  Yes Eustace Moore, MD    Family History Family History  Problem Relation Age of Onset   Heart attack Father        Triple Bypass with stents    Social History Social History   Tobacco Use   Smoking status: Every Day     Current packs/day: 0.50    Average packs/day: 0.5 packs/day for 3.8 years (1.9 ttl pk-yrs)    Types: Cigarettes    Start date: 10/10/2019   Smokeless tobacco: Never  Vaping Use   Vaping status: Never Used  Substance Use Topics   Alcohol use: Yes    Alcohol/week: 14.0 standard drinks of alcohol    Types: 14 Shots of liquor per week    Comment: 1/2 gallon vodka every 9 days - drinks daily - 4 large drinks   Drug use: Yes    Frequency: 1.0 times per week    Types: Marijuana     Allergies   Patient has no known allergies.   Review of Systems Review of Systems See HPI  Physical Exam Triage Vital Signs ED Triage Vitals  Encounter Vitals Group     BP 07/16/23 1148 (!) 145/110     Systolic BP Percentile --      Diastolic BP Percentile --      Pulse Rate 07/16/23 1148 75     Resp 07/16/23 1148 16     Temp 07/16/23 1148 98.2 F (36.8 C)     Temp Source 07/16/23 1148 Oral     SpO2 07/16/23 1148 99 %     Weight 07/16/23 1150 122 lb (55.3 kg)     Height 07/16/23 1150 5\' 5"  (1.651 m)  Head Circumference --      Peak Flow --      Pain Score 07/16/23 1149 6     Pain Loc --      Pain Education --      Exclude from Growth Chart --    No data found.  Updated Vital Signs BP 126/88 (BP Location: Left Arm)   Pulse 76   Temp 98.2 F (36.8 C) (Oral)   Resp 16   Ht 5\' 5"  (1.651 m)   Wt 55.3 kg   SpO2 99%   BMI 20.30 kg/m       Physical Exam Constitutional:      General: She is not in acute distress.    Appearance: She is well-developed.  HENT:     Head: Normocephalic and atraumatic.     Mouth/Throat:     Mouth: Mucous membranes are dry.  Eyes:     Conjunctiva/sclera: Conjunctivae normal.     Pupils: Pupils are equal, round, and reactive to light.  Cardiovascular:     Rate and Rhythm: Normal rate.  Pulmonary:     Effort: Pulmonary effort is normal. No respiratory distress.  Abdominal:     General: Bowel sounds are normal. There is no distension.     Palpations:  Abdomen is soft.     Tenderness: There is no abdominal tenderness.  Musculoskeletal:        General: Normal range of motion.     Cervical back: Normal range of motion.  Skin:    General: Skin is warm and dry.     Comments: Tenting of skin  Neurological:     Mental Status: She is alert.      UC Treatments / Results  Labs (all labs ordered are listed, but only abnormal results are displayed) Labs Reviewed  POCT URINALYSIS DIP (MANUAL ENTRY) - Abnormal; Notable for the following components:      Result Value   Color, UA orange (*)    Bilirubin, UA moderate (*)    Ketones, POC UA >= (160) (*)    Spec Grav, UA >=1.030 (*)    Blood, UA trace-intact (*)    Protein Ur, POC =100 (*)    Urobilinogen, UA 0.2 (*)    All other components within normal limits    EKG   Radiology No results found.  Procedures Procedures (including critical care time)  Medications Ordered in UC Medications  sodium chloride 0.9 % bolus 1,000 mL (0 mLs Intravenous Stopped 07/16/23 1410)  ondansetron (ZOFRAN) injection 4 mg (4 mg Intravenous Given 07/16/23 1238)    Initial Impression / Assessment and Plan / UC Course  I have reviewed the triage vital signs and the nursing notes.  Pertinent labs & imaging results that were available during my care of the patient were reviewed by me and considered in my medical decision making (see chart for details).     Patient states after her liter of fluid she feels much better.  Is sent home with Zofran.  The importance of preventing dehydration is emphasized Final Clinical Impressions(s) / UC Diagnoses   Final diagnoses:  Intractable vomiting  Dehydration, mild     Discharge Instructions      Make sure you drink lots of clear fluids.  It is important to prevent dehydration Take Zofran as needed.  I have prescribed the dissolvable tablet for easier use If your vomiting becomes worse, or is persistent, go to the emergency room     ED Prescriptions  Medication Sig Dispense Auth. Provider   ondansetron (ZOFRAN-ODT) 8 MG disintegrating tablet Take 1 tablet (8 mg total) by mouth every 8 (eight) hours as needed for nausea or vomiting. 20 tablet Eustace Moore, MD      PDMP not reviewed this encounter.   Eustace Moore, MD 07/16/23 [phone]

## 2023-07-16 NOTE — ED Triage Notes (Signed)
Patient c/o vomiting, abdominal pain since yesterday.  Patient states she is dehydrated.  Patient is taken Zofran unable to keep that down.  Afebrile.  Diarrhea yesterday.  Not able to keep anything down.  Projectile vomiting.  Requesting an IV.

## 2023-10-07 ENCOUNTER — Other Ambulatory Visit: Payer: Self-pay | Admitting: Medical-Surgical

## 2023-10-07 DIAGNOSIS — E063 Autoimmune thyroiditis: Secondary | ICD-10-CM

## 2023-11-05 ENCOUNTER — Other Ambulatory Visit: Payer: Self-pay | Admitting: Medical-Surgical

## 2023-11-05 DIAGNOSIS — E063 Autoimmune thyroiditis: Secondary | ICD-10-CM

## 2023-11-10 ENCOUNTER — Other Ambulatory Visit: Payer: Self-pay | Admitting: Medical-Surgical

## 2023-11-10 DIAGNOSIS — E063 Autoimmune thyroiditis: Secondary | ICD-10-CM

## 2023-11-19 ENCOUNTER — Encounter: Payer: Self-pay | Admitting: Medical-Surgical

## 2023-11-19 DIAGNOSIS — E7800 Pure hypercholesterolemia, unspecified: Secondary | ICD-10-CM | POA: Insufficient documentation

## 2023-12-11 ENCOUNTER — Encounter: Payer: Self-pay | Admitting: Medical-Surgical

## 2023-12-11 ENCOUNTER — Ambulatory Visit (INDEPENDENT_AMBULATORY_CARE_PROVIDER_SITE_OTHER): Payer: Self-pay | Admitting: Medical-Surgical

## 2023-12-11 VITALS — BP 132/95 | HR 89 | Ht 65.0 in | Wt 124.0 lb

## 2023-12-11 DIAGNOSIS — Z1231 Encounter for screening mammogram for malignant neoplasm of breast: Secondary | ICD-10-CM

## 2023-12-11 DIAGNOSIS — Z1211 Encounter for screening for malignant neoplasm of colon: Secondary | ICD-10-CM

## 2023-12-11 DIAGNOSIS — E063 Autoimmune thyroiditis: Secondary | ICD-10-CM

## 2023-12-11 DIAGNOSIS — Z124 Encounter for screening for malignant neoplasm of cervix: Secondary | ICD-10-CM

## 2023-12-11 MED ORDER — LEVOTHYROXINE SODIUM 112 MCG PO TABS: ORAL_TABLET | ORAL | 3 refills | Status: AC

## 2023-12-11 NOTE — Progress Notes (Unsigned)
   Established patient visit  History, exam, impression, and plan:  No problem-specific Assessment & Plan notes found for this encounter.   ROS  Physical Exam  Procedures performed this visit: None.  No follow-ups on file.  __________________________________ Thayer Ohm, DNP, APRN, FNP-BC Primary Care and Sports Medicine [facility]

## 2023-12-12 ENCOUNTER — Encounter: Payer: Self-pay | Admitting: Medical-Surgical

## 2024-01-03 ENCOUNTER — Ambulatory Visit: Payer: Self-pay

## 2024-06-01 ENCOUNTER — Ambulatory Visit
Admission: EM | Admit: 2024-06-01 | Discharge: 2024-06-01 | Disposition: A | Discharge status: Home / Self Care | Payer: Self-pay | Attending: Family Medicine | Admitting: Family Medicine

## 2024-06-01 ENCOUNTER — Other Ambulatory Visit: Payer: Self-pay

## 2024-06-01 DIAGNOSIS — R1030 Lower abdominal pain, unspecified: Secondary | ICD-10-CM | POA: Insufficient documentation

## 2024-06-01 DIAGNOSIS — N3001 Acute cystitis with hematuria: Secondary | ICD-10-CM | POA: Insufficient documentation

## 2024-06-01 DIAGNOSIS — K589 Irritable bowel syndrome without diarrhea: Secondary | ICD-10-CM | POA: Insufficient documentation

## 2024-06-01 LAB — POCT URINALYSIS DIP (MANUAL ENTRY)
Bilirubin, UA: NEGATIVE
Glucose, UA: NEGATIVE mg/dL
Leukocytes, UA: NEGATIVE
Nitrite, UA: POSITIVE — AB
Spec Grav, UA: 1.015 (ref 1.010–1.025)
Urobilinogen, UA: 0.2 U/dL
pH, UA: 6 (ref 5.0–8.0)

## 2024-06-01 MED ORDER — CEFDINIR 300 MG PO CAPS: 300.0000 mg | ORAL_CAPSULE | Freq: Two times a day (BID) | ORAL | 0 refills | Status: AC

## 2024-06-01 NOTE — ED Provider Notes (Signed)
 Victoria Travis    CSN: [id number] Arrival date & time: 06/01/24  1237      History   Chief Complaint Chief Complaint  Patient presents with   Abdominal Pain    HPI Victoria Travis is a 58 y.o. female.   HPI 58 year old female presents with 5 days of abdominal pain and pelvic and lower abdominal region.  Reports pain is crampy in nature, intermittent on and off but severe when pain comes.  Patient reports this feels like her menstrual period but reports 9 out of 10 pain.  Reports nausea and loose stools which is normal with her IBS.  PMH significant for alcohol dependence, depression, and IBS.  Past Medical History:  Diagnosis Date   Alcohol dependence (HCC)    Depression    Hypothyroidism 06/11/2012   Hypothyroidism 06/11/2012   IBS (irritable bowel syndrome)    Thyroid  disease     Patient Active Problem List   Diagnosis Date Noted   Elevated cholesterol 11/19/2023   Severe eczema 01/02/2023   Encounter for screening mammogram for malignant neoplasm of breast 01/02/2023   Cannabis abuse 02/15/2021   Menopausal syndrome 02/15/2021   Hypothyroidism due to Hashimoto's thyroiditis 06/17/2020   MDD (major depressive disorder), severe (HCC) 03/06/2018    History reviewed. No pertinent surgical history.  OB History   No obstetric history on file.      Home Medications    Prior to Admission medications   Medication Sig Start Date End Date Taking? Authorizing Provider  cefdinir  (OMNICEF ) 300 MG capsule Take 1 capsule (300 mg total) by mouth 2 (two) times daily for 7 days. 06/01/24 06/08/24 Yes Teddy Sharper, FNP  levothyroxine  (SYNTHROID ) 112 MCG tablet TAKE 1 TABLET BY MOUTH ONCE DAILY . 12/11/23   Willo Mini, NP  ondansetron  (ZOFRAN -ODT) 8 MG disintegrating tablet Take 1 tablet (8 mg total) by mouth every 8 (eight) hours as needed for nausea or vomiting. 07/16/23   Maranda Jamee Jacob, MD    Family History Family History  Problem Relation Age of Onset   Healthy  Mother    Heart attack Father        Triple Bypass with stents    Social History Social History   Tobacco Use   Smoking status: Every Day    Current packs/day: 0.50    Average packs/day: 0.5 packs/day for 4.6 years (2.3 ttl pk-yrs)    Types: Cigarettes    Start date: 10/10/2019   Smokeless tobacco: Never  Vaping Use   Vaping status: Never Used  Substance Use Topics   Alcohol use: Yes    Alcohol/week: 14.0 standard drinks of alcohol    Types: 14 Shots of liquor per week    Comment: 1/2 gallon vodka every 9 days - drinks daily - 4 large drinks   Drug use: Yes    Frequency: 1.0 times per week    Types: Marijuana     Allergies   Patient has no known allergies.   Review of Systems Review of Systems  Gastrointestinal:  Positive for abdominal pain.  All other systems reviewed and are negative.    Physical Exam Triage Vital Signs ED Triage Vitals  Encounter Vitals Group     BP      Girls Systolic BP Percentile      Girls Diastolic BP Percentile      Boys Systolic BP Percentile      Boys Diastolic BP Percentile      Pulse      Resp  Temp      Temp src      SpO2      Weight      Height      Head Circumference      Peak Flow      Pain Score      Pain Loc      Pain Education      Exclude from Growth Chart    No data found.  Updated Vital Signs BP (!) 151/114   Pulse 92   Temp 98.5 F (36.9 C)   Resp 20   LMP 02/15/2021 (Exact Date)   SpO2 98%   Visual Acuity Right Eye Distance:   Left Eye Distance:   Bilateral Distance:    Right Eye Near:   Left Eye Near:    Bilateral Near:     Physical Exam Vitals and nursing note reviewed.  Constitutional:      Appearance: Normal appearance. She is obese.  HENT:     Head: Normocephalic and atraumatic.     Mouth/Throat:     Mouth: Mucous membranes are moist.     Pharynx: Oropharynx is clear.  Eyes:     Extraocular Movements: Extraocular movements intact.     Conjunctiva/sclera: Conjunctivae normal.      Pupils: Pupils are equal, round, and reactive to light.  Cardiovascular:     Rate and Rhythm: Normal rate and regular rhythm.     Pulses: Normal pulses.     Heart sounds: Normal heart sounds.  Pulmonary:     Effort: Pulmonary effort is normal.     Breath sounds: Normal breath sounds. No wheezing, rhonchi or rales.  Abdominal:     General: Bowel sounds are normal. There is no distension.     Palpations: There is no mass.     Tenderness: There is no abdominal tenderness. There is no right CVA tenderness, left CVA tenderness, guarding or rebound.     Hernia: No hernia is present.  Musculoskeletal:        General: Normal range of motion.  Skin:    General: Skin is warm and dry.  Neurological:     General: No focal deficit present.     Mental Status: She is alert and oriented to person, place, and time. Mental status is at baseline.  Psychiatric:        Mood and Affect: Mood normal.        Behavior: Behavior normal.      UC Treatments / Results  Labs (all labs ordered are listed, but only abnormal results are displayed) Labs Reviewed  POCT URINALYSIS DIP (MANUAL ENTRY) - Abnormal; Notable for the following components:      Result Value   Ketones, POC UA moderate (40) (*)    Blood, UA small (*)    Protein Ur, POC trace (*)    Nitrite, UA Positive (*)    All other components within normal limits  URINE CULTURE    EKG   Radiology No results found.  Procedures Procedures (including critical Travis time)  Medications Ordered in UC Medications - No data to display  Initial Impression / Assessment and Plan / UC Course  I have reviewed the triage vital signs and the nursing notes.  Pertinent labs & imaging results that were available during my Travis of the patient were reviewed by me and considered in my medical decision making (see chart for details).     MDM: 1.  Acute cystitis with hematuria-UA revealed above, urine culture  ordered, Rx'd cefdinir  300 mg capsule:  Take 1 capsule twice daily x 7 days; 2.  Lower abdominal pain-presumably from UTI. Advised patient to take medication as directed with food to completion.  Encouraged increase daily water intake to 64 ounces per day while taking this medication.  Advised if symptoms worsen and/or unresolved please follow-up with your PCP or go to [facility] ED for further evaluation of lower abdominal pain.  Patient discharged home, hemodynamically stable. Final Clinical Impressions(s) / UC Diagnoses   Final diagnoses:  Acute cystitis with hematuria  Lower abdominal pain     Discharge Instructions      Advised patient to take medication as directed with food to completion.  Encouraged increase daily water intake to 64 ounces per day while taking this medication.  Advised if symptoms worsen and/or unresolved please follow-up with your PCP or go to [facility] ED for further evaluation of lower abdominal pain.     ED Prescriptions     Medication Sig Dispense Auth. Provider   cefdinir  (OMNICEF ) 300 MG capsule Take 1 capsule (300 mg total) by mouth 2 (two) times daily for 7 days. 14 capsule Eyden Dobie, FNP      PDMP not reviewed this encounter.   Teddy Sharper, FNP 06/01/24 1404

## 2024-06-01 NOTE — ED Triage Notes (Signed)
 Pt presents to uc with co 5 days of abdominal pain in pelvic and lower abdomin she reports this pain is crampy in nature and has been on and off but is severe when it happens. Reports feeling similar to a menstrual cycle but has not had one in 8 yrs.  Pt reports 9/10 pain and has been using motrin . Denies dysuria. Endorses nausea and lose stools which is normal for her IBS

## 2024-06-01 NOTE — Discharge Instructions (Addendum)
 Advised patient to take medication as directed with food to completion.  Encouraged increase daily water intake to 64 ounces per day while taking this medication.  Advised if symptoms worsen and/or unresolved please follow-up with your PCP or go to [facility] ED for further evaluation of lower abdominal pain.

## 2024-06-04 LAB — URINE CULTURE: Culture: >=100000 — AB

## 2024-06-05 ENCOUNTER — Ambulatory Visit (HOSPITAL_COMMUNITY): Payer: Self-pay

## 2024-07-20 ENCOUNTER — Ambulatory Visit
Admission: EM | Admit: 2024-07-20 | Discharge: 2024-07-20 | Disposition: A | Discharge status: Home / Self Care | Payer: Self-pay | Attending: Family Medicine | Admitting: Family Medicine

## 2024-07-20 ENCOUNTER — Encounter: Payer: Self-pay | Admitting: Emergency Medicine

## 2024-07-20 DIAGNOSIS — R1024 Suprapubic pain: Secondary | ICD-10-CM | POA: Insufficient documentation

## 2024-07-20 LAB — POCT URINE DIPSTICK
Bilirubin, UA: NEGATIVE
Blood, UA: NEGATIVE
Glucose, UA: NEGATIVE mg/dL
Leukocytes, UA: NEGATIVE
Nitrite, UA: POSITIVE — AB
Protein Ur, POC: 30 mg/dL — AB
Spec Grav, UA: >=1.03 — AB (ref 1.010–1.025)
Urobilinogen, UA: 0.2 U/dL
pH, UA: 6 (ref 5.0–8.0)

## 2024-07-20 MED ORDER — NITROFURANTOIN MONOHYD MACRO 100 MG PO CAPS: 100.0000 mg | ORAL_CAPSULE | Freq: Two times a day (BID) | ORAL | 0 refills | Status: DC

## 2024-07-20 NOTE — ED Provider Notes (Signed)
 [name]    CSN: [id number] Arrival date & time: 07/20/24  1137      History   Chief Complaint Chief Complaint  Patient presents with   Abdominal Pain    HPI Victoria Travis is a 58 y.o. female.   HPI  Patient has had suprapubic pain and cramping.  Dysuria and frequency.  She is worried about a UTI.  She states her urine does have a strong odor.  She does not have any fever or chills.  No flank pain.  No nausea or vomiting. She does express frustration at getting urinary tract infections more frequently.  Past Medical History:  Diagnosis Date   Alcohol dependence (HCC)    Depression    Hypothyroidism 06/11/2012   Hypothyroidism 06/11/2012   IBS (irritable bowel syndrome)    Thyroid  disease     Patient Active Problem List   Diagnosis Date Noted   Elevated cholesterol 11/19/2023   Severe eczema 01/02/2023   Encounter for screening mammogram for malignant neoplasm of breast 01/02/2023   Cannabis abuse 02/15/2021   Menopausal syndrome 02/15/2021   Hypothyroidism due to Hashimoto's thyroiditis 06/17/2020   MDD (major depressive disorder), severe (HCC) 03/06/2018    History reviewed. No pertinent surgical history.  OB History   No obstetric history on file.      Home Medications    Prior to Admission medications   Medication Sig Start Date End Date Taking? Authorizing Provider  levothyroxine  (SYNTHROID ) 112 MCG tablet TAKE 1 TABLET BY MOUTH ONCE DAILY . 12/11/23  Yes Willo Mini, NP  nitrofurantoin , macrocrystal-monohydrate, (MACROBID ) 100 MG capsule Take 1 capsule (100 mg total) by mouth 2 (two) times daily. 07/20/24  Yes Maranda Jamee Jacob, MD    Family History Family History  Problem Relation Age of Onset   Healthy Mother    Heart attack Father        Triple Bypass with stents    Social History Social History   Tobacco Use   Smoking status: Every Day    Current packs/day: 0.50    Average packs/day: 0.5 packs/day for 4.8 years (2.4 ttl  pk-yrs)    Types: Cigarettes    Start date: 10/10/2019   Smokeless tobacco: Never  Vaping Use   Vaping status: Never Used  Substance Use Topics   Alcohol use: Yes    Alcohol/week: 14.0 standard drinks of alcohol    Types: 14 Shots of liquor per week    Comment: 1/2 gallon vodka every 9 days - drinks daily - 4 large drinks   Drug use: Yes    Frequency: 1.0 times per week    Types: Marijuana     Allergies   Patient has no known allergies.   Review of Systems Review of Systems  See HPI Physical Exam Triage Vital Signs ED Triage Vitals  Encounter Vitals Group     BP 07/20/24 1214 (!) 144/97     Girls Systolic BP Percentile --      Girls Diastolic BP Percentile --      Boys Systolic BP Percentile --      Boys Diastolic BP Percentile --      Pulse Rate 07/20/24 1214 (!) 102     Resp 07/20/24 1214 18     Temp 07/20/24 1214 98.6 F (37 C)     Temp Source 07/20/24 1214 Oral     SpO2 07/20/24 1214 97 %     Weight --      Height --  Head Circumference --      Peak Flow --      Pain Score 07/20/24 1213 0     Pain Loc --      Pain Education --      Exclude from Growth Chart --    No data found.  Updated Vital Signs BP (!) 144/97 (BP Location: Right Arm)   Pulse (!) 102   Temp 98.6 F (37 C) (Oral)   Resp 18   LMP 02/15/2021 (Exact Date)   SpO2 97%       Physical Exam Constitutional:      General: She is not in acute distress.    Appearance: She is well-developed and normal weight.  HENT:     Head: Normocephalic and atraumatic.  Eyes:     Conjunctiva/sclera: Conjunctivae normal.     Pupils: Pupils are equal, round, and reactive to light.  Cardiovascular:     Rate and Rhythm: Normal rate.  Pulmonary:     Effort: Pulmonary effort is normal. No respiratory distress.  Abdominal:     General: There is no distension.     Palpations: Abdomen is soft.     Tenderness: There is no right CVA tenderness or left CVA tenderness.  Musculoskeletal:        General:  Normal range of motion.     Cervical back: Normal range of motion.  Skin:    General: Skin is warm and dry.  Neurological:     Mental Status: She is alert.      UC Treatments / Results  Labs (all labs ordered are listed, but only abnormal results are displayed) Labs Reviewed  POCT URINE DIPSTICK - Abnormal; Notable for the following components:      Result Value   Color, UA other (*)    Ketones, POC UA >= (160) (*)    Spec Grav, UA >=1.030 (*)    Protein Ur, POC =30 (*)    Nitrite, UA Positive (*)    All other components within normal limits  URINE CULTURE    EKG   Radiology No results found.  Procedures Procedures (including critical care time)  Medications Ordered in UC Medications - No data to display  Initial Impression / Assessment and Plan / UC Course  I have reviewed the triage vital signs and the nursing notes.  Pertinent labs & imaging results that were available during my care of the patient were reviewed by me and considered in my medical decision making (see chart for details).     Urinalysis is positive for nitrites.  I feel like she likely has a urinary tract infection.  Will send for culture and treat her with antibiotics.  Final Clinical Impressions(s) / UC Diagnoses   Final diagnoses:  Suprapubic pain     Discharge Instructions      Take the macrobid  ( nitrofurantion) 2 x a day Drink lots of water Talk to your PCP about estrogen therapy   ED Prescriptions     Medication Sig Dispense Auth. Provider   nitrofurantoin , macrocrystal-monohydrate, (MACROBID ) 100 MG capsule Take 1 capsule (100 mg total) by mouth 2 (two) times daily. 10 capsule Maranda Jamee Jacob, MD      PDMP not reviewed this encounter.   Maranda Jamee Jacob, MD 07/20/24 [phone]

## 2024-07-20 NOTE — Discharge Instructions (Signed)
 Take the macrobid  ( nitrofurantion) 2 x a day Drink lots of water Talk to your PCP about estrogen therapy

## 2024-07-20 NOTE — ED Triage Notes (Signed)
 Patient c/o lower abdominal pain and cramping for several days.  Possible UTI.  Urine does have a strong odor.  Some urgency and frequency.

## 2024-07-21 ENCOUNTER — Telehealth: Payer: Self-pay

## 2024-07-21 NOTE — Telephone Encounter (Signed)
 Attempted to reach for protocol

## 2024-07-22 ENCOUNTER — Ambulatory Visit (HOSPITAL_COMMUNITY): Payer: Self-pay

## 2024-07-22 LAB — URINE CULTURE
Culture: >=100000 — AB
Special Requests: NORMAL

## 2024-07-25 ENCOUNTER — Ambulatory Visit: Payer: Self-pay | Admitting: Medical-Surgical

## 2024-07-25 NOTE — Telephone Encounter (Signed)
 Copied from CRM [id number]. Topic: Clinical - Medication Question >> Jul 25, 2024  9:24 AM Myrick T wrote: Reason for CRM: patient called stated she is still having cramping in her vaginal area with the constant need to urine. She has finished the antibiotic that was prescribed to her last week but she needs another antibiotic that is stronger yet easier on her stomach. Please f/u with patient >> Jul 25, 2024  3:16 PM Sophia H wrote: Patient is following back up on request for call back from clinic regarding getting an antibiotic sent in. Needs call back ASAP, spoke with NT this morning, # [phone]  [facility] [address], [place] - [address]   >> Jul 25, 2024 10:30 AM Myrick T wrote: Patient returning NT call

## 2024-07-25 NOTE — Telephone Encounter (Signed)
 This RN made first attempt to contact patient with no answer. A voicemail was left with call back number provided.      Copied from CRM [id number]. Topic: Clinical - Medication Question >> Jul 25, 2024  9:24 AM Myrick T wrote: Reason for CRM: patient called stated she is still having cramping in her vaginal area with the constant need to urine. She has finished the antibiotic that was prescribed to her last week but she needs another antibiotic that is stronger yet easier on her stomach. Please f/u with patient

## 2024-07-25 NOTE — Telephone Encounter (Signed)
 Patient is asking for another antibiotic to be sent into her pharmacy. Patient reports continued urinary urgency and frequency. Patient states she finished the last antibiotic but symptoms didn't go away. Patient not able to come into the office until next month when her insurance kicks in. Patient is asking for a follow up call with recommendations.  FYI Only or Action Required?: Action required by provider: clinical question for provider.  Patient was last seen in primary care on 12/11/2023 by Willo Mini, NP.  Called Nurse Triage reporting Urinary Frequency and Urinary Urgency.  Symptoms began a week ago.  Interventions attempted: Rest, hydration, or home remedies.  Symptoms are: unchanged.  Triage Disposition: See Physician Within 24 Hours  Patient/caregiver understands and will follow disposition?: No, wishes to speak with PCP  Reason for Disposition  Urinating more frequently than usual (i.e., frequency) OR new-onset of the feeling of an urgent need to urinate (i.e., urgency)  Answer Assessment - Initial Assessment Questions 1. SYMPTOM: What's the main symptom you're concerned about? (e.g., frequency, incontinence)     Urinary urgency, urinary frequency 2. ONSET: When did the  urinary urgency, urinary frequency  start?     1.5 weeks ago 3. PAIN: Is there any pain? If Yes, ask: How bad is it? (Scale: 1-10; mild, moderate, severe)     Mild  4. CAUSE: What do you think is causing the symptoms?     Patient is feeling like she is still having symptoms of UTI 5. OTHER SYMPTOMS: Do you have any other symptoms? (e.g., blood in urine, fever, flank pain, pain with urination)     Cramping in pelvic area,  Protocols used: Urinary Symptoms-A-AH

## 2024-07-28 ENCOUNTER — Ambulatory Visit (INDEPENDENT_AMBULATORY_CARE_PROVIDER_SITE_OTHER): Payer: Self-pay | Admitting: Physician Assistant

## 2024-07-28 ENCOUNTER — Ambulatory Visit: Payer: Self-pay

## 2024-07-28 VITALS — BP 130/70 | HR 88 | Temp 98.0°F | Ht 65.0 in | Wt 124.0 lb

## 2024-07-28 DIAGNOSIS — N390 Urinary tract infection, site not specified: Secondary | ICD-10-CM | POA: Insufficient documentation

## 2024-07-28 DIAGNOSIS — R300 Dysuria: Secondary | ICD-10-CM | POA: Insufficient documentation

## 2024-07-28 DIAGNOSIS — N3000 Acute cystitis without hematuria: Secondary | ICD-10-CM | POA: Insufficient documentation

## 2024-07-28 DIAGNOSIS — R1024 Suprapubic pain: Secondary | ICD-10-CM | POA: Insufficient documentation

## 2024-07-28 DIAGNOSIS — R350 Frequency of micturition: Secondary | ICD-10-CM | POA: Insufficient documentation

## 2024-07-28 LAB — POCT URINALYSIS DIP (CLINITEK)
Bilirubin, UA: NEGATIVE
Blood, UA: NEGATIVE
Glucose, UA: NEGATIVE mg/dL
Ketones, POC UA: NEGATIVE mg/dL
Nitrite, UA: POSITIVE — AB
POC PROTEIN,UA: NEGATIVE
Spec Grav, UA: 1.02 (ref 1.010–1.025)
Urobilinogen, UA: 2 U/dL — AB
pH, UA: 6.5 (ref 5.0–8.0)

## 2024-07-28 MED ORDER — CIPROFLOXACIN HCL 500 MG PO TABS: 500.0000 mg | ORAL_TABLET | Freq: Two times a day (BID) | ORAL | 0 refills | Status: AC

## 2024-07-28 NOTE — Telephone Encounter (Signed)
 The patient was re-evaluated today by [facility]. The patient was provided a different antibiotic. Per the patient, she has already taken the first dose.

## 2024-07-28 NOTE — Progress Notes (Signed)
 "  Acute Office Visit  Subjective:     Patient ID: Victoria Travis, female    DOB: [birth year], 58 y.o.   MRN: [id number]  Chief Complaint  Patient presents with   Dysuria    HPI .SABRADiscussed the use of AI scribe software for clinical note transcription with the patient, who gave verbal consent to proceed.  History of Present Illness Victoria Travis is a 58 year old female with recurrent urinary tract infections who presents with pelvic pain and urinary symptoms.  Pelvic pain and pelvic floor sensation - Heavy pelvic cramping - Sensation as if pelvic floor is about to fall out - Pelvic pain is impacting professional life due to high work demands  Urinary symptoms - Persistent urinary symptoms following completion of Macrobid  for UTI diagnosed on July 20, 2024 - Urine test showed evidence of bacteria, specifically nitrates - No blood in urine - No new vaginal discharge, itching, or irritation - No history of yeast infections following antibiotic use  Recurrent urinary tract infections - Third episode of UTI requiring medical attention - History of recurrent UTIs  Antibiotic adverse reactions and irritable bowel syndrome - Previous adverse reaction to an antibiotic starting with 'C' in late August, causing severe diarrhea - History of irritable bowel syndrome, complicating management of antibiotic-induced diarrhea, 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      Objective:    BP 130/70   Pulse 88   Temp 98 F (36.7 C) (Temporal)   Ht 5' 5 (1.651 m)   Wt 124 lb (56.2 kg)   LMP 02/15/2021 (Exact Date)   SpO2 99%   BMI 20.63 kg/m  BP Readings from Last 3 Encounters:  07/28/24 130/70  07/20/24 (!) 144/97  06/01/24 (!) 151/114   Wt Readings from Last 3 Encounters:  07/28/24 124 lb (56.2 kg)  12/11/23 124 lb (56.2 kg)  07/16/23 122  lb (55.3 kg)    .Victoria Travis Results for orders placed or performed in visit on 07/28/24  POCT URINALYSIS DIP (CLINITEK)   Collection Time: 07/28/24 11:35 AM  Result Value Ref Range   Color, UA other (A) yellow   Clarity, UA cloudy (A) clear   Glucose, UA negative negative mg/dL   Bilirubin, UA negative negative   Ketones, POC UA negative negative mg/dL   Spec Grav, UA 8.979 8.989 - 1.025   Blood, UA negative negative   pH, UA 6.5 5.0 - 8.0   POC PROTEIN,UA negative negative, trace   Urobilinogen, UA 2.0 (A) 0.2 or 1.0 E.U./dL   Nitrite, UA Positive (A) Negative   Leukocytes, UA       Physical Exam Constitutional:      Appearance: Normal appearance.  HENT:     Head: Normocephalic.  Cardiovascular:     Rate and Rhythm: Normal rate and regular rhythm.  Pulmonary:     Effort: Pulmonary effort is normal.     Breath sounds: Normal breath sounds.  Abdominal:     General: There is no distension.     Palpations: Abdomen is soft. There is no mass.     Tenderness: There is abdominal tenderness. There is no right CVA tenderness, left CVA tenderness, guarding or rebound.     Comments: Suprapubic tenderness to palpation.   Musculoskeletal:     Right lower leg: No edema.     Left lower leg: No edema.  Neurological:     Mental Status: She is alert.  Psychiatric:        Mood and Affect: Mood normal.          Assessment & Plan:  SABRASABRAYasheka was seen today for dysuria.  Diagnoses and all orders for this visit:  Recurrent UTI (urinary tract infection) -     ciprofloxacin  (CIPRO ) 500 MG tablet; Take 1 tablet (500 mg total) by mouth 2 (two) times daily for 10 days.  Dysuria -     POCT URINALYSIS DIP (CLINITEK) -     Urine Culture -     NuSwab Vaginitis Plus (VG+)  Acute cystitis without hematuria -     ciprofloxacin  (CIPRO ) 500 MG tablet; Take 1 tablet (500 mg total) by mouth 2 (two) times daily for 10 days.  Suprapubic pressure  Frequent urination   Assessment & Plan Recurrent  urinary tract infection with dysuria, pelvic pressure, urinary frequency Recurrent UTI with persistent dysuria and pelvic pressure. Previous Macrobid  treatment ineffective. Urine culture from 10/12 shows sensitivity to macrobid . No systemic infection signs or kidney involvement. Previous adverse reaction to cefdinir  causing diarrhea. Menopausal changes may contribute to recurrence. UA today shows nitrites. - Order urine culture for bacterial identification and antibiotic sensitivity. - Switch antibiotic based on culture results, sent ciprofloxicn to start today for 10 days - Consider estrogen cream for urethral laxity due to menopause for recurrent UTIs - nuswab for other causes of symptoms ordered today - Consider pelvic ultrasound if symptoms persist post-antibiotic treatment.    Kyvon Hu, PA-C   "

## 2024-07-28 NOTE — Patient Instructions (Addendum)
 Start cipro  for 10 days.  Keep drinking lots of water.  Follow up as needed if symptoms persist or worsen.   Urinary Tract Infection, Female A urinary tract infection (UTI) is an infection in your urinary tract. The urinary tract is made up of organs that make, store, and get rid of pee (urine) in your body. These organs include: The kidneys. The ureters. The bladder. The urethra. What are the causes? Most UTIs are caused by germs called bacteria. They may be in or near your genitals. These germs grow and cause swelling in your urinary tract. What increases the risk? You're more likely to get a UTI if: You're a female. The urethra is shorter in females than in males. You have a soft tube called a catheter that drains your pee. You can't control when you pee or poop. You have trouble peeing because of: A kidney stone. A urinary blockage. A nerve condition that affects your bladder. Not getting enough to drink. You're sexually active. You use a birth control inside your vagina, like spermicide. You're pregnant. You have low levels of the hormone estrogen in your body. You're an older adult. You're also more likely to get a UTI if you have other health problems. These may include: Diabetes. A weak immune system. Your immune system is your body's defense system. Sickle cell disease. Injury of the spine. What are the signs or symptoms? Symptoms may include: Needing to pee right away. Peeing small amounts often. Pain or burning when you pee. Blood in your pee. Pee that smells bad or odd. Pain in your belly or lower back. You may also: Feel confused. This may be the first symptom in older adults. Vomit. Not feel hungry. Feel tired or easily annoyed. Have a fever or chills. How is this diagnosed? A UTI is diagnosed based on your medical history and an exam. You may also have other tests. These may include: Pee tests. Blood tests. Tests for sexually transmitted infections  (STIs). If you've had more than one UTI, you may need to have imaging studies done to find out why you keep getting them. How is this treated? A UTI can be treated by: Taking antibiotics or other medicines. Drinking enough fluid to keep your pee pale yellow. In rare cases, a UTI can cause a very bad condition called sepsis. Sepsis may be treated in the hospital. Follow these instructions at home: Medicines Take your medicines only as told by your health care provider. If you were given antibiotics, take them as told by your provider. Do not stop taking them even if you start to feel better. General instructions Make sure you: Pee often and fully. Do not hold your pee for a long time. Wipe from front to back after you pee or poop. Use each tissue only once when you wipe. Pee after you have sex. Do not douche or use sprays or powders in your genital area. Contact a health care provider if: Your symptoms don't get better after 1-2 days of taking antibiotics. Your symptoms go away and then come back. You have a fever or chills. You vomit or feel like you may vomit. Get help right away if: You have very bad pain in your back or lower belly. You faint. This information is not intended to replace advice given to you by your health care provider. Make sure you discuss any questions you have with your health care provider. Document Revised: 09/05/2023 Document Reviewed: 12/29/2022 Elsevier Patient Education  2025 [employer].

## 2024-07-28 NOTE — Telephone Encounter (Signed)
 I didn't realize they scheduled her an appointment after she called in. Please disregard. Thank you

## 2024-07-28 NOTE — Telephone Encounter (Signed)
 FYI Only or Action Required?: Action required by provider: clinical question for provider and update on patient condition.  Patient was last seen in primary care on 12/11/2023 by Victoria Mini, NP.  Called Nurse Triage reporting No chief complaint on file..  Symptoms began several weeks ago.  Interventions attempted: Prescription medications: Macrobid .  Symptoms are: gradually worsening.  Triage Disposition: See Physician Within 24 Hours  Patient/caregiver understands and will follow disposition?: Yes  Copied from CRM #[id number]. Topic: Clinical - Red Word Triage >> Jul 28, 2024  9:53 AM Alfonso ORN wrote: Red Word that prompted transfer to Nurse Triage: patient need another prescription for an antibotic , the antibotic that was prescribe is not working patient  has an UTI having cramping in her abdomen rate pain about a 8  , strong odor, having pressure and hard time urinating Reason for Disposition  [1] Taking antibiotic > 72 hours (3 days) for UTI AND [2] painful urination or frequency is SAME (unchanged, not better)  Answer Assessment - Initial Assessment Questions 1. MAIN SYMPTOM: What is the main symptom you are concerned about? (e.g., painful urination, urine frequency)     Frequency, Cramping, Painful Urination, Urgency, Concentrated Odor, Urine Color Change   2. BETTER-SAME-WORSE: Are you getting better, staying the same, or getting worse compared to how you felt at your last visit to the doctor (most recent medical visit)?     Same  3. PAIN: How bad is the pain?  (e.g., Scale 1-10; mild, moderate, or severe)     Mild to Moderate  4. FEVER: Do you have a fever? If Yes, ask: What is it, how was it measured, and when did it start?    No  5. OTHER SYMPTOMS: Do you have any other symptoms? (e.g., blood in the urine, flank pain, vaginal discharge)     Dark Yellow Urine, Flank Pain  6. DIAGNOSIS: When was the UTI diagnosed? By whom? Was it a kidney infection, bladder  infection or both?     6 Weeks Ago  7. ANTIBIOTIC: What antibiotic(s) are you taking? How many times per day?     Nitrofurantoin  (Macrobid ) BID     Cefdinir  previously (Caused GI Upset, Diarrhea)  8. ANTIBIOTIC - START DATE: When did you start taking the antibiotic? 07-20-2024  Protocols used: Urinary Tract Infection on Antibiotic Follow-up Call - [facility]

## 2024-07-29 ENCOUNTER — Encounter: Payer: Self-pay | Admitting: Physician Assistant

## 2024-07-30 ENCOUNTER — Ambulatory Visit: Payer: Self-pay | Admitting: Physician Assistant

## 2024-07-30 LAB — NUSWAB VAGINITIS PLUS (VG+)
Atopobium vaginae: HIGH {score} — AB
BVAB 2: HIGH {score} — AB
Candida albicans, NAA: NEGATIVE
Candida glabrata, NAA: NEGATIVE
Chlamydia trachomatis, NAA: NEGATIVE
Megasphaera 1: HIGH {score} — AB
Neisseria gonorrhoeae, NAA: NEGATIVE
Trich vag by NAA: NEGATIVE

## 2024-07-30 MED ORDER — METRONIDAZOLE 500 MG PO TABS: 500.0000 mg | ORAL_TABLET | Freq: Two times a day (BID) | ORAL | 0 refills | Status: DC

## 2024-07-30 NOTE — Progress Notes (Signed)
 You have lots of overgrowth of normal bacteria called bacterial vaginosis. You need to take metronidazole as well to treat. It was sent to the pharmacy.

## 2024-08-01 LAB — URINE CULTURE

## 2024-08-04 NOTE — Progress Notes (Signed)
 You did also have e.coli in urine culture. Cipro  should treat that as well as metrondiazole to treat bacterial vaginosis.

## 2024-08-12 ENCOUNTER — Encounter: Payer: Self-pay | Admitting: Emergency Medicine

## 2024-08-12 ENCOUNTER — Ambulatory Visit
Admission: EM | Admit: 2024-08-12 | Discharge: 2024-08-12 | Disposition: A | Discharge status: Home / Self Care | Payer: Self-pay | Attending: Family Medicine | Admitting: Family Medicine

## 2024-08-12 DIAGNOSIS — N390 Urinary tract infection, site not specified: Secondary | ICD-10-CM | POA: Insufficient documentation

## 2024-08-12 DIAGNOSIS — R1024 Suprapubic pain: Secondary | ICD-10-CM | POA: Insufficient documentation

## 2024-08-12 DIAGNOSIS — N3000 Acute cystitis without hematuria: Secondary | ICD-10-CM

## 2024-08-12 LAB — POCT URINALYSIS DIP (MANUAL ENTRY)
Bilirubin, UA: NEGATIVE
Blood, UA: NEGATIVE
Glucose, UA: NEGATIVE mg/dL
Ketones, POC UA: NEGATIVE mg/dL
Leukocytes, UA: NEGATIVE
Nitrite, UA: NEGATIVE
Protein Ur, POC: NEGATIVE mg/dL
Spec Grav, UA: <=1.005 — AB (ref 1.010–1.025)
Urobilinogen, UA: 0.2 U/dL
pH, UA: 6 (ref 5.0–8.0)

## 2024-08-12 MED ORDER — LIDOCAINE HCL 5 % EX GEL: 1.0000 "application " | Freq: Every day | CUTANEOUS | 0 refills | Status: DC | PRN

## 2024-08-12 MED ORDER — HYDROCODONE-ACETAMINOPHEN 5-325 MG PO TABS: 1.0000 | ORAL_TABLET | Freq: Three times a day (TID) | ORAL | 0 refills | Status: DC | PRN

## 2024-08-12 NOTE — ED Provider Notes (Signed)
 [facility]    CSN: [id number] Arrival date & time: 08/12/24  1618      History   Chief Complaint Chief Complaint  Patient presents with   Vaginal Pain    HPI Victoria Travis is a 58 y.o. female.   Patient is known to me from chronic recurring urinary tract infections.  She was just seen by her primary care doctor on 07/28/2024 for pelvic pain and urinary pain.  Testing revealed that she had both a urinary tract infection with a resistant E. coli, and BV.  She was treated with a course of Cipro  and metronidazole.  She completed these antibiotics.  She is here today complaining of severe vaginal pain.  She states it feels like a cramp in her vagina.  Denies suprapubic pain.  Denies dysuria or frequency.  Does not have any vaginal discharge or skin irritation.    Past Medical History:  Diagnosis Date   Alcohol dependence (HCC)    Depression    Hypothyroidism 06/11/2012   Hypothyroidism 06/11/2012   IBS (irritable bowel syndrome)    Thyroid  disease     Patient Active Problem List   Diagnosis Date Noted   Suprapubic pressure 07/28/2024   Frequent urination 07/28/2024   Dysuria 07/28/2024   Acute cystitis without hematuria 07/28/2024   Recurrent UTI (urinary tract infection) 07/28/2024   Elevated cholesterol 11/19/2023   Severe eczema 01/02/2023   Encounter for screening mammogram for malignant neoplasm of breast 01/02/2023   Cannabis abuse 02/15/2021   Menopausal syndrome 02/15/2021   Hypothyroidism due to Hashimoto's thyroiditis 06/17/2020   MDD (major depressive disorder), severe (HCC) 03/06/2018    History reviewed. No pertinent surgical history.  OB History   No obstetric history on file.      Home Medications    Prior to Admission medications   Medication Sig Start Date End Date Taking? Authorizing Provider  HYDROcodone -acetaminophen  (NORCO/VICODIN) 5-325 MG tablet Take 1-2 tablets by mouth every 8 (eight) hours as needed. 08/12/24  Yes Maranda Jamee Jacob, MD  Lidocaine  HCl 5 % GEL Apply 1 application  topically daily as needed. 08/12/24  Yes Maranda Jamee Jacob, MD  levothyroxine  (SYNTHROID ) 112 MCG tablet TAKE 1 TABLET BY MOUTH ONCE DAILY . 12/11/23   Jessup, Joy, NP  metroNIDAZOLE (FLAGYL) 500 MG tablet Take 1 tablet (500 mg total) by mouth 2 (two) times daily. 07/30/24   Antoniette Vermell CROME, PA-C    Family History Family History  Problem Relation Age of Onset   Healthy Mother    Heart attack Father        Triple Bypass with stents    Social History Social History   Tobacco Use   Smoking status: Every Day    Current packs/day: 0.50    Average packs/day: 0.5 packs/day for 4.8 years (2.4 ttl pk-yrs)    Types: Cigarettes    Start date: 10/10/2019   Smokeless tobacco: Never  Vaping Use   Vaping status: Never Used  Substance Use Topics   Alcohol use: Yes    Alcohol/week: 14.0 standard drinks of alcohol    Types: 14 Shots of liquor per week    Comment: 1/2 gallon vodka every 9 days - drinks daily - 4 large drinks   Drug use: Yes    Frequency: 1.0 times per week    Types: Marijuana     Allergies   Cefdinir    Review of Systems Review of Systems  See HPI Physical Exam Triage Vital Signs ED Triage Vitals  Encounter Vitals Group     BP 08/12/24 1631 (!) 139/98     Girls Systolic BP Percentile --      Girls Diastolic BP Percentile --      Boys Systolic BP Percentile --      Boys Diastolic BP Percentile --      Pulse Rate 08/12/24 1631 97     Resp 08/12/24 1631 17     Temp 08/12/24 1631 98.5 F (36.9 C)     Temp Source 08/12/24 1631 Oral     SpO2 08/12/24 1631 97 %     Weight --      Height --      Head Circumference --      Peak Flow --      Pain Score 08/12/24 1628 8     Pain Loc --      Pain Education --      Exclude from Growth Chart --    No data found.  Updated Vital Signs BP (!) 139/98 (BP Location: Right Arm)   Pulse 97   Temp 98.5 F (36.9 C) (Oral)   Resp 17   LMP 02/15/2021 (Exact Date)    SpO2 97%     Physical Exam Constitutional:      General: She is in acute distress.     Appearance: She is well-developed.     Comments: Crying loudly in exam room  HENT:     Head: Normocephalic and atraumatic.  Eyes:     Conjunctiva/sclera: Conjunctivae normal.     Pupils: Pupils are equal, round, and reactive to light.  Cardiovascular:     Rate and Rhythm: Normal rate.  Pulmonary:     Effort: Pulmonary effort is normal. No respiratory distress.  Abdominal:     General: There is no distension.     Palpations: Abdomen is soft.     Tenderness: There is no abdominal tenderness.  Musculoskeletal:        General: Normal range of motion.     Cervical back: Normal range of motion.  Skin:    General: Skin is warm and dry.  Neurological:     Mental Status: She is alert.      UC Treatments / Results  Labs (all labs ordered are listed, but only abnormal results are displayed) Labs Reviewed  POCT URINALYSIS DIP (MANUAL ENTRY) - Abnormal; Notable for the following components:      Result Value   Spec Grav, UA <=1.005 (*)    All other components within normal limits  URINE CULTURE  CERVICOVAGINAL ANCILLARY ONLY    EKG   Radiology No results found.  Procedures Procedures (including critical care time)  Medications Ordered in UC Medications - No data to display  Initial Impression / Assessment and Plan / UC Course  I have reviewed the triage vital signs and the nursing notes.  Pertinent labs & imaging results that were available during my care of the patient were reviewed by me and considered in my medical decision making (see chart for details).     I explained to the patient that I can repeat her infection testing to make sure her infections have cleared.  Her urinalysis is normal today.  She does not have symptoms of a bladder infection or vaginitis that would require treatment pending test results I am giving the patient lidocaine  to try to use for topical pain  relief.  Also giving her prescription for pain medication to try to help with her overall discomfort.  I have placed an urgent referral to the GYN office for consultation In the meantime follow-up with PCP Final Clinical Impressions(s) / UC Diagnoses   Final diagnoses:  Acute cystitis without hematuria  Suprapubic pressure     Discharge Instructions      May take Tylenol  or ibuprofen  for moderate pain May take hydrocodone  if needed for severe pain Do not drive on hydrocodone  I have provided a lidocaine  jelly to numb the area.  You can apply this as needed I am placing referral to GYN for an urgent follow-up   ED Prescriptions     Medication Sig Dispense Auth. Provider   Lidocaine  HCl 5 % GEL Apply 1 application  topically daily as needed. 85 g Maranda Jamee Jacob, MD   HYDROcodone -acetaminophen  (NORCO/VICODIN) 5-325 MG tablet Take 1-2 tablets by mouth every 8 (eight) hours as needed. 20 tablet Maranda Jamee Jacob, MD      I have reviewed the PDMP during this encounter.   Maranda Jamee Jacob, MD 08/12/24 [phone]

## 2024-08-12 NOTE — ED Triage Notes (Addendum)
 Pt presents to UC with c/o vaginal pain. Describes pain as a cramping sensation. States she has been c/o of the same symptoms for awhile. States just finished abx prescribed 2/3 ago. Denies being sexually active.

## 2024-08-12 NOTE — Discharge Instructions (Signed)
 May take Tylenol  or ibuprofen  for moderate pain May take hydrocodone  if needed for severe pain Do not drive on hydrocodone  I have provided a lidocaine  jelly to numb the area.  You can apply this as needed I am placing referral to GYN for an urgent follow-up

## 2024-08-13 ENCOUNTER — Ambulatory Visit: Payer: Self-pay

## 2024-08-13 NOTE — Telephone Encounter (Signed)
 FYI Only or Action Required?: Action required by provider: clinical question for provider and requesting a call back from the office .  Patient was last seen in primary care on 07/28/2024 by Antoniette Vermell CROME, PA-C.  Called Nurse Triage reporting Pelvic Pain.  Symptoms began several weeks ago.  Interventions attempted: Rest, hydration, or home remedies.  Symptoms are: unchanged.  Triage Disposition: See HCP Within 4 Hours (Or PCP Triage)  Patient/caregiver understands and will follow disposition?: No, refuses disposition  Copied from CRM #[id number]. Topic: Clinical - Red Word Triage >> Aug 13, 2024  8:32 AM Avram MATSU wrote: Red Word that prompted transfer to Nurse Triage: went to urgent care and pt has pelvic pain and was given pain but stated she needs antibiotics Reason for Disposition  [1] MILD-MODERATE pain AND [2] constant AND [3] present > 2 hours  Answer Assessment - Initial Assessment Questions Patient states she has been on a couple of rounds of antibiotics but pain hasn't gone away. Patient is asking for another antibiotic. Patient refused appointment. Patient is asking for a call back today from office.   1. LOCATION: Where does it hurt?      Pelvic area-patient reports pressure to the area 2. RADIATION: Does the pain shoot anywhere else? (e.g., lower back, groin, thighs)     No radiation 3. ONSET: When did the pain begin? (e.g., minutes, hours or days ago)      Has been going on for a couple of weeks 4. SUDDEN: Gradual or sudden onset?     gradual 5. PATTERN Does the pain come and go, or is it constant?     constant 6. SEVERITY: How bad is the pain?  (e.g., Scale 1-10; mild, moderate, or severe)     9 7. RECURRENT SYMPTOM: Have you ever had this type of pelvic pain before? If Yes, ask: When was the last time? and What happened that time?      yes 8. CAUSE: What do you think is causing the pelvic pain?     unsure 9. RELIEVING/AGGRAVATING FACTORS:  What makes it better or worse? (e.g., activity/rest, sexual intercourse, voiding, passing stool)     Nothing that the patient has found makes it better or worse 10. OTHER SYMPTOMS: Has there been any other symptoms? (e.g., fever, constipation, diarrhea, urine problems, vaginal bleeding, vaginal discharge, or vomiting?       Vaginal discharge  Protocols used: Pelvic Pain - Female-A-AH

## 2024-08-13 NOTE — Telephone Encounter (Signed)
 The patient was seen in UC on 08/12/24 for the following symptoms. The patient has declined an appointment with the provider. She is requesting another course of antibiotics. Please advise.

## 2024-08-14 ENCOUNTER — Encounter: Payer: Self-pay | Admitting: Obstetrics and Gynecology

## 2024-08-14 ENCOUNTER — Ambulatory Visit (INDEPENDENT_AMBULATORY_CARE_PROVIDER_SITE_OTHER): Payer: Self-pay | Admitting: Obstetrics and Gynecology

## 2024-08-14 ENCOUNTER — Telehealth: Payer: Self-pay

## 2024-08-14 VITALS — BP 150/92 | HR 106 | Ht 65.0 in | Wt 127.0 lb

## 2024-08-14 DIAGNOSIS — N390 Urinary tract infection, site not specified: Secondary | ICD-10-CM

## 2024-08-14 DIAGNOSIS — R102 Pelvic and perineal pain unspecified side: Secondary | ICD-10-CM

## 2024-08-14 LAB — CERVICOVAGINAL ANCILLARY ONLY
Bacterial Vaginitis (gardnerella): NEGATIVE
Candida Glabrata: NEGATIVE
Candida Vaginitis: NEGATIVE
Comment: NEGATIVE
Comment: NEGATIVE
Comment: NEGATIVE

## 2024-08-14 LAB — URINE CULTURE
Culture: NO GROWTH
Special Requests: NORMAL

## 2024-08-14 MED ORDER — ESTRADIOL 0.01 % VA CREA: TOPICAL_CREAM | VAGINAL | 12 refills | Status: AC

## 2024-08-14 MED ORDER — NITROFURANTOIN MONOHYD MACRO 100 MG PO CAPS: 100.0000 mg | ORAL_CAPSULE | Freq: Two times a day (BID) | ORAL | 6 refills | Status: DC

## 2024-08-14 MED ORDER — FLUCONAZOLE 150 MG PO TABS: 150.0000 mg | ORAL_TABLET | ORAL | 3 refills | Status: DC

## 2024-08-14 NOTE — Progress Notes (Signed)
 Last pap 2016. Mamm ordered 3/25. Did not keep appt.

## 2024-08-14 NOTE — Progress Notes (Signed)
 GYNECOLOGY OFFICE VISIT NOTE  History:  Victoria Travis is a 58 y.o. G1P0 here today for pelvic pain.   She has a h/o chronic UTI. Her last UTI was on 10/20 - E. Coli sensitive to macrobid , but resistant to Gent, Bactrim , ampicillin.  She was treated for this as well as BV.   On 11/4, she went to the urgent care. I reviewed their note. They repeated her urine culture and vaginitis panel. She was given topical lidocaine . She was also given 20 tablets of norco.   I independently reviewed: 10/20: Urine culture as noted above.  NuSwab from 10/20, positive for BV.  10/12: Ucx E. Coli, sensi to bactrim  8/24: Ucx E. Coli, pansensi   Her Ucx and vaginitis panel from 11/4 are still not back yet.   Done medication for UTI suppression? No Seen a urologist? No Tried vaginal estrogen? No   Past Medical History:  Diagnosis Date   Alcohol dependence (HCC)    Depression    Hypothyroidism 06/11/2012   Hypothyroidism 06/11/2012   IBS (irritable bowel syndrome)    Thyroid  disease     History reviewed. No pertinent surgical history.  The following portions of the patient's history were reviewed and updated as appropriate: allergies, current medications, past family history, past medical history, past social history, past surgical history and problem list.   Health Maintenance:   Overdue for pap and mammogram.   Review of Systems:  Pertinent items noted in HPI and remainder of comprehensive ROS otherwise negative.  Physical Exam:  BP (!) 150/92   Pulse (!) 106   Ht 5' 5 (1.651 m)   Wt 127 lb (57.6 kg)   LMP 02/15/2021 (Exact Date)   BMI 21.13 kg/m  CONSTITUTIONAL: Well-developed, well-nourished female in no acute distress.  HEENT:  Normocephalic, atraumatic. External right and left ear normal. No scleral icterus.  NECK: Normal range of motion, supple, no masses noted on observation SKIN: No rash noted. Not diaphoretic. No erythema. No pallor. MUSCULOSKELETAL: Normal range of motion.  No edema noted. NEUROLOGIC: Alert and oriented to person, place, and time. Normal muscle tone coordination. No cranial nerve deficit noted. PSYCHIATRIC: Normal mood and affect. Normal behavior. Normal judgment and thought content.  PELVIC: Normal appearing external genitalia; normal urethral meatus; normal appearing vaginal mucosa and cervix.  No abnormal discharge noted.  Normal uterine size, no other palpable masses, no uterine or adnexal tenderness. Performed in the presence of a chaperone  Labs and Imaging Results for orders placed or performed during the hospital encounter of 08/12/24 (from the past week)  POCT urinalysis dipstick   Collection Time: 08/12/24  5:01 PM  Result Value Ref Range   Color, UA yellow yellow   Clarity, UA clear clear   Glucose, UA negative negative mg/dL   Bilirubin, UA negative negative   Ketones, POC UA negative negative mg/dL   Spec Grav, UA <=8.994 (A) 1.010 - 1.025   Blood, UA negative negative   pH, UA 6.0 5.0 - 8.0   Protein Ur, POC negative negative mg/dL   Urobilinogen, UA 0.2 0.2 or 1.0 E.U./dL   Nitrite, UA Negative Negative   Leukocytes, UA Negative Negative   No results found.  Assessment and Plan:  1. Pelvic pain (Primary) - Would also await culture  results - Recommend UTI suppression vs vaginal estrogen. While I think VagE will help, because she does not have insurance macrobid  suppression may be more affordable.  - I can prescribe both and she can see  which works best through [employer].  - Will refer to [facility] for pap/mammo   2. Recurrent UTI (urinary tract infection) -     estradiol (ESTRACE) 0.01 % CREA vaginal cream; Apply 1 gram per vagina every night for 2 weeks, then apply 2-3 times a week -     nitrofurantoin , macrocrystal-monohydrate, (MACROBID ) 100 MG capsule; Take 1 capsule (100 mg total) by mouth 2 (two) times daily.     Meds ordered this encounter  Medications   estradiol (ESTRACE) 0.01 % CREA vaginal cream    Sig:  Apply 1 gram per vagina every night for 2 weeks, then apply 2-3 times a week    Dispense:  30 g    Refill:  12   nitrofurantoin , macrocrystal-monohydrate, (MACROBID ) 100 MG capsule    Sig: Take 1 capsule (100 mg total) by mouth 2 (two) times daily.    Dispense:  30 capsule    Refill:  6   fluconazole  (DIFLUCAN ) 150 MG tablet    Sig: Take 1 tablet (150 mg total) by mouth every 3 (three) days. For three doses    Dispense:  3 tablet    Refill:  3     Routine preventative health maintenance measures emphasized. Please refer to After Visit Summary for other counseling recommendations.   Return if symptoms worsen or fail to improve.  Vina Solian, MD, FACOG Obstetrician & Gynecologist, [facility] for [facility], [facility]

## 2024-08-14 NOTE — Telephone Encounter (Signed)
 The pt's pharmacy called stating that lidocaine  5% gel is no longer made. They state they do have it in a cream, ointment, or patch. The provider here is notified and states to have patient follow up with her PCP. When the patient is called, she states that she saw her gynecologist this morning who prescribed her medications but has not picked them up yet. She states that the hydrocodone  prescribed has not helped. She is willing to wait and see if meds prescribed by Gyn will help.

## 2024-08-15 NOTE — Telephone Encounter (Signed)
 This task has been completed as requested. The patient was notified of the update. Per the patient - she had a visit with OB/GYN on 08/14/24. She continues to be in excruciating pelvic pain. The patient is very upset and stated that there is a breakdown of communication between the UC/GYN/Provider. A vaginal swab was done during her visit at [facility], however the patient was not notified of the results. According to the patient - the OB/GYN provider was unable to access the most recent results for testing. No additional tests were ordered by the OB/GYN since patient had been swabbed recently. The patient also mentioned she is taking a medication that she feels she shouldn't be taking. She is demanding that someone gives her answers and that she gets help in figuring out her pelvic pain. Per the patient, we are dropping the ball on her care.

## 2024-08-27 ENCOUNTER — Telehealth: Payer: Self-pay

## 2024-08-27 NOTE — Telephone Encounter (Signed)
 Telephoned patient at mobile number. Left a voice message with [facility] contact information.

## 2024-08-27 NOTE — Telephone Encounter (Signed)
-----   Message from Nurse Wanda B sent at 08/22/2024  4:44 PM EST ----- Regarding: FW: Scheduling Please schedule with [facility] if eligible. ----- Message ----- From: Jacki Ronelle CROME, NT Sent: 08/14/2024   8:56 AM EST To: Wanda SHAUNNA Caves, RN Subject: Scheduling                                     Good morning. Please contact patient for scheduling if she qualifies for the [facility] program. Patient is uninsured at this time. Thank you.

## 2024-08-29 NOTE — Telephone Encounter (Signed)
 Telephoned patient at mobile number. Patient stated she has enrolled with private insurance coverage. [facility]

## 2024-09-05 ENCOUNTER — Telehealth: Payer: Self-pay

## 2024-09-05 ENCOUNTER — Ambulatory Visit: Payer: Self-pay | Admitting: Medical-Surgical

## 2024-09-05 ENCOUNTER — Ambulatory Visit
Admission: EM | Admit: 2024-09-05 | Discharge: 2024-09-05 | Disposition: A | Discharge status: Home / Self Care | Payer: Self-pay | Attending: Internal Medicine | Admitting: Internal Medicine

## 2024-09-05 DIAGNOSIS — Z113 Encounter for screening for infections with a predominantly sexual mode of transmission: Secondary | ICD-10-CM | POA: Insufficient documentation

## 2024-09-05 DIAGNOSIS — R300 Dysuria: Secondary | ICD-10-CM | POA: Insufficient documentation

## 2024-09-05 DIAGNOSIS — R102 Pelvic and perineal pain unspecified side: Secondary | ICD-10-CM | POA: Insufficient documentation

## 2024-09-05 LAB — POCT URINE DIPSTICK
Bilirubin, UA: NEGATIVE
Blood, UA: NEGATIVE
Glucose, UA: NEGATIVE mg/dL
Leukocytes, UA: NEGATIVE
Nitrite, UA: NEGATIVE
POC PROTEIN,UA: NEGATIVE
Spec Grav, UA: 1.01 (ref 1.010–1.025)
Urobilinogen, UA: 0.2 U/dL
pH, UA: 6 (ref 5.0–8.0)

## 2024-09-05 NOTE — Telephone Encounter (Signed)
 [facility] for option for repeat UA. In house was normal but provider was going to send [facility] for recheck. Mix up in lab and urine had already been tossed so no culture has been collected. If pt still wants UCX, would need to return for recollect.

## 2024-09-05 NOTE — Discharge Instructions (Signed)
 Follow-up with your established OB/GYN or provided contact for urogynecology for further evaluation and management.  Go to the ER if symptoms significantly worsen.  Vaginal swab and urine culture pending.  We will call if they are abnormal.

## 2024-09-05 NOTE — ED Provider Notes (Addendum)
 [name]    CSN: [id number] Arrival date & time: 09/05/24  1657      History   Chief Complaint Chief Complaint  Patient presents with   Pelvic Pain    HPI Victoria Travis is a 58 y.o. female.   Patient presents with persistent pelvic pain, cramping, dysuria, urinary frequency that has been present for 1 to 2 months.  Patient has been seen multiple times at this urgent care, by PCP, and by OB/GYN for the same symptoms.  She has had several urinalysis, urine culture, vaginal swabs.  She has been treated for bacterial vaginosis and urinary tract infection with no improvement in her symptoms.  She had pelvic exam completed by OB/GYN which was unremarkable per patient chart.  She has also been treated with topical lidocaine  and Norco with no improvement.  She has also been treated with topical estradiol  cream. she is sexually active but denies exposure to STD.  Patient is not reporting any new vaginal discharge.   Pelvic Pain    Past Medical History:  Diagnosis Date   Alcohol dependence (HCC)    Depression    Hypothyroidism 06/11/2012   Hypothyroidism 06/11/2012   IBS (irritable bowel syndrome)    Thyroid  disease     Patient Active Problem List   Diagnosis Date Noted   Suprapubic pressure 07/28/2024   Frequent urination 07/28/2024   Dysuria 07/28/2024   Acute cystitis without hematuria 07/28/2024   Recurrent UTI (urinary tract infection) 07/28/2024   Elevated cholesterol 11/19/2023   Severe eczema 01/02/2023   Encounter for screening mammogram for malignant neoplasm of breast 01/02/2023   Cannabis abuse 02/15/2021   Menopausal syndrome 02/15/2021   Hypothyroidism due to Hashimoto's thyroiditis 06/17/2020   MDD (major depressive disorder), severe (HCC) 03/06/2018    History reviewed. No pertinent surgical history.  OB History     Gravida  1   Para      Term      Preterm      AB      Living         SAB      IAB      Ectopic      Multiple       Live Births               Home Medications    Prior to Admission medications   Medication Sig Start Date End Date Taking? Authorizing Provider  estradiol  (ESTRACE ) 0.01 % CREA vaginal cream Apply 1 gram per vagina every night for 2 weeks, then apply 2-3 times a week 08/14/24   Cleatus Moccasin, MD  fluconazole  (DIFLUCAN ) 150 MG tablet Take 1 tablet (150 mg total) by mouth every 3 (three) days. For three doses 08/14/24   Cleatus Moccasin, MD  HYDROcodone -acetaminophen  (NORCO/VICODIN) 5-325 MG tablet Take 1-2 tablets by mouth every 8 (eight) hours as needed. 08/12/24   Maranda Jamee Jacob, MD  levothyroxine  (SYNTHROID ) 112 MCG tablet TAKE 1 TABLET BY MOUTH ONCE DAILY . 12/11/23   Jessup, Joy, NP  Lidocaine  HCl 5 % GEL Apply 1 application  topically daily as needed. 08/12/24   Maranda Jamee Jacob, MD  nitrofurantoin , macrocrystal-monohydrate, (MACROBID ) 100 MG capsule Take 1 capsule (100 mg total) by mouth 2 (two) times daily. 08/14/24   Cleatus Moccasin, MD    Family History Family History  Problem Relation Age of Onset   Healthy Mother    Heart attack Father        Triple Bypass with stents  Social History Social History   Tobacco Use   Smoking status: Every Day    Current packs/day: 0.50    Average packs/day: 0.5 packs/day for 4.9 years (2.5 ttl pk-yrs)    Types: Cigarettes    Start date: 10/10/2019   Smokeless tobacco: Never  Vaping Use   Vaping status: Never Used  Substance Use Topics   Alcohol use: Yes    Alcohol/week: 14.0 standard drinks of alcohol    Types: 14 Shots of liquor per week    Comment: 1/2 gallon vodka every 9 days - drinks daily - 4 large drinks   Drug use: Yes    Frequency: 1.0 times per week    Types: Marijuana     Allergies   Cefdinir    Review of Systems Review of Systems Per HPI  Physical Exam Triage Vital Signs ED Triage Vitals  Encounter Vitals Group     BP 09/05/24 1757 (!) 163/97     Girls Systolic BP Percentile --      Girls Diastolic  BP Percentile --      Boys Systolic BP Percentile --      Boys Diastolic BP Percentile --      Pulse Rate 09/05/24 1757 (!) 121     Resp 09/05/24 1757 17     Temp 09/05/24 1757 97.7 F (36.5 C)     Temp Source 09/05/24 1757 Oral     SpO2 09/05/24 1757 97 %     Weight --      Height --      Head Circumference --      Peak Flow --      Pain Score 09/05/24 1758 9     Pain Loc --      Pain Education --      Exclude from Growth Chart --    No data found.  Updated Vital Signs BP (!) 163/97 (BP Location: Right Arm)   Pulse (!) 121   Temp 97.7 F (36.5 C) (Oral)   Resp 17   LMP 02/15/2021 (Exact Date)   SpO2 97%   Visual Acuity Right Eye Distance:   Left Eye Distance:   Bilateral Distance:    Right Eye Near:   Left Eye Near:    Bilateral Near:     Physical Exam Constitutional:      General: She is not in acute distress.    Appearance: Normal appearance. She is not toxic-appearing or diaphoretic.     Comments: Patient is tearful  HENT:     Head: Normocephalic and atraumatic.  Eyes:     Extraocular Movements: Extraocular movements intact.     Conjunctiva/sclera: Conjunctivae normal.  Pulmonary:     Effort: Pulmonary effort is normal.  Genitourinary:    Comments: Deferred with shared decision making.  Self swab performed. Neurological:     General: No focal deficit present.     Mental Status: She is alert and oriented to person, place, and time. Mental status is at baseline.  Psychiatric:        Mood and Affect: Mood normal.        Behavior: Behavior normal.        Thought Content: Thought content normal.        Judgment: Judgment normal.      UC Treatments / Results  Labs (all labs ordered are listed, but only abnormal results are displayed) Labs Reviewed  POCT URINE DIPSTICK - Abnormal; Notable for the following components:      Result  Value   Ketones, POC UA small (15) (*)    All other components within normal limits  URINE CULTURE  CERVICOVAGINAL  ANCILLARY ONLY    EKG   Radiology No results found.  Procedures Procedures (including critical care time)  Medications Ordered in UC Medications - No data to display  Initial Impression / Assessment and Plan / UC Course  I have reviewed the triage vital signs and the nursing notes.  Pertinent labs & imaging results that were available during my care of the patient were reviewed by me and considered in my medical decision making (see chart for details).     UA unremarkable.  Will send urine culture to confirm.  Cervicovaginal swab repeated.  Pelvic exam deferred given patient had normal pelvic exam at OB/GYN.  Advised patient that she needs to follow-up with  OBGYN for any additional testing and evaluation given we have exhausted our limited resources at urgent care.  Also provided patient with urogynecology contact information if she wishes to follow-up with them.  Patient's blood pressure and heart rate are elevated but she is visibly upset in exam room so do not think that additional workup is necessary for this. Patient expresses concern that no diagnosis has been made for her pelvic pain. Patient was also given strict ER precautions and educated that urgent care does not have any additional resources to evaluate this specific pelvic pain at this time.  Patient verbalized understanding and was agreeable with plan.  Urine culture was accidentally discarded by clinical staff.  They attempted to call her to have her return to obtain a new sample to send for urine culture  Clinical staff state that they left a voicemail for call back as she did not answer the phone. Final Clinical Impressions(s) / UC Diagnoses   Final diagnoses:  Pelvic pain in female  Dysuria  Screening examination for venereal disease     Discharge Instructions      Follow-up with your established OB/GYN or provided contact for urogynecology for further evaluation and management.  Go to the ER if symptoms  significantly worsen.  Vaginal swab and urine culture pending.  We will call if they are abnormal.    ED Prescriptions   None    PDMP not reviewed this encounter.   Hazen Darryle BRAVO, [place] 09/05/24 1918    Hazen Darryle BRAVO, [place] 09/05/24 1919

## 2024-09-05 NOTE — ED Triage Notes (Signed)
 Pt c/o pelvic pain and cramping for a month and a half. Was seen on 11/4, tx with macrobid .

## 2024-09-06 ENCOUNTER — Telehealth: Payer: Self-pay | Admitting: Internal Medicine

## 2024-09-06 ENCOUNTER — Telehealth: Payer: Self-pay

## 2024-09-06 NOTE — Telephone Encounter (Signed)
 Pt called back this morning. Informed needs recollect for UCX. Working today and tomorrow (Sunday) so will not be able to return until Monday. Informed no need to check in for visit. Just leave urine specimen and we will send it off. Pt verbalized understanding.

## 2024-09-06 NOTE — Telephone Encounter (Signed)
 Patient's urine sample was mistakenly discarded at her visit yesterday prior to collecting urine culture. Patient was called by clinical staff to return to recollect. Placing order for urine culture for when patient returns.

## 2024-09-08 LAB — CERVICOVAGINAL ANCILLARY ONLY
Bacterial Vaginitis (gardnerella): POSITIVE — AB
Candida Glabrata: POSITIVE — AB
Candida Vaginitis: NEGATIVE
Chlamydia: NEGATIVE
Comment: NEGATIVE
Comment: NEGATIVE
Comment: NEGATIVE
Comment: NEGATIVE
Comment: NEGATIVE
Comment: NORMAL
Neisseria Gonorrhea: NEGATIVE
Trichomonas: NEGATIVE

## 2024-09-09 ENCOUNTER — Ambulatory Visit: Payer: Self-pay | Admitting: Internal Medicine

## 2024-09-09 MED ORDER — BORIC ACID VAGINAL 600 MG VA SUPP: 1.0000 | Freq: Every day | VAGINAL | 0 refills | Status: AC

## 2024-09-09 MED ORDER — METRONIDAZOLE 500 MG PO TABS: 500.0000 mg | ORAL_TABLET | Freq: Two times a day (BID) | ORAL | 0 refills | Status: DC

## 2024-10-22 ENCOUNTER — Ambulatory Visit
Admission: EM | Admit: 2024-10-22 | Discharge: 2024-10-22 | Disposition: A | Discharge status: Home / Self Care | Attending: Family Medicine | Admitting: Family Medicine

## 2024-10-22 ENCOUNTER — Encounter: Payer: Self-pay | Admitting: Emergency Medicine

## 2024-10-22 DIAGNOSIS — N390 Urinary tract infection, site not specified: Secondary | ICD-10-CM | POA: Diagnosis not present

## 2024-10-22 DIAGNOSIS — R300 Dysuria: Secondary | ICD-10-CM | POA: Diagnosis not present

## 2024-10-22 DIAGNOSIS — R030 Elevated blood-pressure reading, without diagnosis of hypertension: Secondary | ICD-10-CM | POA: Diagnosis not present

## 2024-10-22 LAB — POCT URINALYSIS DIP (MANUAL ENTRY)
Blood, UA: NEGATIVE
Glucose, UA: NEGATIVE mg/dL
Leukocytes, UA: NEGATIVE
Nitrite, UA: NEGATIVE
Protein Ur, POC: 30 mg/dL — AB
Spec Grav, UA: 1.02
Urobilinogen, UA: 1 U/dL
pH, UA: 7.5

## 2024-10-22 NOTE — ED Provider Notes (Signed)
 [facility]    CSN: [id number] Arrival date & time: 10/22/24  0912      History   Chief Complaint Chief Complaint  Patient presents with   Urinary Frequency    HPI Victoria Travis is a 59 y.o. female.   HPI   Patient is back with urinary symptoms.  She feels like she has another urinary tract infection.  Unfortunately, the urinalysis does not reveal findings consistent with a UTI.  She is already using estradiol  for recurring UTI.  Also feels like she has recurring vaginitis.  Has had BV recently.  I have recommended a referral to the urogynecologist for her recurring problems.  Past Medical History:  Diagnosis Date   Alcohol dependence (HCC)    Depression    Hypothyroidism 06/11/2012   Hypothyroidism 06/11/2012   IBS (irritable bowel syndrome)    Thyroid  disease     Patient Active Problem List   Diagnosis Date Noted   Suprapubic pressure 07/28/2024   Frequent urination 07/28/2024   Dysuria 07/28/2024   Acute cystitis without hematuria 07/28/2024   Recurrent UTI (urinary tract infection) 07/28/2024   Elevated cholesterol 11/19/2023   Severe eczema 01/02/2023   Encounter for screening mammogram for malignant neoplasm of breast 01/02/2023   Cannabis abuse 02/15/2021   Menopausal syndrome 02/15/2021   Hypothyroidism due to Hashimoto's thyroiditis 06/17/2020   MDD (major depressive disorder), severe (HCC) 03/06/2018    History reviewed. No pertinent surgical history.  OB History     Gravida  1   Para      Term      Preterm      AB      Living         SAB      IAB      Ectopic      Multiple      Live Births               Home Medications    Prior to Admission medications  Medication Sig Start Date End Date Taking? Authorizing Provider  estradiol  (ESTRACE ) 0.01 % CREA vaginal cream Apply 1 gram per vagina every night for 2 weeks, then apply 2-3 times a week 08/14/24   Cleatus Moccasin, MD  levothyroxine  (SYNTHROID ) 112 MCG tablet  TAKE 1 TABLET BY MOUTH ONCE DAILY . 12/11/23   Willo Mini, NP    Family History Family History  Problem Relation Age of Onset   Healthy Mother    Heart attack Father        Triple Bypass with stents    Social History Social History[1]   Allergies   Cefdinir    Review of Systems Review of Systems See HPI  Physical Exam Triage Vital Signs ED Triage Vitals  Encounter Vitals Group     BP 10/22/24 0939 (!) 154/110     Girls Systolic BP Percentile --      Girls Diastolic BP Percentile --      Boys Systolic BP Percentile --      Boys Diastolic BP Percentile --      Pulse Rate 10/22/24 0939 88     Resp --      Temp 10/22/24 0939 98 F (36.7 C)     Temp Source 10/22/24 0939 Oral     SpO2 10/22/24 0939 99 %     Weight --      Height --      Head Circumference --      Peak Flow --  Pain Score 10/22/24 0941 2     Pain Loc --      Pain Education --      Exclude from Growth Chart --    No data found.  Updated Vital Signs BP (!) 170/102 (BP Location: Right Arm)   Pulse 88   Temp 98 F (36.7 C) (Oral)   LMP 02/15/2021   SpO2 99%      Physical Exam Constitutional:      General: She is not in acute distress.    Appearance: She is well-developed.  HENT:     Head: Normocephalic and atraumatic.  Eyes:     Conjunctiva/sclera: Conjunctivae normal.     Pupils: Pupils are equal, round, and reactive to light.  Cardiovascular:     Rate and Rhythm: Normal rate.  Pulmonary:     Effort: Pulmonary effort is normal. No respiratory distress.  Musculoskeletal:        General: Normal range of motion.     Cervical back: Normal range of motion.  Skin:    General: Skin is warm and dry.  Neurological:     Mental Status: She is alert.      UC Treatments / Results  Labs (all labs ordered are listed, but only abnormal results are displayed) Labs Reviewed  POCT URINALYSIS DIP (MANUAL ENTRY) - Abnormal; Notable for the following components:      Result Value   Clarity,  UA cloudy (*)    Bilirubin, UA small (*)    Ketones, POC UA moderate (40) (*)    Protein Ur, POC =30 (*)    All other components within normal limits  URINE CULTURE  CERVICOVAGINAL ANCILLARY ONLY    EKG   Radiology No results found.  Procedures Procedures (including critical care time)  Medications Ordered in UC Medications - No data to display  Initial Impression / Assessment and Plan / UC Course  I have reviewed the triage vital signs and the nursing notes.  Pertinent labs & imaging results that were available during my care of the patient were reviewed by me and considered in my medical decision making (see chart for details).     Specimens are sent to laboratory.  Will await and treat based on her findings. Referral placed Final Clinical Impressions(s) / UC Diagnoses   Final diagnoses:  Dysuria  Recurrent urinary tract infection  Elevated blood pressure reading     Discharge Instructions      I have sent the urine to the laboratory for culture.  You will be called if any antibiotics are indicated I have sent the vaginal swab for testing. Your results are available on MyChart. A nurse will call you if any of your tests are positive I have placed a referral to urogynecology.  You should hear from their office Follow-up with Zada Palin regarding your blood pressure   ED Prescriptions   None    PDMP not reviewed this encounter.    [1]  Social History Tobacco Use   Smoking status: Every Day    Current packs/day: 0.50    Average packs/day: 0.5 packs/day for 5.0 years (2.5 ttl pk-yrs)    Types: Cigarettes    Start date: 10/10/2019   Smokeless tobacco: Never  Vaping Use   Vaping status: Never Used  Substance Use Topics   Alcohol use: Yes    Alcohol/week: 14.0 standard drinks of alcohol    Types: 14 Shots of liquor per week    Comment: 1/2 gallon vodka every 9  days - drinks daily - 4 large drinks   Drug use: Yes    Frequency: 1.0 times per week     Types: Marijuana     Maranda Jamee Jacob, MD 10/22/24 1335  "

## 2024-10-22 NOTE — Discharge Instructions (Addendum)
 I have sent the urine to the laboratory for culture.  You will be called if any antibiotics are indicated I have sent the vaginal swab for testing. Your results are available on MyChart. A nurse will call you if any of your tests are positive I have placed a referral to urogynecology.  You should hear from their office Follow-up with Victoria Travis regarding your blood pressure

## 2024-10-22 NOTE — ED Triage Notes (Signed)
 Pt c/o urinary frequency, urgency and pelvic pressure x1 week. Denies dysuria. States she has had several UTI in the past 6 months. She just finished abx about 2 weeks ago for similar symptoms.

## 2024-10-23 ENCOUNTER — Ambulatory Visit (HOSPITAL_COMMUNITY): Payer: Self-pay

## 2024-10-23 LAB — CERVICOVAGINAL ANCILLARY ONLY
Bacterial Vaginitis (gardnerella): POSITIVE — AB
Candida Glabrata: NEGATIVE
Candida Vaginitis: NEGATIVE
Chlamydia: NEGATIVE
Comment: NEGATIVE
Comment: NEGATIVE
Comment: NEGATIVE
Comment: NEGATIVE
Comment: NEGATIVE
Comment: NORMAL
Neisseria Gonorrhea: NEGATIVE
Trichomonas: NEGATIVE

## 2024-10-23 LAB — URINE CULTURE
Culture: NO GROWTH
Special Requests: NORMAL

## 2024-10-23 MED ORDER — METRONIDAZOLE 500 MG PO TABS: 500.0000 mg | ORAL_TABLET | Freq: Two times a day (BID) | ORAL | 0 refills | Status: AC

## 2024-12-15 ENCOUNTER — Encounter: Payer: Self-pay | Admitting: Medical-Surgical
